# Patient Record
Sex: Male | Born: 1955
Health system: Southern US, Community
[De-identification: ages and names within clinical notes are randomized; demographics above are authoritative.]

## PROBLEM LIST (undated history)

## (undated) DIAGNOSIS — R911 Solitary pulmonary nodule: Secondary | ICD-10-CM

## (undated) DIAGNOSIS — E119 Type 2 diabetes mellitus without complications: Secondary | ICD-10-CM

## (undated) DIAGNOSIS — R7302 Impaired glucose tolerance (oral): Secondary | ICD-10-CM

## (undated) DIAGNOSIS — I214 Non-ST elevation (NSTEMI) myocardial infarction: Secondary | ICD-10-CM

## (undated) DIAGNOSIS — I2510 Atherosclerotic heart disease of native coronary artery without angina pectoris: Secondary | ICD-10-CM

## (undated) DIAGNOSIS — G43909 Migraine, unspecified, not intractable, without status migrainosus: Secondary | ICD-10-CM

## (undated) DIAGNOSIS — I10 Essential (primary) hypertension: Secondary | ICD-10-CM

---

## 2005-08-02 HISTORY — PX: CHOLECYSTECTOMY: SHX55

## 2007-08-04 ENCOUNTER — Observation Stay (HOSPITAL_COMMUNITY): Admission: EM | Admit: 2007-08-04 | Discharge: 2007-08-05 | Payer: Self-pay | Admitting: Emergency Medicine

## 2008-10-04 ENCOUNTER — Ambulatory Visit: Payer: Self-pay | Admitting: Cardiology

## 2008-10-15 ENCOUNTER — Ambulatory Visit: Payer: Self-pay | Admitting: Cardiology

## 2009-09-04 ENCOUNTER — Inpatient Hospital Stay (HOSPITAL_COMMUNITY): Admission: EM | Admit: 2009-09-04 | Discharge: 2009-09-09 | Payer: Self-pay | Admitting: Emergency Medicine

## 2009-09-08 ENCOUNTER — Encounter (INDEPENDENT_AMBULATORY_CARE_PROVIDER_SITE_OTHER): Payer: Self-pay

## 2010-08-02 DIAGNOSIS — I10 Essential (primary) hypertension: Secondary | ICD-10-CM

## 2010-08-02 HISTORY — DX: Essential (primary) hypertension: I10

## 2010-10-21 LAB — LIPID PANEL
Cholesterol: 155 mg/dL (ref 0–200)
Cholesterol: 156 mg/dL (ref 0–200)
HDL: 27 mg/dL — ABNORMAL LOW (ref >39)
LDL Cholesterol: 104 mg/dL — ABNORMAL HIGH (ref 0–99)
Total CHOL/HDL Ratio: 5 RATIO
Total CHOL/HDL Ratio: 5.7 RATIO
VLDL: 25 mg/dL (ref 0–40)

## 2010-10-21 LAB — RAPID URINE DRUG SCREEN, HOSP PERFORMED
Barbiturates: NOT DETECTED
Benzodiazepines: NOT DETECTED
Cocaine: NOT DETECTED
Tetrahydrocannabinol: NOT DETECTED

## 2010-10-21 LAB — CBC
Hemoglobin: 12.1 g/dL — ABNORMAL LOW (ref 13.0–17.0)
MCHC: 33.3 g/dL (ref 30.0–36.0)
MCHC: 33.7 g/dL (ref 30.0–36.0)
MCV: 85.7 fL (ref 78.0–100.0)
MCV: 86.2 fL (ref 78.0–100.0)
Platelets: 191 10*3/uL (ref 150–400)
RBC: 4.16 MIL/uL — ABNORMAL LOW (ref 4.22–5.81)
RBC: 4.6 MIL/uL (ref 4.22–5.81)
RBC: 5.36 MIL/uL (ref 4.22–5.81)
RDW: 14.8 % (ref 11.5–15.5)
WBC: 10.1 10*3/uL (ref 4.0–10.5)
WBC: 8.6 10*3/uL (ref 4.0–10.5)

## 2010-10-21 LAB — PROTIME-INR
INR: 1.05 (ref 0.00–1.49)
Prothrombin Time: 13.6 seconds (ref 11.6–15.2)

## 2010-10-21 LAB — URINALYSIS, ROUTINE W REFLEX MICROSCOPIC
Glucose, UA: NEGATIVE mg/dL
Hgb urine dipstick: NEGATIVE
Ketones, ur: NEGATIVE mg/dL
Protein, ur: NEGATIVE mg/dL
Specific Gravity, Urine: 1.02 (ref 1.005–1.030)
Urobilinogen, UA: 0.2 mg/dL (ref 0.0–1.0)
pH: 5.5 (ref 5.0–8.0)

## 2010-10-21 LAB — COMPREHENSIVE METABOLIC PANEL
AST: 14 U/L (ref 0–37)
Albumin: 3.1 g/dL — ABNORMAL LOW (ref 3.5–5.2)
Albumin: 3.1 g/dL — ABNORMAL LOW (ref 3.5–5.2)
Albumin: 3.6 g/dL (ref 3.5–5.2)
Alkaline Phosphatase: 51 U/L (ref 39–117)
BUN: 1 mg/dL — ABNORMAL LOW (ref 6–23)
BUN: 9 mg/dL (ref 6–23)
CO2: 24 mEq/L (ref 19–32)
CO2: 28 mEq/L (ref 19–32)
Calcium: 8.4 mg/dL (ref 8.4–10.5)
Calcium: 8.8 mg/dL (ref 8.4–10.5)
Chloride: 107 mEq/L (ref 96–112)
Creatinine, Ser: 0.87 mg/dL (ref 0.4–1.5)
Creatinine, Ser: 0.91 mg/dL (ref 0.4–1.5)
GFR calc Af Amer: >60 mL/min (ref >60)
GFR calc non Af Amer: >60 mL/min (ref >60)
GFR calc non Af Amer: >60 mL/min (ref >60)
Glucose, Bld: 104 mg/dL — ABNORMAL HIGH (ref 70–99)
Glucose, Bld: 119 mg/dL — ABNORMAL HIGH (ref 70–99)
Potassium: 4.2 mEq/L (ref 3.5–5.1)
Sodium: 137 mEq/L (ref 135–145)
Total Bilirubin: 0.5 mg/dL (ref 0.3–1.2)
Total Bilirubin: 0.6 mg/dL (ref 0.3–1.2)
Total Protein: 7.4 g/dL (ref 6.0–8.3)

## 2010-10-21 LAB — DIFFERENTIAL
Basophils Absolute: 0 10*3/uL (ref 0.0–0.1)
Basophils Relative: 1 % (ref 0–1)
Eosinophils Absolute: 0.1 10*3/uL (ref 0.0–0.7)
Eosinophils Absolute: 0.2 10*3/uL (ref 0.0–0.7)
Eosinophils Absolute: 0.2 10*3/uL (ref 0.0–0.7)
Eosinophils Relative: 2 % (ref 0–5)
Lymphocytes Relative: 30 % (ref 12–46)
Lymphs Abs: 1.8 10*3/uL (ref 0.7–4.0)
Lymphs Abs: 2 10*3/uL (ref 0.7–4.0)
Monocytes Absolute: 0.6 10*3/uL (ref 0.1–1.0)
Monocytes Absolute: 0.8 10*3/uL (ref 0.1–1.0)
Monocytes Relative: 10 % (ref 3–12)
Monocytes Relative: 8 % (ref 3–12)
Monocytes Relative: 9 % (ref 3–12)
Neutro Abs: 3.6 10*3/uL (ref 1.7–7.7)
Neutro Abs: 5.6 10*3/uL (ref 1.7–7.7)
Neutrophils Relative %: 65 % (ref 43–77)

## 2010-10-21 LAB — BASIC METABOLIC PANEL
CO2: 22 mEq/L (ref 19–32)
Calcium: 8.8 mg/dL (ref 8.4–10.5)
Chloride: 108 mEq/L (ref 96–112)
Chloride: 111 mEq/L (ref 96–112)
Creatinine, Ser: 0.92 mg/dL (ref 0.4–1.5)
GFR calc Af Amer: >60 mL/min (ref >60)
GFR calc Af Amer: >60 mL/min (ref >60)
Potassium: 4.2 mEq/L (ref 3.5–5.1)
Sodium: 135 mEq/L (ref 135–145)
Sodium: 138 mEq/L (ref 135–145)

## 2010-10-21 LAB — PHOSPHORUS: Phosphorus: 3 mg/dL (ref 2.3–4.6)

## 2010-10-21 LAB — HEPATIC FUNCTION PANEL
AST: 43 U/L — ABNORMAL HIGH (ref 0–37)
Albumin: 3 g/dL — ABNORMAL LOW (ref 3.5–5.2)
Alkaline Phosphatase: 51 U/L (ref 39–117)
Total Bilirubin: 0.5 mg/dL (ref 0.3–1.2)

## 2010-10-21 LAB — MAGNESIUM: Magnesium: 1.7 mg/dL (ref 1.5–2.5)

## 2010-10-21 LAB — LIPASE, BLOOD
Lipase: 19 U/L (ref 11–59)
Lipase: 49 U/L (ref 11–59)

## 2010-12-15 NOTE — Discharge Summary (Signed)
NAMEWHEELER, Michael Rivas               ACCOUNT NO.:  [id number]   MEDICAL RECORD NO.:  [id number]          PATIENT TYPE:  OBV   LOCATION:  A223                          FACILITY:  [facility]   PHYSICIAN:  Osvaldo Shipper, MD     DATE OF BIRTH:  [birth year]   DATE OF ADMISSION:  08/04/2007  DATE OF DISCHARGE:  01/03/2009LH                               DISCHARGE SUMMARY   Patient does not have a PMD.   DISCHARGE DIAGNOSIS:  1. Chest pain, ruled out for acute coronary syndrome.  2. Left arm numbness, likely related to cervical radiculopathy.  3. Stage 1 hypertension.   Please see H&P dictated yesterday for details regarding patient's  presenting illness.   BRIEF HOSPITAL COURSE:  This is a 55 year old African-American male who  does not have any medical problems, who does have a family history of  coronary artery disease, who presented with chest pain and left arm  numbness for 3-4 days duration.  The pain was located in the  retrosternal area, and was on and off for 3-4 days.  Patient was  evaluated in the ED, and he was not found to have any EKG abnormalities.  His chest x-ray was unremarkable.  His cardiac markers were negative.  Patient was given 1 dose of nitroglycerine sublingually, which he tells  me today has really helped his pain.  Some features of his pain are  concerning for coronary artery disease, however, patient has ruled out  for acute coronary syndrome.  Repeat EKGs do not show any dynamic  changes.  He does have evidence for Q-waves in V1, V2 and possibly even  in V3.  No acute ST segment changes are appreciated.  His pain this  morning has resolved.  He continues to have some numbness in the left  arm, and he is also having some neck pain.  There is no history of  trauma.  The examination of the neck revealed good range of motion,  though somewhat restricted.  At this time, patient can be discharged  home.  He has been made an appointment with Dr. Maurine Cane from [facility]  Cardiology on Tuesday, January 6th, 11 a.m.  He probably will need to  have a stress test at that time.  I am going to prescribe him  nitroglycerine pills that he can take for chest pain as needed.  He has  been told that if the pain does not resolve with 3 pills q.5 minutes,  then he needs to seek attention immediately. His blood pressure is  reasonably controlled with beta-blockers.  His urine drug screen was  negative.  His lipid profile shows LDL of 89 and HDL of 56. Patient may  need further work-up for his neck pain as an outpatient.   DISCHARGE MEDICATIONS:  1. Nitroglycerine 0.4 mg sublingually q.5 minutes x3 for chest pain.  2. Metoprolol 25 mg p.o. once daily.  3. Aspirin 81 mg p.o. daily.   Follow up with Dr. Maurine Cane on January 6th at 11 a.m.   Other instructions as above.  He needs to have a heart-healthy diet,  increase activity slowly, and he needs to stay off work till he is  evaluated by the cardiologist.   Total time of discharge 35 minutes.      Osvaldo Shipper, MD  Electronically Signed     GK/MEDQ  D:  08/05/2007  T:  08/05/2007  Job:  [id number]   cc:   Noralyn Pick. Eden Emms, MD, [facility]  [address]  [address]  [place]  [place] [address]

## 2010-12-15 NOTE — Letter (Signed)
October 04, 2008    [facility]   RE:  TIWAN, SCHNITKER  MRN:  [id number]  /  DOB:  1955-11-14   Mr. Prusinski returns to the office after a brief admission to [facility] in January of this year for chest discomfort.  Myocardial  infarction was ruled out.  It was felt that his stress test could be  done safely as an outpatient, but he failed to appear for his followup  appointment.  Since that admission, which was the first time he was ever  in the hospital, he has done fine.  He has played some basketball, but  that activity is limited by chronic knee problems.  He has had no  dyspnea.  No chest discomfort.   He does have mild hypertension that is currently treated with metoprolol  25 mg daily.  His only other medicine is aspirin 81 mg daily.  Recent  lipid profile was average.  He continues to smoke cigarettes at the rate  of one-pack per day and a total consumption of 40 pack-years.   PHYSICAL EXAMINATION:  GENERAL:  Youthful appearing tall and thin  gentleman in no acute distress.  VITAL SIGNS:  The weight is 176, blood pressure 130/85, heart rate 65  and regular, respirations 12 and unlabored.  NECK:  No jugular venous distention; no carotid bruits.  LUNGS:  Clear.  CARDIAC:  Normal first and second heart sounds; fourth heart sound  present.  ABDOMEN:  Soft and nontender; no bruits.  EXTREMITIES:  No edema; normal distal pulses.   EKG:  Normal sinus rhythm; delayed R-wave progression; prominent  voltage.  Compared with [facility], 2008, R-wave progression has improved.   IMPRESSION:  Mr. Rudin has had a single episode of chest discomfort,  and does not have impressive cardiovascular risk factors and has an  essentially normal EKG.  The reading of the prior tracing suggesting  anteroseptal infarction.  It is an over reading.  We will proceed with a  treadmill stress test to exclude significant  coronary artery disease.  I  advised Mr. Livers that it was time for him to stop smoking cigarettes.  I suggested that he monitor blood pressure control and seek additional  medication for elevated values.  Please let me know at any time that I  can offer further assistance in the care of this very nice gentleman.    Sincerely,      Gerrit Friends. Dietrich Pates, MD, [facility]  Electronically Signed    RMR/MedQ  DD: 10/04/2008  DT: 10/05/2008  Job #: [id number]

## 2010-12-15 NOTE — H&P (Signed)
NAME:  Michael Rivas, Michael Rivas               ACCOUNT NO.:  [id number]   MEDICAL RECORD NO.:  [id number]          PATIENT TYPE:  OBV   LOCATION:  A223                          FACILITY:  [facility]   PHYSICIAN:  Osvaldo Shipper, MD     DATE OF BIRTH:  [birth year]   DATE OF ADMISSION:  08/04/2007  DATE OF DISCHARGE:  LH                              HISTORY & PHYSICAL   PRIMARY CARE PHYSICIAN:  The patient does not have a PMD.   ADMITTING DIAGNOSIS:  Chest pain, rule out acute coronary syndrome.   CHIEF COMPLAINT:  Chest pain and left arm numbness for the past few  days.   HISTORY OF PRESENT ILLNESS:  This is a 55 year old African American male  who does not have any medical problems who has been having on and off  left arm numbness specifically in the left upper arm as well as in his  left index finger for the past few days.  He has also been noticing on  and off chest pain which he is not able to clearly describe located in  the retrosternal area.  The pain at times is about 4-5 out of 10 in  intensity.  Currently he does not have any chest pain.  This morning  when he woke up he had some chest pain and this was associated with some  dizziness and lightheadedness.  He was also diaphoretic and also had  some shortness of breath.  Denied any palpitations, nausea, vomiting.  No fever, no cough was present.   The pain onset was at rest mostly.  He has also had some neck pain since  this morning.  He denies similar complaints in the past.  Denies any leg  swelling.   HOME MEDICATIONS:  None.   ALLERGIES:  None.   PAST MEDICAL HISTORY:  Really unremarkable.  No surgeries in the past.   SOCIAL HISTORY:  Lives in [place].  He admits to smoking a pack of  cigarettes on a daily basis.  Has almost 30 pack year history of  smoking.  Denies any illicit drug use.  Consumes one or two beers every  other day.  Denies any liquor use.   FAMILY HISTORY:  Positive for father who died of a massive heart  attack  at the age of 12.  Mother had a history of stroke.  He has 4 brothers  and 3 sisters and they do not have any medical problems that he is aware  of.   PHYSICAL EXAMINATION:  VITAL SIGNS:  When he came into the ED his  temperature was 97.4, blood pressure 150/103, heart rate 84, respiratory  rate 18, saturation 98% on room air.  GENERAL:  This is a thin African American male in no distress.  HEENT:  There is no pallor, no icterus.  Oral mucosa membranes are  moist.  No oral lesions are noted.  NECK:  Supple.  Soft.  No thyromegaly is appreciated.  LUNGS:  Clear to auscultation bilaterally.  CARDIOVASCULAR:  S1, S2 is normal.  Regular.  No murmurs appreciated.  No S3, S4.  No  rubs, no bruits heard.  ABDOMEN:  Soft, nontender, nondistended.  Bowel sounds are present.  No  mass or organomegaly is appreciated.  EXTREMITIES:  Without edema.  Peripheral pulses palpable.  Left arm,  there was no loss of strength.  No focal neurological deficits.  Peripheral pulses are present.  Examination of the neck does not reveal  or reproduce any pain.  Palpation of the chest does not reproduce any  pain.   REVIEW OF SYSTEMS:  GENERAL:  This is positive for weakness.  HEENT:  Unremarkable.  CARDIOVASCULAR:  As in HPI.  RESPIRATORY:  As in HPI.  GI:  Positive for heartburn occasionally.  GU:  Unremarkable.  NEUROLOGICAL:  Positive for numbness of the left index finger.  Other  systems unremarkable.   LAB DATA:  His CBC is unremarkable.  D-dimer normal.  BMET unremarkable.  Cardiac markers so far unremarkable.  EKG shows a sinus rhythm with a  normal access.  Intervals appear to be in the normal range.  I do not  appreciate any definite, well there could be possibility of a Q wave in  V1, V2.  No ST changes concerning for ischemia are noticed.  No T wave  changes noted.   Chest x-ray was reported as being normal.  I cannot review the film at  this time.   ASSESSMENT:  This is a 55 year old  black male with no real medical  problems who presents with chest pain.  His blood pressure is a little  bit elevated although it did normalize later on.  He is a smoker.  He  has a family history of heart disease though it is not premature family  history of heart disease.  So the patient has some of these symptoms  which are somewhat concerning for coronary artery disease.  The  differential includes cervical spinal problems which could be causing  numbness in his left hand, acid reflux is the other etiology.   PLAN:  1. Chest pain.  We will observe him in the hospital and rule him out      for acute coronary syndrome.  Do serial EKGs.  Once he is ruled out      he will be discharged home to follow up with a cardiologist.  I      have already made him an appointment on January 6 at 11 a.m. with      Dr. Eden Emms at [facility] cardiology.  If on the other hand his EKG      comes abnormal or if he rules in for acute coronary syndrome a      cardiology consult will be obtained in hospital.  2. Lipid profile will be ordered.  The patient will be monitored on      telemetry.      Osvaldo Shipper, MD  Electronically Signed     GK/MEDQ  D:  08/04/2007  T:  08/04/2007  Job:  [id number]   cc:   Noralyn Pick. Eden Emms, MD, [facility]  [address]  [address]  [place]  [place] [address]

## 2011-04-22 LAB — POCT CARDIAC MARKERS
CKMB, poc: 2.2
Operator id: 213721
Operator id: 213931
Troponin i, poc: <0.05

## 2011-04-22 LAB — BASIC METABOLIC PANEL
BUN: 8
CO2: 25
Calcium: 9
Creatinine, Ser: 0.87
Glucose, Bld: 111 — ABNORMAL HIGH

## 2011-04-22 LAB — LIPID PANEL
Cholesterol: 181
HDL: 56
Total CHOL/HDL Ratio: 3.2

## 2011-04-22 LAB — RAPID URINE DRUG SCREEN, HOSP PERFORMED
Amphetamines: NOT DETECTED
Benzodiazepines: NOT DETECTED
Tetrahydrocannabinol: NOT DETECTED

## 2011-04-22 LAB — CARDIAC PANEL(CRET KIN+CKTOT+MB+TROPI)
Relative Index: 1.3
Total CK: 191
Troponin I: 0.01
Troponin I: 0.02
Troponin I: 0.02

## 2011-04-22 LAB — CBC
MCHC: 34.1
RDW: 15.8 — ABNORMAL HIGH

## 2011-04-22 LAB — DIFFERENTIAL
Basophils Absolute: 0
Eosinophils Absolute: 0.1
Lymphocytes Relative: 20
Neutro Abs: 7.8 — ABNORMAL HIGH
Neutrophils Relative %: 74

## 2011-04-22 LAB — LIPASE, BLOOD: Lipase: 19

## 2012-09-07 ENCOUNTER — Other Ambulatory Visit (HOSPITAL_COMMUNITY): Payer: Self-pay | Admitting: Internal Medicine

## 2012-09-07 ENCOUNTER — Ambulatory Visit (HOSPITAL_COMMUNITY)
Admission: RE | Admit: 2012-09-07 | Discharge: 2012-09-07 | Disposition: A | Discharge status: Home / Self Care | Payer: BC Managed Care – PPO | Source: Ambulatory Visit | Attending: Internal Medicine | Admitting: Internal Medicine

## 2012-09-07 DIAGNOSIS — Z0000 Encounter for general adult medical examination without abnormal findings: Secondary | ICD-10-CM

## 2012-09-07 DIAGNOSIS — R059 Cough, unspecified: Secondary | ICD-10-CM | POA: Insufficient documentation

## 2012-09-07 DIAGNOSIS — F17200 Nicotine dependence, unspecified, uncomplicated: Secondary | ICD-10-CM | POA: Insufficient documentation

## 2012-09-07 DIAGNOSIS — R05 Cough: Secondary | ICD-10-CM | POA: Insufficient documentation

## 2012-09-13 ENCOUNTER — Other Ambulatory Visit (HOSPITAL_COMMUNITY): Payer: Self-pay | Admitting: Internal Medicine

## 2012-09-13 DIAGNOSIS — R05 Cough: Secondary | ICD-10-CM

## 2012-09-19 ENCOUNTER — Ambulatory Visit (HOSPITAL_COMMUNITY)
Admission: RE | Admit: 2012-09-19 | Discharge: 2012-09-19 | Disposition: A | Discharge status: Home / Self Care | Payer: BC Managed Care – PPO | Source: Ambulatory Visit | Attending: Internal Medicine | Admitting: Internal Medicine

## 2012-09-19 DIAGNOSIS — J4489 Other specified chronic obstructive pulmonary disease: Secondary | ICD-10-CM | POA: Insufficient documentation

## 2012-09-19 DIAGNOSIS — J449 Chronic obstructive pulmonary disease, unspecified: Secondary | ICD-10-CM | POA: Insufficient documentation

## 2012-09-19 DIAGNOSIS — R059 Cough, unspecified: Secondary | ICD-10-CM | POA: Insufficient documentation

## 2012-09-19 DIAGNOSIS — R05 Cough: Secondary | ICD-10-CM | POA: Insufficient documentation

## 2012-09-21 ENCOUNTER — Telehealth: Payer: Self-pay

## 2012-09-21 NOTE — Telephone Encounter (Signed)
Pt referred by Dr. Regino Schultze for screening colonoscopy. Tried to call, many rings and no answer.

## 2012-09-26 ENCOUNTER — Telehealth: Payer: Self-pay

## 2012-09-26 NOTE — Telephone Encounter (Signed)
Pt will call when ready to schedule colonoscopy. No problems at this time and no family history of colon cancer. Letter to PCP.

## 2012-09-26 NOTE — Telephone Encounter (Signed)
Called pt to triage. He said he wants to wait awhile because he is so busy at work at this time. He will call when he is ready. He is not having any problems and no family history of colon cancer. Sending letter to PCP.

## 2012-10-10 ENCOUNTER — Emergency Department (HOSPITAL_COMMUNITY): Payer: BC Managed Care – PPO

## 2012-10-10 ENCOUNTER — Emergency Department (HOSPITAL_COMMUNITY)
Admission: EM | Admit: 2012-10-10 | Discharge: 2012-10-10 | Disposition: A | Discharge status: Home / Self Care | Payer: BC Managed Care – PPO | Attending: Emergency Medicine | Admitting: Emergency Medicine

## 2012-10-10 ENCOUNTER — Encounter (HOSPITAL_COMMUNITY): Payer: Self-pay | Admitting: Emergency Medicine

## 2012-10-10 DIAGNOSIS — K5289 Other specified noninfective gastroenteritis and colitis: Secondary | ICD-10-CM | POA: Insufficient documentation

## 2012-10-10 DIAGNOSIS — R112 Nausea with vomiting, unspecified: Secondary | ICD-10-CM | POA: Insufficient documentation

## 2012-10-10 DIAGNOSIS — R059 Cough, unspecified: Secondary | ICD-10-CM | POA: Insufficient documentation

## 2012-10-10 DIAGNOSIS — Z9089 Acquired absence of other organs: Secondary | ICD-10-CM | POA: Insufficient documentation

## 2012-10-10 DIAGNOSIS — R05 Cough: Secondary | ICD-10-CM | POA: Insufficient documentation

## 2012-10-10 DIAGNOSIS — F17200 Nicotine dependence, unspecified, uncomplicated: Secondary | ICD-10-CM | POA: Insufficient documentation

## 2012-10-10 DIAGNOSIS — I10 Essential (primary) hypertension: Secondary | ICD-10-CM | POA: Insufficient documentation

## 2012-10-10 DIAGNOSIS — K529 Noninfective gastroenteritis and colitis, unspecified: Secondary | ICD-10-CM

## 2012-10-10 DIAGNOSIS — R197 Diarrhea, unspecified: Secondary | ICD-10-CM | POA: Insufficient documentation

## 2012-10-10 DIAGNOSIS — R51 Headache: Secondary | ICD-10-CM | POA: Insufficient documentation

## 2012-10-10 DIAGNOSIS — R109 Unspecified abdominal pain: Secondary | ICD-10-CM | POA: Insufficient documentation

## 2012-10-10 DIAGNOSIS — E860 Dehydration: Secondary | ICD-10-CM | POA: Insufficient documentation

## 2012-10-10 HISTORY — DX: Essential (primary) hypertension: I10

## 2012-10-10 LAB — COMPREHENSIVE METABOLIC PANEL
ALT: 9 U/L (ref 0–53)
AST: 16 U/L (ref 0–37)
Albumin: 3.2 g/dL — ABNORMAL LOW (ref 3.5–5.2)
Calcium: 9.1 mg/dL (ref 8.4–10.5)
Chloride: 92 mEq/L — ABNORMAL LOW (ref 96–112)
Creatinine, Ser: 1.14 mg/dL (ref 0.50–1.35)
Sodium: 127 mEq/L — ABNORMAL LOW (ref 135–145)

## 2012-10-10 LAB — CBC WITH DIFFERENTIAL/PLATELET
Eosinophils Absolute: 0 10*3/uL (ref 0.0–0.7)
Eosinophils Relative: 0 % (ref 0–5)
HCT: 41.8 % (ref 39.0–52.0)
Hemoglobin: 14.8 g/dL (ref 13.0–17.0)
Lymphs Abs: 1.3 10*3/uL (ref 0.7–4.0)
MCH: 29.5 pg (ref 26.0–34.0)
MCV: 83.3 fL (ref 78.0–100.0)
Monocytes Absolute: 1 10*3/uL (ref 0.1–1.0)
Monocytes Relative: 9 % (ref 3–12)
RBC: 5.02 MIL/uL (ref 4.22–5.81)

## 2012-10-10 LAB — LACTIC ACID, PLASMA: Lactic Acid, Venous: 1 mmol/L (ref 0.5–2.2)

## 2012-10-10 MED ORDER — CIPROFLOXACIN IN D5W 400 MG/200ML IV SOLN
400.0000 mg | Freq: Once | INTRAVENOUS | Status: AC
  Administered 2012-10-10: 400 mg via INTRAVENOUS
  Filled 2012-10-10: qty 200

## 2012-10-10 MED ORDER — MORPHINE SULFATE 4 MG/ML IJ SOLN
4.0000 mg | Freq: Once | INTRAMUSCULAR | Status: AC
  Administered 2012-10-10: 4 mg via INTRAVENOUS
  Filled 2012-10-10: qty 1

## 2012-10-10 MED ORDER — METRONIDAZOLE IN NACL 5-0.79 MG/ML-% IV SOLN
500.0000 mg | Freq: Once | INTRAVENOUS | Status: AC
  Administered 2012-10-10: 500 mg via INTRAVENOUS
  Filled 2012-10-10: qty 100

## 2012-10-10 MED ORDER — OXYCODONE-ACETAMINOPHEN 5-325 MG PO TABS: 1.0000 | ORAL_TABLET | ORAL | Status: DC | PRN

## 2012-10-10 MED ORDER — METRONIDAZOLE 500 MG PO TABS: 500.0000 mg | ORAL_TABLET | Freq: Two times a day (BID) | ORAL | Status: DC

## 2012-10-10 MED ORDER — SODIUM CHLORIDE 0.9 % IV SOLN
Freq: Once | INTRAVENOUS | Status: AC
  Administered 2012-10-10: 11:00:00 via INTRAVENOUS

## 2012-10-10 MED ORDER — SODIUM CHLORIDE 0.9 % IV BOLUS (SEPSIS): 1000.0000 mL | Freq: Once | INTRAVENOUS | Status: DC

## 2012-10-10 MED ORDER — CIPROFLOXACIN HCL 500 MG PO TABS: 500.0000 mg | ORAL_TABLET | Freq: Two times a day (BID) | ORAL | Status: DC

## 2012-10-10 MED ORDER — ONDANSETRON HCL 4 MG/2ML IJ SOLN
4.0000 mg | Freq: Once | INTRAMUSCULAR | Status: AC
  Administered 2012-10-10: 4 mg via INTRAVENOUS
  Filled 2012-10-10: qty 2

## 2012-10-10 MED ORDER — OXYCODONE-ACETAMINOPHEN 5-325 MG PO TABS
2.0000 | ORAL_TABLET | Freq: Once | ORAL | Status: AC
  Administered 2012-10-10: 2 via ORAL
  Filled 2012-10-10: qty 2

## 2012-10-10 MED ORDER — IOHEXOL 300 MG/ML  SOLN
100.0000 mL | Freq: Once | INTRAMUSCULAR | Status: AC | PRN
  Administered 2012-10-10: 100 mL via INTRAVENOUS

## 2012-10-10 MED ORDER — IOHEXOL 300 MG/ML  SOLN
50.0000 mL | Freq: Once | INTRAMUSCULAR | Status: AC | PRN
  Administered 2012-10-10: 50 mL via ORAL

## 2012-10-10 NOTE — ED Notes (Signed)
Patient with no complaints at this time. Respirations even and unlabored. Skin warm/dry. Discharge instructions reviewed with patient at this time. Patient given opportunity to voice concerns/ask questions. IV removed per policy and band-aid applied to site. Patient discharged at this time and left Emergency Department with steady gait.  

## 2012-10-10 NOTE — ED Provider Notes (Signed)
History  This chart was scribed for Michael Gaskins, MD by Bennett Scrape, ED Scribe. This patient was seen in room APA04/APA04 and the patient's care was started at 10:34 AM.  CSN: [id number]  Arrival date & time 10/10/12  1012   First MD Initiated Contact with Patient 10/10/12 1034      Chief Complaint  Patient presents with  . Emesis  . Diarrhea     Patient is a 57 y.o. male presenting with abdominal pain. The history is provided by the patient. No language interpreter was used.  Abdominal Pain Pain location:  LLQ Pain quality: aching   Pain radiates to:  LUQ Onset quality:  Gradual Duration:  3 days Timing:  Constant Progression:  Worsening Chronicity:  New Context: not alcohol use and not previous surgeries   Associated symptoms: cough, diarrhea, nausea and vomiting   Associated symptoms: no chest pain, no chills, no dysuria, no fever and no shortness of breath     [name] is a 57 y.o. male who presents to the Emergency Department complaining of 3 days of gradual onset, gradually worsening, constant LLQ abdominal pain that radiates into the LUQ described as aching with associated HAs, cough, nausea, emesis and diarrhea. He reports recent antibiotic use for a sinus infection but denies fevers, CP, hematemesis and hematochezia as associated symptoms. He has a h/o HTN which he takes daily medications for. He is a current everyday smoker and occasional alcohol user but denies any recent alcohol use.  Past Medical History  Diagnosis Date  . Hypertension     Past Surgical History  Procedure Laterality Date  . Cholecystectomy      No family history on file.  History  Substance Use Topics  . Smoking status: Current Every Day Smoker -- 1.00 packs/day    Types: Cigarettes  . Smokeless tobacco: Not on file  . Alcohol Use: Yes     Comment: social      Review of Systems  Constitutional: Negative for fever and chills.  Respiratory: Positive for cough.  Negative for shortness of breath.   Cardiovascular: Negative for chest pain.  Gastrointestinal: Positive for nausea, vomiting, abdominal pain and diarrhea. Negative for blood in stool.  Genitourinary: Negative for dysuria, frequency and testicular pain.  Neurological: Positive for headaches. Negative for weakness and numbness.  Psychiatric/Behavioral: Negative for agitation.  All other systems reviewed and are negative.    Allergies  Review of patient's allergies indicates no known allergies.  Home Medications  No current outpatient prescriptions on file.  Triage Vitals: BP 132/84  Pulse 106  Temp(Src) 98.6 F (37 C)  Resp 18  Ht 6\' 3"  (1.905 m)  Wt 170 lb (77.111 kg)  BMI 21.25 kg/m2  SpO2 96% BP 129/73  Pulse 83  Temp(Src) 98.6 F (37 C)  Resp 16  Ht 6\' 3"  (1.905 m)  Wt 170 lb (77.111 kg)  BMI 21.25 kg/m2  SpO2 94%   Physical Exam  Nursing note and vitals reviewed.  CONSTITUTIONAL: Well developed/well nourished HEAD: Normocephalic/atraumatic EYES: EOMI/PERRL ENMT: Mucous membranes moist NECK: supple no meningeal signs SPINE:entire spine nontender CV: S1/S2 noted, no murmurs/rubs/gallops noted LUNGS: Lungs are clear to auscultation bilaterally, no apparent distress ABDOMEN: soft, tenderness to the LLQ and LUQ, no rebound or guarding GU:no cva tenderness NEURO: Pt is awake/alert, moves all extremitiesx4 EXTREMITIES: pulses normal, full ROM SKIN: warm, color normal PSYCH: no abnormalities of mood noted  ED Course  Procedures   DIAGNOSTIC STUDIES: Oxygen Saturation is 96%  on room air, adequate by my interpretation.    COORDINATION OF CARE: 10:56 AM-Discussed treatment plan which includes IV fluids, pain medications, and CBC panel with pt at bedside and pt agreed to plan.   11:00 AM- Ordered 4 mg morphine and 4 mg Zofran   12:36 PM Pt still with RLQ abdominal tenderness - will get ct imaging Pt currently otherwise stable  2:39 PM Colitis noted No  signs of appendicitis Pt is clinically well appearing.  He is dehydrated though suspect this is related to his recent vomiting/diarrhea He is able to take PO Will need GI followup   MDM  Nursing notes including past medical history and social history reviewed and considered in documentation Labs/vital reviewed and considered   I personally performed the services described in this documentation, which was scribed in my presence. The recorded information has been reviewed and is accurate.         Michael Gaskins, MD 10/10/12 1440

## 2012-10-10 NOTE — ED Notes (Addendum)
Bilateral lower quadrant cramping abdominal pain. Diarrhea 6+ x daily for 3 days. No dysuria.

## 2012-10-10 NOTE — ED Notes (Signed)
Pt c/o abd pain with n/v/d x 3 days. Also c/o ha today.

## 2013-08-13 ENCOUNTER — Emergency Department (HOSPITAL_COMMUNITY): Payer: 59

## 2013-08-13 ENCOUNTER — Encounter (HOSPITAL_COMMUNITY): Payer: Self-pay | Admitting: Emergency Medicine

## 2013-08-13 ENCOUNTER — Emergency Department (HOSPITAL_COMMUNITY)
Admission: EM | Admit: 2013-08-13 | Discharge: 2013-08-13 | Disposition: A | Discharge status: Home / Self Care | Payer: 59 | Attending: Emergency Medicine | Admitting: Emergency Medicine

## 2013-08-13 DIAGNOSIS — M25519 Pain in unspecified shoulder: Secondary | ICD-10-CM | POA: Insufficient documentation

## 2013-08-13 DIAGNOSIS — F17200 Nicotine dependence, unspecified, uncomplicated: Secondary | ICD-10-CM | POA: Insufficient documentation

## 2013-08-13 DIAGNOSIS — R634 Abnormal weight loss: Secondary | ICD-10-CM | POA: Insufficient documentation

## 2013-08-13 DIAGNOSIS — R079 Chest pain, unspecified: Secondary | ICD-10-CM | POA: Insufficient documentation

## 2013-08-13 DIAGNOSIS — R197 Diarrhea, unspecified: Secondary | ICD-10-CM | POA: Insufficient documentation

## 2013-08-13 DIAGNOSIS — Z79899 Other long term (current) drug therapy: Secondary | ICD-10-CM | POA: Insufficient documentation

## 2013-08-13 DIAGNOSIS — I10 Essential (primary) hypertension: Secondary | ICD-10-CM | POA: Insufficient documentation

## 2013-08-13 DIAGNOSIS — R05 Cough: Secondary | ICD-10-CM | POA: Insufficient documentation

## 2013-08-13 DIAGNOSIS — Z792 Long term (current) use of antibiotics: Secondary | ICD-10-CM | POA: Insufficient documentation

## 2013-08-13 DIAGNOSIS — Z7982 Long term (current) use of aspirin: Secondary | ICD-10-CM | POA: Insufficient documentation

## 2013-08-13 DIAGNOSIS — R059 Cough, unspecified: Secondary | ICD-10-CM | POA: Insufficient documentation

## 2013-08-13 DIAGNOSIS — R0602 Shortness of breath: Secondary | ICD-10-CM | POA: Insufficient documentation

## 2013-08-13 DIAGNOSIS — R209 Unspecified disturbances of skin sensation: Secondary | ICD-10-CM | POA: Insufficient documentation

## 2013-08-13 DIAGNOSIS — E119 Type 2 diabetes mellitus without complications: Secondary | ICD-10-CM | POA: Insufficient documentation

## 2013-08-13 HISTORY — DX: Type 2 diabetes mellitus without complications: E11.9

## 2013-08-13 LAB — BASIC METABOLIC PANEL
BUN: 9 mg/dL (ref 6–23)
CALCIUM: 9.5 mg/dL (ref 8.4–10.5)
CHLORIDE: 98 meq/L (ref 96–112)
CO2: 20 meq/L (ref 19–32)
Creatinine, Ser: 0.91 mg/dL (ref 0.50–1.35)
GFR calc non Af Amer: >90 mL/min (ref >90)
Glucose, Bld: 99 mg/dL (ref 70–99)
Potassium: 4.5 mEq/L (ref 3.7–5.3)
SODIUM: 135 meq/L — AB (ref 137–147)

## 2013-08-13 LAB — TROPONIN I

## 2013-08-13 LAB — CBC
HCT: 41.9 % (ref 39.0–52.0)
Hemoglobin: 15.1 g/dL (ref 13.0–17.0)
MCH: 30.9 pg (ref 26.0–34.0)
MCHC: 36 g/dL (ref 30.0–36.0)
MCV: 85.9 fL (ref 78.0–100.0)
PLATELETS: 270 10*3/uL (ref 150–400)
RBC: 4.88 MIL/uL (ref 4.22–5.81)
RDW: 14.5 % (ref 11.5–15.5)
WBC: 8.9 10*3/uL (ref 4.0–10.5)

## 2013-08-13 MED ORDER — GI COCKTAIL ~~LOC~~
30.0000 mL | Freq: Once | ORAL | Status: AC
  Administered 2013-08-13: 30 mL via ORAL
  Filled 2013-08-13: qty 30

## 2013-08-13 MED ORDER — AZITHROMYCIN 250 MG PO TABS: 250.0000 mg | ORAL_TABLET | Freq: Every day | ORAL | Status: DC

## 2013-08-13 MED ORDER — HYDROCODONE-ACETAMINOPHEN 5-325 MG PO TABS
2.0000 | ORAL_TABLET | ORAL | Status: DC | PRN
Start: 2013-08-13 — End: 2013-10-14

## 2013-08-13 NOTE — ED Provider Notes (Signed)
CSN: [id number]     Arrival date & time 08/13/13  0957 History   First MD Initiated Contact with Patient 08/13/13 1036 This chart was scribed for Michael PorterMark Ocia Simek, MD by Valera CastleSteven Perry, ED Scribe. This patient was seen in room APA18/APA18 and the patient's care was started at 3:46 PM.      Chief Complaint  Patient presents with  . Numbness  . Chest Pain    The history is provided by the patient. No language interpreter was used.   HPI Comments: Michael Rivas is a 58 y.o. male who presents to the Emergency Department complaining of constant, heavy, breath taking, substernal chest pain, with associated left arm numbness that radiates down to his fingers, onset 7:00 AM this morning while heading to work. He reports all day yesterday he experienced waxing and waning chest pain. He reports he has had gradually worsening, intermittent, chest pain for 5-6 months. He denies any heavy lifting at work. He reports associated SOB with his chest pain. He states he has chronic cough, due to h/o smoking. He reports being able to move his left arm and fingers normally. He reports associated intermittent left shoulder pain. He denies pain in his neck on a regular basis. He reports an episode of diarrhea today. He reports he has been eating and drinking normally.   He reports h/o DM and HTN. He reports he has been losing weight, 5-6 lbs. He denies being put on medication for his BP. He denies h/o MI, heart disease. He reports his father died from MI at age 56. He denies his brothers and sisters having heart disease. He states his mother has h/o DM. He states a while ago he was put on Aspirin, after coming to ED for chest pain. He denies nausea, vomiting, any other extremity pain and swelling, and any other associated symptoms.   PCP - Gracelyn NurseSUAREZ,J JONATHAN, PA-C  Past Medical History  Diagnosis Date  . Hypertension   . Diabetes mellitus without complication    Past Surgical History  Procedure Laterality Date  .  Cholecystectomy     No family history on file. History  Substance Use Topics  . Smoking status: Current Every Day Smoker -- 1.00 packs/day    Types: Cigarettes  . Smokeless tobacco: Not on file  . Alcohol Use: Yes     Comment: social    Review of Systems  Constitutional: Negative for fever, chills, diaphoresis, appetite change and fatigue.  HENT: Negative for mouth sores, sore throat and trouble swallowing.   Eyes: Negative for visual disturbance.  Respiratory: Positive for cough and shortness of breath. Negative for wheezing.   Cardiovascular: Positive for chest pain. Negative for leg swelling.  Gastrointestinal: Positive for diarrhea. Negative for nausea, vomiting, abdominal pain and abdominal distention.  Endocrine: Negative for polydipsia, polyphagia and polyuria.  Genitourinary: Negative for dysuria, frequency and hematuria.  Musculoskeletal: Positive for arthralgias (left shoulder pain). Negative for gait problem, joint swelling, myalgias and neck pain.  Skin: Negative for color change, pallor and rash.  Neurological: Positive for numbness (left arm, hand). Negative for dizziness, syncope, light-headedness and headaches.  Hematological: Does not bruise/bleed easily.  Psychiatric/Behavioral: Negative for behavioral problems and confusion.    Allergies  Review of patient's allergies indicates no known allergies.  Home Medications   Current Outpatient Rx  Name  Route  Sig  Dispense  Refill  . aspirin EC 81 MG tablet   Oral   Take 81 mg by mouth daily.         .Marland Kitchen  carbamide peroxide (DEBROX) 6.5 % otic solution   Left Ear   Place 5 drops into the left ear 2 (two) times daily as needed (cleaning ear out).         . metoprolol tartrate (LOPRESSOR) 25 MG tablet   Oral   Take 25 mg by mouth daily.          Marland Kitchen azithromycin (ZITHROMAX Z-PAK) 250 MG tablet   Oral   Take 1 tablet (250 mg total) by mouth daily.   6 tablet   0   . HYDROcodone-acetaminophen  (NORCO/VICODIN) 5-325 MG per tablet   Oral   Take 2 tablets by mouth every 4 (four) hours as needed.   10 tablet   0    BP 146/82  Pulse 88  Temp(Src) 97.8 F (36.6 C) (Oral)  Resp 17  Ht 6\' 3"  (1.905 m)  Wt 165 lb (74.844 kg)  BMI 20.62 kg/m2  SpO2 100%  Physical Exam  Constitutional: He is oriented to person, place, and time. He appears well-developed and well-nourished. No distress.  HENT:  Head: Normocephalic.  Mouth/Throat: Oropharynx is clear and moist.  Eyes: Conjunctivae and EOM are normal. Pupils are equal, round, and reactive to light. No scleral icterus.  Neck: Normal range of motion. Neck supple. No thyromegaly present.  Cardiovascular: Normal rate, regular rhythm, normal heart sounds and intact distal pulses.  Exam reveals no gallop and no friction rub.   No murmur heard. Pulmonary/Chest: Effort normal and breath sounds normal. No respiratory distress. He has no wheezes. He has no rales.  Abdominal: Soft. Bowel sounds are normal. He exhibits no distension. There is no tenderness. There is no rebound.  Musculoskeletal: Normal range of motion.  Lymphadenopathy:    He has no cervical adenopathy.  Neurological: He is alert and oriented to person, place, and time. No cranial nerve deficit. He exhibits normal muscle tone. Coordination normal.  Reproducible numbness over left index finger. Nl strength, color, cap refill. Normal wave form with pulse oximetry.   Skin: Skin is warm and dry. No rash noted.  Psychiatric: He has a normal mood and affect. His behavior is normal.    ED Course  Procedures (including critical care time)  DIAGNOSTIC STUDIES: Oxygen Saturation is 100% on room air, normal by my interpretation.    COORDINATION OF CARE: 11:13 AM-Discussed treatment plan with pt at bedside and pt agreed to plan.    Results for orders placed during the hospital encounter of 08/13/13  CBC      Result Value Range   WBC 8.9  4.0 - 10.5 K/uL   RBC 4.88  4.22 -  5.81 MIL/uL   Hemoglobin 15.1  13.0 - 17.0 g/dL   HCT 56.2  13.0 - 86.5 %   MCV 85.9  78.0 - 100.0 fL   MCH 30.9  26.0 - 34.0 pg   MCHC 36.0  30.0 - 36.0 g/dL   RDW 78.4  69.6 - 29.5 %   Platelets 270  150 - 400 K/uL  BASIC METABOLIC PANEL      Result Value Range   Sodium 135 (*) 137 - 147 mEq/L   Potassium 4.5  3.7 - 5.3 mEq/L   Chloride 98  96 - 112 mEq/L   CO2 20  19 - 32 mEq/L   Glucose, Bld 99  70 - 99 mg/dL   BUN 9  6 - 23 mg/dL   Creatinine, Ser 2.84  0.50 - 1.35 mg/dL   Calcium 9.5  8.4 -  10.5 mg/dL   GFR calc non Af Amer >90  >90 mL/min   GFR calc Af Amer >90  >90 mL/min  TROPONIN I      Result Value Range   Troponin I <0.30  <0.30 ng/mL  TROPONIN I      Result Value Range   Troponin I <0.30  <0.30 ng/mL   Dg Chest 2 View  08/13/2013   CLINICAL DATA:  Chest pain and chronic cough  EXAM: CHEST  2 VIEW  COMPARISON:  September 19, 2012  FINDINGS: There is underlying emphysematous change. There is no edema or consolidation. Heart size is normal. Pulmonary vascularity reflects underlying emphysema. No adenopathy. No bone lesions.  IMPRESSION: Underlying emphysematous change.  No edema or consolidation.   Electronically Signed   By: Bretta Bang M.D.   On: 08/13/2013 10:31     EKG Interpretation    Date/Time:  Monday August 13 2013 10:05:08 EST Ventricular Rate:  77 PR Interval:  128 QRS Duration: 86 QT Interval:  366 QTC Calculation: 414 R Axis:   90 Text Interpretation:  Normal sinus rhythm Rightward axis Anteroseptal infarct (cited on or before 04-Aug-2007) Abnormal ECG Confirmed by Fayrene Fearing  MD, Marykathryn Carboni ([id number]) on 08/13/2013 11:21:06 AM           Medications  gi cocktail (Maalox,Lidocaine,Donnatal) (30 mLs Oral Given 08/13/13 1357)    MDM   1. Cough   2. Chest pain    Serial markers are negative EKG shows no acute abnormalities. His symptoms are not typically anginal. It is sharp. Normal studies after 36 hours of symptoms. I don't think this represents  angina or acute myocardial infarction. With this cough and other symptoms and asked him to stop smoking. Plan will be symptomatic treatment Vicodin for pain. Zithromax. [facility] for  followup. Recheck here if any worsening symptoms.   I personally performed the services described in this documentation, which was scribed in my presence. The recorded information has been reviewed and is accurate.    Michael Porter, MD 08/13/13 [phone]

## 2013-08-13 NOTE — ED Notes (Signed)
Left arm and hand numbness x 1 mo. Intermittent substernal pain, non radiating. Chronic cough. NAD.

## 2013-08-13 NOTE — Discharge Instructions (Signed)
Return here with any worsening symptoms. Call [facility] for out patient appointment.  Chest Pain (Nonspecific) It is often hard to give a specific diagnosis for the cause of chest pain. There is always a chance that your pain could be related to something serious, such as a heart attack or a blood clot in the lungs. You need to follow up with your caregiver for further evaluation. CAUSES   Heartburn.  Pneumonia or bronchitis.  Anxiety or stress.  Inflammation around your heart (pericarditis) or lung (pleuritis or pleurisy).  A blood clot in the lung.  A collapsed lung (pneumothorax). It can develop suddenly on its own (spontaneous pneumothorax) or from injury (trauma) to the chest.  Shingles infection (herpes zoster virus). The chest wall is composed of bones, muscles, and cartilage. Any of these can be the source of the pain.  The bones can be bruised by injury.  The muscles or cartilage can be strained by coughing or overwork.  The cartilage can be affected by inflammation and become sore (costochondritis). DIAGNOSIS  Lab tests or other studies, such as X-rays, electrocardiography, stress testing, or cardiac imaging, may be needed to find the cause of your pain.  TREATMENT   Treatment depends on what may be causing your chest pain. Treatment may include:  Acid blockers for heartburn.  Anti-inflammatory medicine.  Pain medicine for inflammatory conditions.  Antibiotics if an infection is present.  You may be advised to change lifestyle habits. This includes stopping smoking and avoiding alcohol, caffeine, and chocolate.  You may be advised to keep your head raised (elevated) when sleeping. This reduces the chance of acid going backward from your stomach into your esophagus.  Most of the time, nonspecific chest pain will improve within 2 to 3 days with rest and mild pain medicine. HOME CARE INSTRUCTIONS   If antibiotics were prescribed, take your antibiotics  as directed. Finish them even if you start to feel better.  For the next few days, avoid physical activities that bring on chest pain. Continue physical activities as directed.  Do not smoke.  Avoid drinking alcohol.  Only take over-the-counter or prescription medicine for pain, discomfort, or fever as directed by your caregiver.  Follow your caregiver's suggestions for further testing if your chest pain does not go away.  Keep any follow-up appointments you made. If you do not go to an appointment, you could develop lasting (chronic) problems with pain. If there is any problem keeping an appointment, you must call to reschedule. SEEK MEDICAL CARE IF:   You think you are having problems from the medicine you are taking. Read your medicine instructions carefully.  Your chest pain does not go away, even after treatment.  You develop a rash with blisters on your chest. SEEK IMMEDIATE MEDICAL CARE IF:   You have increased chest pain or pain that spreads to your arm, neck, jaw, back, or abdomen.  You develop shortness of breath, an increasing cough, or you are coughing up blood.  You have severe back or abdominal pain, feel nauseous, or vomit.  You develop severe weakness, fainting, or chills.  You have a fever. THIS IS AN EMERGENCY. Do not wait to see if the pain will go away. Get medical help at once. Call your local emergency services (911 in [place] Do not drive yourself to the hospital. MAKE SURE YOU:   Understand these instructions.  Will watch your condition.  Will get help right away if you are not doing well or get worse.  Document Released: 04/28/2005 Document Revised: 10/11/2011 Document Reviewed: 02/22/2008 [facility] Patient Information 2014 [place].

## 2013-08-27 ENCOUNTER — Encounter: Payer: BC Managed Care – PPO | Admitting: Cardiovascular Disease

## 2013-08-27 ENCOUNTER — Encounter: Payer: Self-pay | Admitting: Cardiovascular Disease

## 2013-10-14 ENCOUNTER — Emergency Department (HOSPITAL_COMMUNITY)
Admission: EM | Admit: 2013-10-14 | Discharge: 2013-10-14 | Disposition: A | Discharge status: Home / Self Care | Payer: Worker's Compensation | Attending: Emergency Medicine | Admitting: Emergency Medicine

## 2013-10-14 ENCOUNTER — Emergency Department (HOSPITAL_COMMUNITY): Payer: Worker's Compensation

## 2013-10-14 ENCOUNTER — Encounter (HOSPITAL_COMMUNITY): Payer: Self-pay | Admitting: Emergency Medicine

## 2013-10-14 DIAGNOSIS — F17200 Nicotine dependence, unspecified, uncomplicated: Secondary | ICD-10-CM | POA: Insufficient documentation

## 2013-10-14 DIAGNOSIS — Z7982 Long term (current) use of aspirin: Secondary | ICD-10-CM | POA: Insufficient documentation

## 2013-10-14 DIAGNOSIS — I10 Essential (primary) hypertension: Secondary | ICD-10-CM | POA: Insufficient documentation

## 2013-10-14 DIAGNOSIS — X500XXA Overexertion from strenuous movement or load, initial encounter: Secondary | ICD-10-CM | POA: Insufficient documentation

## 2013-10-14 DIAGNOSIS — Y9389 Activity, other specified: Secondary | ICD-10-CM | POA: Insufficient documentation

## 2013-10-14 DIAGNOSIS — Y929 Unspecified place or not applicable: Secondary | ICD-10-CM | POA: Insufficient documentation

## 2013-10-14 DIAGNOSIS — Y990 Civilian activity done for income or pay: Secondary | ICD-10-CM | POA: Insufficient documentation

## 2013-10-14 DIAGNOSIS — E119 Type 2 diabetes mellitus without complications: Secondary | ICD-10-CM | POA: Insufficient documentation

## 2013-10-14 DIAGNOSIS — IMO0002 Reserved for concepts with insufficient information to code with codable children: Secondary | ICD-10-CM | POA: Insufficient documentation

## 2013-10-14 DIAGNOSIS — Z79899 Other long term (current) drug therapy: Secondary | ICD-10-CM | POA: Insufficient documentation

## 2013-10-14 DIAGNOSIS — S39011A Strain of muscle, fascia and tendon of abdomen, initial encounter: Secondary | ICD-10-CM

## 2013-10-14 DIAGNOSIS — R109 Unspecified abdominal pain: Secondary | ICD-10-CM | POA: Insufficient documentation

## 2013-10-14 LAB — COMPREHENSIVE METABOLIC PANEL
ALK PHOS: 54 U/L (ref 39–117)
ALT: 30 U/L (ref 0–53)
AST: 62 U/L — ABNORMAL HIGH (ref 0–37)
Albumin: 4.1 g/dL (ref 3.5–5.2)
BUN: 10 mg/dL (ref 6–23)
CHLORIDE: 96 meq/L (ref 96–112)
CO2: 23 mEq/L (ref 19–32)
Calcium: 9.7 mg/dL (ref 8.4–10.5)
Creatinine, Ser: 0.91 mg/dL (ref 0.50–1.35)
GFR calc non Af Amer: >90 mL/min (ref >90)
GLUCOSE: 97 mg/dL (ref 70–99)
POTASSIUM: 4.7 meq/L (ref 3.7–5.3)
SODIUM: 135 meq/L — AB (ref 137–147)
Total Bilirubin: 0.4 mg/dL (ref 0.3–1.2)
Total Protein: 8.6 g/dL — ABNORMAL HIGH (ref 6.0–8.3)

## 2013-10-14 LAB — CBC WITH DIFFERENTIAL/PLATELET
Basophils Absolute: 0 10*3/uL (ref 0.0–0.1)
Basophils Relative: 0 % (ref 0–1)
Eosinophils Absolute: 0.1 10*3/uL (ref 0.0–0.7)
Eosinophils Relative: 1 % (ref 0–5)
HCT: 43.4 % (ref 39.0–52.0)
HEMOGLOBIN: 15.3 g/dL (ref 13.0–17.0)
LYMPHS ABS: 2.1 10*3/uL (ref 0.7–4.0)
Lymphocytes Relative: 25 % (ref 12–46)
MCH: 30 pg (ref 26.0–34.0)
MCHC: 35.3 g/dL (ref 30.0–36.0)
MCV: 85.1 fL (ref 78.0–100.0)
Monocytes Absolute: 0.5 10*3/uL (ref 0.1–1.0)
Monocytes Relative: 6 % (ref 3–12)
NEUTROS ABS: 5.5 10*3/uL (ref 1.7–7.7)
NEUTROS PCT: 68 % (ref 43–77)
Platelets: 216 10*3/uL (ref 150–400)
RBC: 5.1 MIL/uL (ref 4.22–5.81)
RDW: 15.2 % (ref 11.5–15.5)
WBC: 8.1 10*3/uL (ref 4.0–10.5)

## 2013-10-14 LAB — URINALYSIS, ROUTINE W REFLEX MICROSCOPIC
BILIRUBIN URINE: NEGATIVE
Glucose, UA: NEGATIVE mg/dL
Hgb urine dipstick: NEGATIVE
Ketones, ur: NEGATIVE mg/dL
LEUKOCYTES UA: NEGATIVE
NITRITE: NEGATIVE
Protein, ur: NEGATIVE mg/dL
Specific Gravity, Urine: >1.03 — ABNORMAL HIGH (ref 1.005–1.030)
Urobilinogen, UA: 0.2 mg/dL (ref 0.0–1.0)
pH: 5.5 (ref 5.0–8.0)

## 2013-10-14 LAB — TROPONIN I

## 2013-10-14 MED ORDER — OXYCODONE-ACETAMINOPHEN 5-325 MG PO TABS
2.0000 | ORAL_TABLET | Freq: Once | ORAL | Status: AC
  Administered 2013-10-14: 2 via ORAL
  Filled 2013-10-14: qty 2

## 2013-10-14 MED ORDER — HYDROCODONE-ACETAMINOPHEN 5-325 MG PO TABS: 2.0000 | ORAL_TABLET | ORAL | Status: DC | PRN

## 2013-10-14 MED ORDER — IBUPROFEN 800 MG PO TABS
800.0000 mg | ORAL_TABLET | Freq: Once | ORAL | Status: AC
  Administered 2013-10-14: 800 mg via ORAL
  Filled 2013-10-14: qty 1

## 2013-10-14 MED ORDER — IBUPROFEN 800 MG PO TABS: 800.0000 mg | ORAL_TABLET | Freq: Three times a day (TID) | ORAL | Status: DC

## 2013-10-14 NOTE — Discharge Instructions (Signed)

## 2013-10-14 NOTE — ED Notes (Signed)
Pt escorted to lab for drug screen

## 2013-10-14 NOTE — ED Provider Notes (Signed)
CSN: [id number]     Arrival date & time 10/14/13  1207 History   This chart was scribed for Glynn Octave, MD by Ladona Ridgel Day, ED scribe. This patient was seen in room APA01/APA01 and the patient's care was started at 1207.  Chief Complaint  Patient presents with  . Abdominal Pain   The history is provided by the patient. No language interpreter was used.   HPI Comments: Michael Rivas is a 58 y.o. male who presents to the Emergency Department complaining of right flank pain which began while pushing things with a broom at work this AM, felt a pull in his muscle and having constant pain since this time, pain worse with movement and improves w/laying down on his left side. He denies any previous similar episodes. He denies emesis, SOB, CP. Has been eating/drinking normally. Denies any events leading up to this problem. He denies any dysuria or scrotal swelling. He denies emesis, SOB or CP. He has been eating/drinking normally. No hx of heart or lung problems.  Hx of choleycystectomy   Past Medical History  Diagnosis Date  . Hypertension   . Diabetes mellitus without complication    Past Surgical History  Procedure Laterality Date  . Cholecystectomy     No family history on file. History  Substance Use Topics  . Smoking status: Current Every Day Smoker -- 1.00 packs/day    Types: Cigarettes  . Smokeless tobacco: Not on file  . Alcohol Use: Yes     Comment: social    Review of Systems  Constitutional: Negative for fever and chills.  Respiratory: Negative for cough and shortness of breath.   Cardiovascular: Negative for chest pain.  Gastrointestinal: Negative for abdominal pain.  Genitourinary: Positive for flank pain.  Musculoskeletal: Negative for back pain.  All other systems reviewed and are negative.    Allergies  Review of patient's allergies indicates no known allergies.  Home Medications   Current Outpatient Rx  Name  Route  Sig  Dispense  Refill  . aspirin EC 81  MG tablet   Oral   Take 81 mg by mouth daily.         . metoprolol tartrate (LOPRESSOR) 25 MG tablet   Oral   Take 25 mg by mouth daily.          Marland Kitchen HYDROcodone-acetaminophen (NORCO/VICODIN) 5-325 MG per tablet   Oral   Take 2 tablets by mouth every 4 (four) hours as needed.   10 tablet   0   . ibuprofen (ADVIL,MOTRIN) 800 MG tablet   Oral   Take 1 tablet (800 mg total) by mouth 3 (three) times daily.   21 tablet   0    Triage Vitals: BP 148/97  Pulse 97  Temp(Src) 98.2 F (36.8 C) (Oral)  Resp 16  Ht 6\' 3"  (1.905 m)  Wt 165 lb (74.844 kg)  BMI 20.62 kg/m2  SpO2 96%  Physical Exam  Nursing note and vitals reviewed. Constitutional: He is oriented to person, place, and time. He appears well-developed and well-nourished. No distress.  HENT:  Head: Normocephalic and atraumatic.  Eyes: Conjunctivae are normal. Right eye exhibits no discharge. Left eye exhibits no discharge.  Neck: Normal range of motion.  Cardiovascular: Normal rate, regular rhythm and normal heart sounds.   Pulmonary/Chest: Effort normal and breath sounds normal. No respiratory distress. He has no wheezes. He has no rales. He exhibits no tenderness.  No rib tenderness  Abdominal: Soft. Bowel sounds are normal. He exhibits  no distension. There is tenderness.  Tender over right flank and upper abdomen   Musculoskeletal: Normal range of motion. He exhibits no edema.  Neurological: He is alert and oriented to person, place, and time.  Skin: Skin is warm and dry.  Psychiatric: He has a normal mood and affect. Thought content normal.    ED Course  Procedures (including critical care time) DIAGNOSTIC STUDIES: Oxygen Saturation is 96% on room air, normal by my interpretation.    COORDINATION OF CARE: At 1245 PM Discussed treatment plan with patient which includes pain medicine, advil, abdominal CT, blood work, cardiac enzymes and UA. Patient agrees.   Labs Review Labs Reviewed  URINALYSIS, ROUTINE W  REFLEX MICROSCOPIC - Abnormal; Notable for the following:    Color, Urine AMBER (*)    Specific Gravity, Urine >1.030 (*)    All other components within normal limits  COMPREHENSIVE METABOLIC PANEL - Abnormal; Notable for the following:    Sodium 135 (*)    Total Protein 8.6 (*)    AST 62 (*)    All other components within normal limits  CBC WITH DIFFERENTIAL  TROPONIN I   Imaging Review Ct Abdomen Pelvis Wo Contrast  10/14/2013   CLINICAL DATA:  Right-sided flank pain.  EXAM: CT ABDOMEN AND PELVIS WITHOUT CONTRAST  TECHNIQUE: Multidetector CT imaging of the abdomen and pelvis was performed following the standard protocol without intravenous contrast.  COMPARISON:  10/10/2012  FINDINGS: Mild scarring in the inferior aspect of the right middle lobe is stable. Surgical clips from prior cholecystectomy noted. No evidence of biliary ductal dilatation. Unenhanced images of the liver, pancreas, spleen, adrenal glands, and kidneys are normal in appearance. No evidence of renal calculi or hydronephrosis. No evidence of ureteral calculi or dilatation. No bladder calculi identified.  No soft tissue masses or lymphadenopathy identified. No evidence of inflammatory process or abnormal fluid collections. No evidence of bowel wall thickening or dilatation. Normal appendix is visualized. No suspicious bone lesions identified.  IMPRESSION: No evidence of urolithiasis, hydronephrosis, or other acute findings.   Electronically Signed   By: Myles RosenthalJohn  Stahl M.D.   On: 10/14/2013 14:00   Dg Chest 2 View  10/14/2013   CLINICAL DATA:  Chest pain and cough  EXAM: CHEST  2 VIEW  COMPARISON:  August 13, 2013  FINDINGS: There is underlying emphysematous change. There is no appreciable edema or consolidation. The heart size is normal. The pulmonary vascularity reflects the underlying emphysematous change. No pneumothorax. No adenopathy. No bone lesions.  IMPRESSION: Underlying emphysematous change.  No edema or consolidation.    Electronically Signed   By: Bretta BangWilliam  Woodruff M.D.   On: 10/14/2013 14:07     EKG Interpretation None      MDM   Final diagnoses:  Abdominal wall strain   Right flank and upper abdominal pain that onset while pushing some jugs with a broom. No pain before this. No difficulty breathing, chest pain, vomiting.  No rib tenderness on exam. Breath sounds equal. Soreness to palpation of upper abdomen. Previous cholecystectomy. UA negative. Chest x-ray negative, no rib fracture pneumothorax.  We'll treat for muscle strain. Anti-inflammatories and pain medications given. Follow a PCP. Return precautions discussed.  BP 148/97  Pulse 97  Temp(Src) 98.2 F (36.8 C) (Oral)  Resp 16  Ht 6\' 3"  (1.905 m)  Wt 165 lb (74.844 kg)  BMI 20.62 kg/m2  SpO2 96%  I personally performed the services described in this documentation, which was scribed in my presence. The  recorded information has been reviewed and is accurate.    Glynn Octave, MD 10/14/13 [phone]

## 2013-10-14 NOTE — ED Notes (Signed)
Pt states he was sweeping with a push broom while at work this morning and believes he may have "pulled something" pain to RUQ, just below ribs. NAD

## 2014-08-02 HISTORY — PX: BACK SURGERY: SHX140

## 2014-10-08 ENCOUNTER — Emergency Department (HOSPITAL_COMMUNITY)
Admission: EM | Admit: 2014-10-08 | Discharge: 2014-10-08 | Disposition: A | Discharge status: Home / Self Care | Payer: 59 | Attending: Emergency Medicine | Admitting: Emergency Medicine

## 2014-10-08 ENCOUNTER — Encounter (HOSPITAL_COMMUNITY): Payer: Self-pay | Admitting: Emergency Medicine

## 2014-10-08 DIAGNOSIS — X58XXXA Exposure to other specified factors, initial encounter: Secondary | ICD-10-CM | POA: Diagnosis not present

## 2014-10-08 DIAGNOSIS — S39012A Strain of muscle, fascia and tendon of lower back, initial encounter: Secondary | ICD-10-CM

## 2014-10-08 DIAGNOSIS — Z9049 Acquired absence of other specified parts of digestive tract: Secondary | ICD-10-CM | POA: Insufficient documentation

## 2014-10-08 DIAGNOSIS — Y998 Other external cause status: Secondary | ICD-10-CM | POA: Diagnosis not present

## 2014-10-08 DIAGNOSIS — I10 Essential (primary) hypertension: Secondary | ICD-10-CM | POA: Diagnosis not present

## 2014-10-08 DIAGNOSIS — Z7952 Long term (current) use of systemic steroids: Secondary | ICD-10-CM | POA: Diagnosis not present

## 2014-10-08 DIAGNOSIS — Y9289 Other specified places as the place of occurrence of the external cause: Secondary | ICD-10-CM | POA: Diagnosis not present

## 2014-10-08 DIAGNOSIS — Y9389 Activity, other specified: Secondary | ICD-10-CM | POA: Insufficient documentation

## 2014-10-08 DIAGNOSIS — Z720 Tobacco use: Secondary | ICD-10-CM | POA: Insufficient documentation

## 2014-10-08 DIAGNOSIS — Z791 Long term (current) use of non-steroidal anti-inflammatories (NSAID): Secondary | ICD-10-CM | POA: Diagnosis not present

## 2014-10-08 DIAGNOSIS — E119 Type 2 diabetes mellitus without complications: Secondary | ICD-10-CM | POA: Insufficient documentation

## 2014-10-08 DIAGNOSIS — S3992XA Unspecified injury of lower back, initial encounter: Secondary | ICD-10-CM | POA: Diagnosis present

## 2014-10-08 MED ORDER — PREDNISONE 20 MG PO TABS: 60.0000 mg | ORAL_TABLET | Freq: Every day | ORAL | Status: DC

## 2014-10-08 MED ORDER — OXYCODONE-ACETAMINOPHEN 5-325 MG PO TABS: 1.0000 | ORAL_TABLET | Freq: Four times a day (QID) | ORAL | Status: DC | PRN

## 2014-10-08 MED ORDER — CYCLOBENZAPRINE HCL 10 MG PO TABS: 10.0000 mg | ORAL_TABLET | Freq: Two times a day (BID) | ORAL | Status: DC | PRN

## 2014-10-08 MED ORDER — OXYCODONE-ACETAMINOPHEN 5-325 MG PO TABS
2.0000 | ORAL_TABLET | Freq: Once | ORAL | Status: AC
  Administered 2014-10-08: 2 via ORAL
  Filled 2014-10-08: qty 2

## 2014-10-08 NOTE — Discharge Instructions (Signed)

## 2014-10-08 NOTE — ED Provider Notes (Signed)
CSN: [id number]     Arrival date & time 10/08/14  0730 History  This chart was scribed for Benjiman Core, MD by Modena Jansky, ED Scribe. This patient was seen in room APA11/APA11 and the patient's care was started at 7:45 AM.   Chief Complaint  Patient presents with  . Back Pain   The history is provided by the patient. No language interpreter was used.    HPI Comments: Michael Rivas is a 59 y.o. male with a hx of chronic back pain who presents to the Emergency Department complaining of constant moderate back pain that started 4 days ago. He reports that he has a hx of frequent back pain flare ups, but he had an onset of pain after he was injured lifting boxes 4 days ago. He states that the pain radiates to his LE. He reports that heavy lifting exacerbates the pain. He denies any any weakness, bladder or bowel incontinence, or fever.   Past Medical History  Diagnosis Date  . Hypertension   . Diabetes mellitus without complication    Past Surgical History  Procedure Laterality Date  . Cholecystectomy     Family History  Problem Relation Age of Onset  . Stroke Mother   . Heart failure Father    History  Substance Use Topics  . Smoking status: Current Every Day Smoker -- 1.00 packs/day    Types: Cigarettes  . Smokeless tobacco: Never Used  . Alcohol Use: Yes     Comment: social    Review of Systems  Constitutional: Negative for fever.  Musculoskeletal: Positive for back pain.  Neurological: Negative for weakness.    Allergies  Review of patient's allergies indicates no known allergies.  Home Medications   Prior to Admission medications   Medication Sig Start Date End Date Taking? Authorizing Provider  aspirin EC 81 MG tablet Take 81 mg by mouth daily.    Historical Provider, MD  cyclobenzaprine (FLEXERIL) 10 MG tablet Take 1 tablet (10 mg total) by mouth 2 (two) times daily as needed for muscle spasms. 10/08/14   Benjiman Core, MD  HYDROcodone-acetaminophen  (NORCO/VICODIN) 5-325 MG per tablet Take 2 tablets by mouth every 4 (four) hours as needed. 10/14/13   Glynn Octave, MD  ibuprofen (ADVIL,MOTRIN) 800 MG tablet Take 1 tablet (800 mg total) by mouth 3 (three) times daily. 10/14/13   Glynn Octave, MD  metoprolol tartrate (LOPRESSOR) 25 MG tablet Take 25 mg by mouth daily.     Historical Provider, MD  oxyCODONE-acetaminophen (PERCOCET/ROXICET) 5-325 MG per tablet Take 1-2 tablets by mouth every 6 (six) hours as needed for severe pain. 10/08/14   Benjiman Core, MD  predniSONE (DELTASONE) 20 MG tablet Take 3 tablets (60 mg total) by mouth daily. 10/08/14   Benjiman Core, MD   BP 162/94 mmHg  Pulse 81  Temp(Src) 98.9 F (37.2 C) (Oral)  Resp 18  Ht  (1.88 m)  Wt 155 lb (70.308 kg)  BMI 19.89 kg/m2  SpO2 100% Physical Exam  Constitutional: He appears well-developed and well-nourished. No distress.  Cardiovascular: Normal rate and regular rhythm.   Pulmonary/Chest: Effort normal. No respiratory distress.  Abdominal: Soft. There is no tenderness.  Musculoskeletal:  Pain with straight leg raise, left worse than right.  Pain noted in feet.  DP pulse present.   Neurological: He is alert.  Good strength noted in BLE.   Skin: Skin is warm and dry.  Psychiatric: He has a normal mood and affect. His behavior is normal.  Nursing note and vitals reviewed.   ED Course  Procedures (including critical care time) COORDINATION OF CARE: 7:49 AM- Pt advised of plan for treatment which includes medication and pt agrees.  Results for orders placed or performed during the hospital encounter of 10/14/13  Urinalysis, Routine w reflex microscopic  Result Value Ref Range   Color, Urine AMBER (A) YELLOW   APPearance CLEAR CLEAR   Specific Gravity, Urine >1.030 (H) 1.005 - 1.030   pH 5.5 5.0 - 8.0   Glucose, UA NEGATIVE NEGATIVE mg/dL   Hgb urine dipstick NEGATIVE NEGATIVE   Bilirubin Urine NEGATIVE NEGATIVE   Ketones, ur NEGATIVE NEGATIVE  mg/dL   Protein, ur NEGATIVE NEGATIVE mg/dL   Urobilinogen, UA 0.2 0.0 - 1.0 mg/dL   Nitrite NEGATIVE NEGATIVE   Leukocytes, UA NEGATIVE NEGATIVE  CBC with Differential  Result Value Ref Range   WBC 8.1 4.0 - 10.5 K/uL   RBC 5.10 4.22 - 5.81 MIL/uL   Hemoglobin 15.3 13.0 - 17.0 g/dL   HCT 16.3 84.5 - 36.4 %   MCV 85.1 78.0 - 100.0 fL   MCH 30.0 26.0 - 34.0 pg   MCHC 35.3 30.0 - 36.0 g/dL   RDW 68.0 32.1 - 22.4 %   Platelets 216 150 - 400 K/uL   Neutrophils Relative % 68 43 - 77 %   Neutro Abs 5.5 1.7 - 7.7 K/uL   Lymphocytes Relative 25 12 - 46 %   Lymphs Abs 2.1 0.7 - 4.0 K/uL   Monocytes Relative 6 3 - 12 %   Monocytes Absolute 0.5 0.1 - 1.0 K/uL   Eosinophils Relative 1 0 - 5 %   Eosinophils Absolute 0.1 0.0 - 0.7 K/uL   Basophils Relative 0 0 - 1 %   Basophils Absolute 0.0 0.0 - 0.1 K/uL  Comprehensive metabolic panel  Result Value Ref Range   Sodium 135 (L) 137 - 147 mEq/L   Potassium 4.7 3.7 - 5.3 mEq/L   Chloride 96 96 - 112 mEq/L   CO2 23 19 - 32 mEq/L   Glucose, Bld 97 70 - 99 mg/dL   BUN 10 6 - 23 mg/dL   Creatinine, Ser 8.25 0.50 - 1.35 mg/dL   Calcium 9.7 8.4 - 00.3 mg/dL   Total Protein 8.6 (H) 6.0 - 8.3 g/dL   Albumin 4.1 3.5 - 5.2 g/dL   AST 62 (H) 0 - 37 U/L   ALT 30 0 - 53 U/L   Alkaline Phosphatase 54 39 - 117 U/L   Total Bilirubin 0.4 0.3 - 1.2 mg/dL   GFR calc non Af Amer >90 >90 mL/min   GFR calc Af Amer >90 >90 mL/min  Troponin I  Result Value Ref Range   Troponin I <0.30 <0.30 ng/mL   No results found.   Labs Review Labs Reviewed - No data to display  Imaging Review No results found.   EKG Interpretation None      MDM   Final diagnoses:  Lumbar strain, initial encounter    Patient with back pain. History of same. Will d/c no red flags.  I personally performed the services described in this documentation, which was scribed in my presence. The recorded information has been reviewed and is accurate.      Benjiman Core,  MD 10/09/14 0700

## 2014-10-08 NOTE — ED Notes (Signed)
Pt reports onset of back pain after lifting boxes on Fri. Pt c/o pain with radiation down hid L leg. Pt has hx of back pain.

## 2014-11-05 ENCOUNTER — Emergency Department (HOSPITAL_COMMUNITY)
Admission: EM | Admit: 2014-11-05 | Discharge: 2014-11-05 | Disposition: A | Discharge status: Home / Self Care | Payer: 59 | Attending: Emergency Medicine | Admitting: Emergency Medicine

## 2014-11-05 ENCOUNTER — Emergency Department (HOSPITAL_COMMUNITY): Payer: 59

## 2014-11-05 ENCOUNTER — Encounter (HOSPITAL_COMMUNITY): Payer: Self-pay | Admitting: Emergency Medicine

## 2014-11-05 DIAGNOSIS — M5136 Other intervertebral disc degeneration, lumbar region: Secondary | ICD-10-CM | POA: Diagnosis not present

## 2014-11-05 DIAGNOSIS — M5416 Radiculopathy, lumbar region: Secondary | ICD-10-CM | POA: Insufficient documentation

## 2014-11-05 DIAGNOSIS — I10 Essential (primary) hypertension: Secondary | ICD-10-CM | POA: Insufficient documentation

## 2014-11-05 DIAGNOSIS — R531 Weakness: Secondary | ICD-10-CM | POA: Diagnosis not present

## 2014-11-05 DIAGNOSIS — Z7982 Long term (current) use of aspirin: Secondary | ICD-10-CM | POA: Insufficient documentation

## 2014-11-05 DIAGNOSIS — Z79899 Other long term (current) drug therapy: Secondary | ICD-10-CM | POA: Insufficient documentation

## 2014-11-05 DIAGNOSIS — M549 Dorsalgia, unspecified: Secondary | ICD-10-CM | POA: Diagnosis present

## 2014-11-05 DIAGNOSIS — E119 Type 2 diabetes mellitus without complications: Secondary | ICD-10-CM | POA: Diagnosis not present

## 2014-11-05 DIAGNOSIS — G8929 Other chronic pain: Secondary | ICD-10-CM | POA: Insufficient documentation

## 2014-11-05 DIAGNOSIS — Z720 Tobacco use: Secondary | ICD-10-CM | POA: Diagnosis not present

## 2014-11-05 LAB — URINE MICROSCOPIC-ADD ON

## 2014-11-05 LAB — URINALYSIS, ROUTINE W REFLEX MICROSCOPIC
Bilirubin Urine: NEGATIVE
GLUCOSE, UA: NEGATIVE mg/dL
Ketones, ur: NEGATIVE mg/dL
Nitrite: NEGATIVE
PH: 5.5 (ref 5.0–8.0)
SPECIFIC GRAVITY, URINE: 1.01 (ref 1.005–1.030)
Urobilinogen, UA: 0.2 mg/dL (ref 0.0–1.0)

## 2014-11-05 MED ORDER — HYDROCODONE-ACETAMINOPHEN 5-325 MG PO TABS: 1.0000 | ORAL_TABLET | ORAL | Status: DC | PRN

## 2014-11-05 MED ORDER — PREDNISONE 10 MG PO TABS: ORAL_TABLET | ORAL | Status: DC

## 2014-11-05 MED ORDER — HYDROMORPHONE HCL 1 MG/ML IJ SOLN
1.0000 mg | Freq: Once | INTRAMUSCULAR | Status: AC
  Administered 2014-11-05: 1 mg via INTRAMUSCULAR
  Filled 2014-11-05: qty 1

## 2014-11-05 MED ORDER — ONDANSETRON 8 MG PO TBDP
8.0000 mg | ORAL_TABLET | Freq: Once | ORAL | Status: AC
  Administered 2014-11-05: 8 mg via ORAL
  Filled 2014-11-05: qty 1

## 2014-11-05 MED ORDER — DEXAMETHASONE SODIUM PHOSPHATE 4 MG/ML IJ SOLN
8.0000 mg | Freq: Once | INTRAMUSCULAR | Status: AC
  Administered 2014-11-05: 8 mg via INTRAVENOUS
  Filled 2014-11-05: qty 2

## 2014-11-05 NOTE — ED Provider Notes (Signed)
The patient is a 59 year old male, he has had pain in his lumbar back especially his left lumbar back radiating into his left leg which is been going on for several months since he stated that he lifted up a heavy item at work. Since that time he has had chronic pain, he states that it waxes and wanes in intensity but is always there, he reports some difficulty walking because his leg is weak and it hurts. On exam the patient has reproducible tenderness in the left paraspinal and left buttock area and no spinal tenderness, normal strength at the hip flexors, knee extensors, knee flexors, ankle extensors and flexors bilaterally. He has normal sensation to light touch and pinprick diffusely, normal reflexes at the patellar tendons. MRI reviewed with the patient, no findings to suggest an acute surgical cause of the patient's pain, he will need referral to a spinal specialist, steroid injection and pain control, stable for discharge.  Medical screening examination/treatment/procedure(s) were conducted as a shared visit with non-physician practitioner(s) and myself.  I personally evaluated the patient during the encounter.  Clinical Impression:   Final diagnoses:  Lumbar radiculopathy, acute  DDD (degenerative disc disease), lumbar         Eber Hong, MD 11/06/14 [phone]

## 2014-11-05 NOTE — ED Notes (Signed)
Patient complaining of back pain radiating into left hip and left leg x 1 week. States he has history of same.

## 2014-11-05 NOTE — Discharge Instructions (Signed)
Lumbosacral Radiculopathy Lumbosacral radiculopathy is a pinched nerve or nerves in the low back (lumbosacral area). When this happens you may have weakness in your legs and may not be able to stand on your toes. You may have pain going down into your legs. There may be difficulties with walking normally. There are many causes of this problem. Sometimes this may happen from an injury, or simply from arthritis or boney problems. It may also be caused by other illnesses such as diabetes. If there is no improvement after treatment, further studies may be done to find the exact cause. DIAGNOSIS  X-rays may be needed if the problems become long standing. Electromyograms may be done. This study is one in which the working of nerves and muscles is studied. HOME CARE INSTRUCTIONS   Applications of ice packs may be helpful. Ice can be used in a plastic bag with a towel around it to prevent frostbite to skin. This may be used every 2 hours for 20 to 30 minutes, or as needed, while awake, or as directed by your caregiver.  Only take over-the-counter or prescription medicines for pain, discomfort, or fever as directed by your caregiver.  If physical therapy was prescribed, follow your caregiver's directions. SEEK IMMEDIATE MEDICAL CARE IF:   You have pain not controlled with medications.  You seem to be getting worse rather than better.  You develop increasing weakness in your legs.  You develop loss of bowel or bladder control.  You have difficulty with walking or balance, or develop clumsiness in the use of your legs.  You have a fever. MAKE SURE YOU:   Understand these instructions.  Will watch your condition.  Will get help right away if you are not doing well or get worse. Document Released: 07/19/2005 Document Revised: 10/11/2011 Document Reviewed: 03/08/2008 [facility] Patient Information 2015 [place], [place]. This information is not intended to replace advice given to you by your health  care provider. Make sure you discuss any questions you have with your health care provider.  Degenerative Disk Disease Degenerative disk disease is a condition caused by the changes that occur in the cushions of the backbone (spinal disks) as you grow older. Spinal disks are soft and compressible disks located between the bones of the spine (vertebrae). They act like shock absorbers. Degenerative disk disease can affect the whole spine. However, the neck and lower back are most commonly affected. Many changes can occur in the spinal disks with aging, such as:  The spinal disks may dry and shrink.  Small tears may occur in the tough, outer covering of the disk (annulus).  The disk space may become smaller due to loss of water.  Abnormal growths in the bone (spurs) may occur. This can put pressure on the nerve roots exiting the spinal canal, causing pain.  The spinal canal may become narrowed. CAUSES  Degenerative disk disease is a condition caused by the changes that occur in the spinal disks with aging. The exact cause is not known, but there is a genetic basis for many patients. Degenerative changes can occur due to loss of fluid in the disk. This makes the disk thinner and reduces the space between the backbones. Small cracks can develop in the outer layer of the disk. This can lead to the breakdown of the disk. You are more likely to get degenerative disk disease if you are overweight. Smoking cigarettes and doing heavy work such as weightlifting can also increase your risk of this condition. Degenerative changes  can start after a sudden injury. Growth of bone spurs can compress the nerve roots and cause pain.  SYMPTOMS  The symptoms vary from person to person. Some people may have no pain, while others have severe pain. The pain may be so severe that it can limit your activities. The location of the pain depends on the part of your backbone that is affected. You will have neck or arm pain if a  disk in the neck area is affected. You will have pain in your back, buttocks, or legs if a disk in the lower back is affected. The pain becomes worse while bending, reaching up, or with twisting movements. The pain may start gradually and then get worse as time passes. It may also start after a major or minor injury. You may feel numbness or tingling in the arms or legs.  DIAGNOSIS  Your caregiver will ask you about your symptoms and about activities or habits that may cause the pain. He or she may also ask about any injuries, diseases, or treatments you have had earlier. Your caregiver will examine you to check for the range of movement that is possible in the affected area, to check for strength in your extremities, and to check for sensation in the areas of the arms and legs supplied by different nerve roots. An X-ray of the spine may be taken. Your caregiver may suggest other imaging tests, such as magnetic resonance imaging (MRI), if needed.  TREATMENT  Treatment includes rest, modifying your activities, and applying ice and heat. Your caregiver may prescribe medicines to reduce your pain and may ask you to do some exercises to strengthen your back. In some cases, you may need surgery. You and your caregiver will decide on the treatment that is best for you. HOME CARE INSTRUCTIONS   Follow proper lifting and walking techniques as advised by your caregiver.  Maintain good posture.  Exercise regularly as advised.  Perform relaxation exercises.  Change your sitting, standing, and sleeping habits as advised. Change positions frequently.  Lose weight as advised.  Stop smoking if you smoke.  Wear supportive footwear. SEEK MEDICAL CARE IF:  Your pain does not go away within 1 to 4 weeks. SEEK IMMEDIATE MEDICAL CARE IF:   Your pain is severe.  You notice weakness in your arms, hands, or legs.  You begin to lose control of your bladder or bowel movements. MAKE SURE YOU:   Understand  these instructions.  Will watch your condition.  Will get help right away if you are not doing well or get worse. Document Released: 05/16/2007 Document Revised: 10/11/2011 Document Reviewed: 11/20/2013 [facility] Patient Information 2015 [place], [place]. This information is not intended to replace advice given to you by your health care provider. Make sure you discuss any questions you have with your health care provider.   Take your next dose of prednisone tomorrow morning.  Use the the other medicine as directed.  Do not drive within 4 hours of taking hydrocodone as this will make you drowsy.  Avoid lifting,  Bending,  Twisting or any other activity that worsens your pain over the next week.  Apply an  icepack  to your lower back for 10-15 minutes every 2 hours for the next 2 days.  You should get rechecked if your symptoms are not better over the next 5 days,  Or you develop increased pain,  Weakness in your leg(s) or loss of bladder or bowel function - these are symptoms of a  worse injury.

## 2014-11-05 NOTE — ED Provider Notes (Signed)
CSN: [id number]     Arrival date & time 11/05/14  0900 History   First MD Initiated Contact with Patient 11/05/14 [phone]     Chief Complaint  Patient presents with  . Back Pain     (Consider location/radiation/quality/duration/timing/severity/associated sxs/prior Treatment) The history is provided by the patient.   Michael Rivas is a 59 y.o. male with a history of acute on chronic low back pain which he states never improved since his last visit here for this on 10/08/14, a flare believes due to lifting heavy boxes prior to that visit.  He endorses increased pain and numbness radiating into his left left to his foot along with weakness but denies fall or buckling of this leg.  He has noted episodes of urinary incontinence since his last visit here and has had loose, hard to control stools over the past 2 days.  He denies fevers, chills, dysuria, hematuria and denies flank pain.  His back pain is left lumbar and he describes "it feels like my hip is out of socket" pointing to his left lateral posterior pelvis.  He was seen by his pcp 2 weeks ago who suggested referral to a back specialist.  The patient has not yet followed this up. He has taken hydrocodone, Percocet, Flexeril and prednisone in the past month without significant improvement.  He does not have a history of cancer.  No IVDU.     Past Medical History  Diagnosis Date  . Hypertension   . Diabetes mellitus without complication    Past Surgical History  Procedure Laterality Date  . Cholecystectomy     Family History  Problem Relation Age of Onset  . Stroke Mother   . Heart failure Father    History  Substance Use Topics  . Smoking status: Current Every Day Smoker -- 1.00 packs/day    Types: Cigarettes  . Smokeless tobacco: Never Used  . Alcohol Use: Yes     Comment: social    Review of Systems  Constitutional: Negative for fever.  Respiratory: Negative for shortness of breath.   Cardiovascular: Negative for chest pain  and leg swelling.  Gastrointestinal: Negative for abdominal pain, constipation and abdominal distention.  Genitourinary: Negative for dysuria, urgency, frequency, flank pain and difficulty urinating.  Musculoskeletal: Positive for back pain. Negative for joint swelling and gait problem.  Skin: Negative for rash.  Neurological: Positive for weakness. Negative for numbness.      Allergies  Review of patient's allergies indicates no known allergies.  Home Medications   Prior to Admission medications   Medication Sig Start Date End Date Taking? Authorizing Provider  aspirin EC 81 MG tablet Take 81 mg by mouth daily.   Yes Historical Provider, MD  metoprolol tartrate (LOPRESSOR) 25 MG tablet Take 25 mg by mouth daily.    Yes Historical Provider, MD  cyclobenzaprine (FLEXERIL) 10 MG tablet Take 1 tablet (10 mg total) by mouth 2 (two) times daily as needed for muscle spasms. Patient not taking: Reported on 11/05/2014 10/08/14   Benjiman Core, MD  HYDROcodone-acetaminophen (NORCO/VICODIN) 5-325 MG per tablet Take 1 tablet by mouth every 4 (four) hours as needed. 11/05/14   Burgess Amor, PA-C  ibuprofen (ADVIL,MOTRIN) 800 MG tablet Take 1 tablet (800 mg total) by mouth 3 (three) times daily. Patient not taking: Reported on 11/05/2014 10/14/13   Glynn Octave, MD  oxyCODONE-acetaminophen (PERCOCET/ROXICET) 5-325 MG per tablet Take 1-2 tablets by mouth every 6 (six) hours as needed for severe pain. Patient not taking: Reported  on 11/05/2014 10/08/14   Benjiman Core, MD  predniSONE (DELTASONE) 10 MG tablet 6, 5, 4, 3, 2 then 1 tablet by mouth daily for 6 days total. 11/05/14   Burgess Amor, PA-C   BP 150/91 mmHg  Pulse 74  Temp(Src) 98 F (36.7 C) (Oral)  Resp 18  Ht  (1.905 m)  Wt 165 lb (74.844 kg)  BMI 20.62 kg/m2  SpO2 100% Physical Exam  Constitutional: He appears well-developed and well-nourished.  HENT:  Head: Normocephalic.  Eyes: Conjunctivae are normal.  Neck: Normal range of  motion. Neck supple.  Cardiovascular: Normal rate and intact distal pulses.   Pedal pulses normal.  Pulmonary/Chest: Effort normal.  Abdominal: Soft. Bowel sounds are normal. He exhibits no distension and no mass.  Musculoskeletal: Normal range of motion. He exhibits no edema.       Lumbar back: He exhibits tenderness. He exhibits no swelling, no edema and no spasm.  TTP left paralumbar spine into left buttock.  No midline pain or deformity.  Neurological: He is alert. He displays no atrophy and no tremor. No sensory deficit. He exhibits normal muscle tone. Gait normal.  Reflex Scores:      Patellar reflexes are 2+ on the right side and 2+ on the left side.      Achilles reflexes are 2+ on the right side and 2+ on the left side. 4/5 strength in left hip and knee flexor and extensor muscle groups vs 5/5 on right.  Ankle flexion and extension intact. No clonus.  Skin: Skin is warm and dry.  Psychiatric: He has a normal mood and affect.  Nursing note and vitals reviewed.   ED Course  Procedures (including critical care time) Labs Review Labs Reviewed  URINALYSIS, ROUTINE W REFLEX MICROSCOPIC - Abnormal; Notable for the following:    Hgb urine dipstick TRACE (*)    Protein, ur TRACE (*)    Leukocytes, UA TRACE (*)    All other components within normal limits  URINE MICROSCOPIC-ADD ON    Imaging Review Mr Lumbar Spine Wo Contrast  11/05/2014   CLINICAL DATA:  Chronic increasing low back pain, left lower extremity weakness and numbness  EXAM: MRI LUMBAR SPINE WITHOUT CONTRAST  TECHNIQUE: Multiplanar, multisequence MR imaging of the lumbar spine was performed. No intravenous contrast was administered.  COMPARISON:  None.  FINDINGS: The vertebral bodies of the lumbar spine are normal in size. The vertebral bodies of the lumbar spine are normal in alignment. There is normal bone marrow signal demonstrated throughout the vertebra. There is degenerative disc disease with disc height loss at L2-3,  L3-4 and L4-5 most severe at L3-4. There are Modic endplate changes at L3-4.  The spinal cord is normal in signal and contour. The cord terminates normally at L1 . The nerve roots of the cauda equina and the filum terminale are normal.  The visualized portions of the SI joints are unremarkable.  The imaged intra-abdominal contents are unremarkable.  T12-L1: No significant disc bulge. No evidence of neural foraminal stenosis. No central canal stenosis.  L1-L2: No significant disc bulge. No evidence of neural foraminal stenosis. No central canal stenosis. Mild bilateral facet arthropathy.  L2-L3: Mild broad-based disc bulge with a large right paracentral disc protrusion with mass effect on the thecal sac which is displaced towards the left. There is severe narrowing of the right lateral recess with impingement of the intraspinal right L3 nerve root. There is left lateral recess stenosis. There is mild bilateral facet arthropathy.  There is overall severe spinal stenosis resulting from the disc protrusion. No evidence of neural foraminal stenosis.  L3-L4: Mild broad-based disc bulge eccentric towards the left with a broad left paracentral/ foraminal disc protrusion narrowing the left lateral recess and left neural foramen. No right foraminal stenosis. Mild bilateral facet arthropathy. No central canal stenosis.  L4-L5: Mild broad-based disc bulge. Mild bilateral facet arthropathy. Mild bilateral foraminal narrowing. No central canal stenosis.  L5-S1: No significant disc bulge. No evidence of neural foraminal stenosis. No central canal stenosis. Moderate right facet arthropathy.  IMPRESSION: 1. At L2-3 there is a mild broad-based disc bulge with a large right paracentral disc protrusion with mass effect on the thecal sac which is displaced towards the left. There is severe narrowing of the right lateral recess with impingement of the intraspinal right L3 nerve root. There is left lateral recess stenosis. There is mild  bilateral facet arthropathy. There is overall severe spinal stenosis resulting from the disc protrusion. 2. At L3-4 there is a mild broad-based disc bulge eccentric towards the left with a broad left paracentral/ foraminal disc protrusion narrowing the left lateral recess and left neural foramen. No right foraminal stenosis. Mild bilateral facet arthropathy.   Electronically Signed   By: Elige Ko   On: 11/05/2014 11:33     EKG Interpretation None      MDM   Final diagnoses:  Lumbar radiculopathy, acute  DDD (degenerative disc disease), lumbar    Patients labs and/or radiological studies were reviewed and considered during the medical decision making and disposition process.  Results were also discussed with patient.  Pt was given dilaudid 1 mg IM with improvement in pain.  MRI reviewed - pt has definite lumbar disease and will require specialty f/u.  Sx do not correlate with MRI findings.  He was given a decadron injection here, prescribed prednisone taper and hydrocodone. Advised heat tx, referral given to [facility] for definitive tx.  No neuro deficit on exam or by history to suggest emergent or surgical presentation.  Also discussed worsened sx that should prompt immediate re-evaluation including distal weakness, bowel/bladder retention/incontinence.  Pt was seen by Dr. Hyacinth Meeker during this ed visit.        Burgess Amor, PA-C 11/05/14 1748  Eber Hong, MD 11/06/14 [phone]

## 2014-11-05 NOTE — ED Notes (Signed)
Pt not available for pain reassessment at this time.

## 2014-11-22 ENCOUNTER — Other Ambulatory Visit: Payer: Self-pay | Admitting: Neurosurgery

## 2014-11-22 DIAGNOSIS — M5126 Other intervertebral disc displacement, lumbar region: Secondary | ICD-10-CM

## 2014-11-27 ENCOUNTER — Ambulatory Visit
Admission: RE | Admit: 2014-11-27 | Discharge: 2014-11-27 | Disposition: A | Discharge status: Home / Self Care | Payer: 59 | Source: Ambulatory Visit | Attending: Neurosurgery | Admitting: Neurosurgery

## 2014-11-27 DIAGNOSIS — M5126 Other intervertebral disc displacement, lumbar region: Secondary | ICD-10-CM

## 2014-11-27 MED ORDER — IOHEXOL 180 MG/ML  SOLN
1.0000 mL | Freq: Once | INTRAMUSCULAR | Status: AC | PRN
  Administered 2014-11-27: 1 mL via EPIDURAL

## 2014-11-27 MED ORDER — METHYLPREDNISOLONE ACETATE 40 MG/ML INJ SUSP (RADIOLOG
120.0000 mg | Freq: Once | INTRAMUSCULAR | Status: AC
  Administered 2014-11-27: 120 mg via EPIDURAL

## 2014-11-27 NOTE — Discharge Instructions (Signed)

## 2015-01-08 ENCOUNTER — Emergency Department (HOSPITAL_COMMUNITY)
Admission: EM | Admit: 2015-01-08 | Discharge: 2015-01-09 | Disposition: A | Discharge status: Home / Self Care | Payer: 59 | Attending: Emergency Medicine | Admitting: Emergency Medicine

## 2015-01-08 ENCOUNTER — Encounter (HOSPITAL_COMMUNITY): Payer: Self-pay | Admitting: *Deleted

## 2015-01-08 DIAGNOSIS — Z79899 Other long term (current) drug therapy: Secondary | ICD-10-CM | POA: Diagnosis not present

## 2015-01-08 DIAGNOSIS — T8189XA Other complications of procedures, not elsewhere classified, initial encounter: Secondary | ICD-10-CM | POA: Diagnosis not present

## 2015-01-08 DIAGNOSIS — Z720 Tobacco use: Secondary | ICD-10-CM | POA: Insufficient documentation

## 2015-01-08 DIAGNOSIS — I10 Essential (primary) hypertension: Secondary | ICD-10-CM | POA: Diagnosis not present

## 2015-01-08 DIAGNOSIS — Z7982 Long term (current) use of aspirin: Secondary | ICD-10-CM | POA: Insufficient documentation

## 2015-01-08 DIAGNOSIS — Y838 Other surgical procedures as the cause of abnormal reaction of the patient, or of later complication, without mention of misadventure at the time of the procedure: Secondary | ICD-10-CM | POA: Insufficient documentation

## 2015-01-08 DIAGNOSIS — T819XXA Unspecified complication of procedure, initial encounter: Secondary | ICD-10-CM

## 2015-01-08 DIAGNOSIS — Z4801 Encounter for change or removal of surgical wound dressing: Secondary | ICD-10-CM | POA: Diagnosis present

## 2015-01-08 DIAGNOSIS — E119 Type 2 diabetes mellitus without complications: Secondary | ICD-10-CM | POA: Insufficient documentation

## 2015-01-08 LAB — CBC WITH DIFFERENTIAL/PLATELET
Basophils Absolute: 0 10*3/uL (ref 0.0–0.1)
Basophils Relative: 0 % (ref 0–1)
EOS ABS: 0.1 10*3/uL (ref 0.0–0.7)
EOS PCT: 1 % (ref 0–5)
HCT: 40.6 % (ref 39.0–52.0)
Hemoglobin: 14.1 g/dL (ref 13.0–17.0)
LYMPHS ABS: 2.5 10*3/uL (ref 0.7–4.0)
Lymphocytes Relative: 33 % (ref 12–46)
MCH: 29.1 pg (ref 26.0–34.0)
MCHC: 34.7 g/dL (ref 30.0–36.0)
MCV: 83.9 fL (ref 78.0–100.0)
MONOS PCT: 6 % (ref 3–12)
Monocytes Absolute: 0.5 10*3/uL (ref 0.1–1.0)
Neutro Abs: 4.5 10*3/uL (ref 1.7–7.7)
Neutrophils Relative %: 60 % (ref 43–77)
Platelets: 309 10*3/uL (ref 150–400)
RBC: 4.84 MIL/uL (ref 4.22–5.81)
RDW: 14.9 % (ref 11.5–15.5)
WBC: 7.5 10*3/uL (ref 4.0–10.5)

## 2015-01-08 LAB — COMPREHENSIVE METABOLIC PANEL
ALBUMIN: 2.9 g/dL — AB (ref 3.5–5.0)
ALT: 10 U/L — AB (ref 17–63)
ANION GAP: 9 (ref 5–15)
AST: 17 U/L (ref 15–41)
Alkaline Phosphatase: 58 U/L (ref 38–126)
BUN: <5 mg/dL — ABNORMAL LOW (ref 6–20)
CO2: 22 mmol/L (ref 22–32)
Calcium: 8.9 mg/dL (ref 8.9–10.3)
Chloride: 103 mmol/L (ref 101–111)
Creatinine, Ser: 0.98 mg/dL (ref 0.61–1.24)
GFR calc non Af Amer: >60 mL/min (ref >60)
Glucose, Bld: 116 mg/dL — ABNORMAL HIGH (ref 65–99)
Potassium: 4.3 mmol/L (ref 3.5–5.1)
Sodium: 134 mmol/L — ABNORMAL LOW (ref 135–145)
Total Bilirubin: 0.1 mg/dL — ABNORMAL LOW (ref 0.3–1.2)
Total Protein: 7.3 g/dL (ref 6.5–8.1)

## 2015-01-08 NOTE — ED Notes (Signed)
The pt has herniated lumbar disc surgery may 10th.  For one week he has had more drainage from his incision.  He saw his doctor Monday who told him he needed to see a wound specialist.  No temp  No pain in that area

## 2015-01-08 NOTE — ED Notes (Signed)
Yellow fluid noted through gauze on patient back. Replaced gauze PA made aware.

## 2015-01-08 NOTE — ED Notes (Signed)
No bleeding noted at this time at the site. Patient does not complain of pain.

## 2015-01-08 NOTE — ED Notes (Signed)
Patient placed on 2L o2 sats 88% patient now 97% on 2L

## 2015-01-09 NOTE — ED Provider Notes (Signed)
CSN: [id number]     Arrival date & time 01/08/15  1740 History   First MD Initiated Contact with Patient 01/08/15 2125     Chief Complaint  Patient presents with  . Wound Check     (Consider location/radiation/quality/duration/timing/severity/associated sxs/prior Treatment) HPI Patient presents to the emergency department with an open area to the surgical incision site that has been draining over the last week.  The patient states that he saw Dr. Gerlene Fee on Monday and is unsure why the wound is open and draining at this point.  Patient comes in tonight due to some mild increase in pain and felt like the wound was draining more.  Patient states he developed nausea, vomiting, fever, chest pain, shortness of breath, weakness, numbness, dizziness, syncope, states nothing seems make his condition, better or worse Past Medical History  Diagnosis Date  . Hypertension   . Diabetes mellitus without complication    Past Surgical History  Procedure Laterality Date  . Cholecystectomy     Family History  Problem Relation Age of Onset  . Stroke Mother   . Heart failure Father    History  Substance Use Topics  . Smoking status: Current Every Day Smoker -- 1.00 packs/day    Types: Cigarettes  . Smokeless tobacco: Never Used  . Alcohol Use: Yes     Comment: social    Review of Systems  All other systems negative except as documented in the HPI. All pertinent positives and negatives as reviewed in the HPI.  Allergies  Review of patient's allergies indicates no known allergies.  Home Medications   Prior to Admission medications   Medication Sig Start Date End Date Taking? Authorizing Provider  aspirin EC 81 MG tablet Take 81 mg by mouth daily.   Yes Historical Provider, MD  metoprolol tartrate (LOPRESSOR) 25 MG tablet Take 25 mg by mouth daily.    Yes Historical Provider, MD  Oxycodone HCl 10 MG TABS Take 10 mg by mouth every 6 (six) hours. 01/02/15  Yes Historical Provider, MD   BP  129/82 mmHg  Pulse 62  Temp(Src) 98.2 F (36.8 C) (Oral)  Resp 20  SpO2 99% Physical Exam  Constitutional: He is oriented to person, place, and time. He appears well-developed and well-nourished. No distress.  HENT:  Head: Normocephalic and atraumatic.  Eyes: Pupils are equal, round, and reactive to light.  Cardiovascular: Normal rate, regular rhythm and normal heart sounds.  Exam reveals no gallop and no friction rub.   No murmur heard. Pulmonary/Chest: Effort normal and breath sounds normal. No respiratory distress.  Musculoskeletal:       Back:  Neurological: He is alert and oriented to person, place, and time. He exhibits normal muscle tone. Coordination normal.  Skin: Skin is warm and dry. No rash noted. No erythema.  Nursing note and vitals reviewed.   ED Course  Procedures (including critical care time) Labs Review Labs Reviewed  COMPREHENSIVE METABOLIC PANEL - Abnormal; Notable for the following:    Sodium 134 (*)    Glucose, Bld 116 (*)    BUN <5 (*)    Albumin 2.9 (*)    ALT 10 (*)    Total Bilirubin 0.1 (*)    All other components within normal limits  CBC WITH DIFFERENTIAL/PLATELET      I spoke with Dr. Mikal Plane neurosurgery, who advised to have the patient call first thing in the morning and be seen in the office tomorrow.  The patient agrees the plan and all  questions were answered   Charlestine Night, PA-C 01/09/15 0012  Linwood Dibbles, MD 01/09/15 [phone]

## 2015-01-09 NOTE — Discharge Instructions (Signed)
Call Dr. Rosanne Sack office first thing in the morning at 8 AM and tell them that we spoke with the on-call doctor, Dr. Mikal Plane.  He stated that she needed to be seen in the office tomorrow

## 2016-03-02 ENCOUNTER — Other Ambulatory Visit: Payer: Self-pay | Admitting: Physician Assistant

## 2016-03-02 ENCOUNTER — Encounter: Payer: Self-pay | Admitting: Physician Assistant

## 2016-03-02 ENCOUNTER — Ambulatory Visit: Payer: Self-pay | Admitting: Physician Assistant

## 2016-03-02 VITALS — BP 126/80 | HR 68 | Temp 97.5°F | Ht 72.0 in | Wt 155.4 lb

## 2016-03-02 DIAGNOSIS — F32A Depression, unspecified: Secondary | ICD-10-CM | POA: Insufficient documentation

## 2016-03-02 DIAGNOSIS — Z1211 Encounter for screening for malignant neoplasm of colon: Secondary | ICD-10-CM

## 2016-03-02 DIAGNOSIS — F17299 Nicotine dependence, other tobacco product, with unspecified nicotine-induced disorders: Secondary | ICD-10-CM

## 2016-03-02 DIAGNOSIS — F329 Major depressive disorder, single episode, unspecified: Secondary | ICD-10-CM

## 2016-03-02 DIAGNOSIS — G8929 Other chronic pain: Secondary | ICD-10-CM | POA: Insufficient documentation

## 2016-03-02 DIAGNOSIS — R634 Abnormal weight loss: Secondary | ICD-10-CM

## 2016-03-02 DIAGNOSIS — Z131 Encounter for screening for diabetes mellitus: Secondary | ICD-10-CM

## 2016-03-02 DIAGNOSIS — F17200 Nicotine dependence, unspecified, uncomplicated: Secondary | ICD-10-CM | POA: Insufficient documentation

## 2016-03-02 DIAGNOSIS — Z13220 Encounter for screening for lipoid disorders: Secondary | ICD-10-CM

## 2016-03-02 DIAGNOSIS — Z125 Encounter for screening for malignant neoplasm of prostate: Secondary | ICD-10-CM

## 2016-03-02 DIAGNOSIS — I10 Essential (primary) hypertension: Secondary | ICD-10-CM

## 2016-03-02 LAB — GLUCOSE, POCT (MANUAL RESULT ENTRY): POC Glucose: 138 mg/dl — AB (ref 70–99)

## 2016-03-02 MED ORDER — DULOXETINE HCL 30 MG PO CPEP: ORAL_CAPSULE | ORAL | 0 refills | Status: DC

## 2016-03-02 MED ORDER — METOPROLOL TARTRATE 25 MG PO TABS: 25.0000 mg | ORAL_TABLET | Freq: Two times a day (BID) | ORAL | 1 refills | Status: DC

## 2016-03-02 NOTE — Progress Notes (Signed)
BP 126/80 (BP Location: Left Arm, Patient Position: Sitting, Cuff Size: Normal)   Pulse 68   Temp 97.5 F (36.4 C) (Other (Comment))   Ht 6' (1.829 m)   Wt 155 lb 6.4 oz (70.5 kg)   SpO2 99%   BMI 21.08 kg/m    Subjective:    Patient ID: Michael Rivas, male    DOB: 10-05-1955, 60 y.o.   MRN: [id number]  HPI: Michael Rivas is a 60 y.o. male presenting on 03/02/2016 for New Patient (Initial Visit)   HPI  Pt previously went to [facility] for PCP.  Last time he was seen was about 4-5 months ago.  Pt worked at [employer] until he had back surgery last year. He says the reason for the surgery was because it was hurting so bad.    Pt says he lost 10 pounds since his back surgery but says he isn't eating that much and is somewhat depressed.  Denies SI, HI.  Pt says he is out of his oxycodones.  He says he is out of his aspirin also.  Pt says he was started on aspirin about 10 year ago when he was having CP but he says nothing was wrong with his heart.  He says they didn't do any tests.  Pt never got colonoscopy.    Relevant past medical, surgical, family and social history reviewed and updated as indicated. Interim medical history since our last visit reviewed. Allergies and medications reviewed and updated.   Current Outpatient Prescriptions:  .  aspirin EC 81 MG tablet, Take 81 mg by mouth daily., Disp: , Rfl:  .  metoprolol tartrate (LOPRESSOR) 25 MG tablet, Take 25 mg by mouth daily. , Disp: , Rfl:  .  Oxycodone HCl 10 MG TABS, Take 10 mg by mouth every 6 (six) hours., Disp: , Rfl: 0  Review of Systems  Constitutional: Positive for unexpected weight change. Negative for appetite change, chills, diaphoresis, fatigue and fever.  HENT: Negative for congestion, drooling, ear pain, facial swelling, hearing loss, mouth sores, sneezing, sore throat, trouble swallowing and voice change.   Eyes: Negative for pain, discharge, redness, itching and visual disturbance.  Respiratory:  Negative for cough, choking, shortness of breath and wheezing.   Cardiovascular: Negative for chest pain, palpitations and leg swelling.  Gastrointestinal: Negative for abdominal pain, blood in stool, constipation, diarrhea and vomiting.  Endocrine: Negative for cold intolerance, heat intolerance and polydipsia.  Genitourinary: Negative for decreased urine volume, dysuria and hematuria.  Musculoskeletal: Positive for arthralgias, back pain and gait problem.  Skin: Negative for rash.  Allergic/Immunologic: Negative for environmental allergies.  Neurological: Negative for seizures, syncope, light-headedness and headaches.  Hematological: Negative for adenopathy.  Psychiatric/Behavioral: Positive for dysphoric mood. Negative for agitation and suicidal ideas. The patient is not nervous/anxious.     Per HPI unless specifically indicated above     Objective:    BP 126/80 (BP Location: Left Arm, Patient Position: Sitting, Cuff Size: Normal)   Pulse 68   Temp 97.5 F (36.4 C) (Other (Comment))   Ht 6' (1.829 m)   Wt 155 lb 6.4 oz (70.5 kg)   SpO2 99%   BMI 21.08 kg/m   Wt Readings from Last 3 Encounters:  03/02/16 155 lb 6.4 oz (70.5 kg)  11/05/14 165 lb (74.8 kg)  11/05/14 165 lb (74.8 kg)    GAD 7 score 18 PHQ 9 score 23  Physical Exam  Constitutional: He is oriented to person, place, and time. He appears  well-developed and well-nourished.  HENT:  Head: Normocephalic and atraumatic.  Mouth/Throat: Oropharynx is clear and moist. No oropharyngeal exudate.  Eyes: Conjunctivae and EOM are normal. Pupils are equal, round, and reactive to light.  Neck: Neck supple. No thyromegaly present.  Cardiovascular: Normal rate and regular rhythm.   Pulmonary/Chest: Effort normal and breath sounds normal. He has no wheezes. He has no rales.  Abdominal: Soft. Bowel sounds are normal. He exhibits no mass. There is no hepatosplenomegaly. There is no tenderness.  Musculoskeletal: He exhibits no  edema.  Lymphadenopathy:    He has no cervical adenopathy.  Neurological: He is alert and oriented to person, place, and time.  Skin: Skin is warm and dry. No rash noted.  Psychiatric: He has a normal mood and affect. His behavior is normal. Thought content normal.  Vitals reviewed.   Results for orders placed or performed in visit on 03/02/16  POCT Glucose (CBG)  Result Value Ref Range   POC Glucose 138 (A) 70 - 99 mg/dl      Assessment & Plan:    Encounter Diagnoses  Name Primary?  . Essential hypertension, benign Yes  . Other tobacco product nicotine dependence with nicotine-induced disorder   . Depression   . Chronic pain   . Screening for diabetes mellitus (DM)   . Screening cholesterol level   . Screening for prostate cancer   . Special screening for malignant neoplasms, colon   . Loss of weight     -get Baseline labs drawn tomorrow morning -gave iFOBT for pt to return to office for colon cancer screening -will rx cymbalta due to pt with chronic pain and depression.  He will get signed up with [facility] for this medication   -discussed with pt that [facility] is not a pain clinic and we do not rx narcotics. Discussed that the cymbalta should help. If needed can add gabapenin  -Take metoprolol bid-  -pt counseled on moderation with etoh.  Discussed that 6 beers is too much for any one day- recommended no more than 2/d. Also that he should not drink alcohol at all when he starts the cymbalta. -counseled on smoking cessation -f/u 1 month.  RTO sooner prn

## 2016-03-02 NOTE — Patient Instructions (Addendum)
-  Get labs/blood drawn tomorrow morning before you eat -Return stool/BM test to office -Metoprolol has been sent to [facility].  Take it twice daily. -cymbalta will come in the mail.  Take it once daily for a week.  If doing okay with it, you can increase to twice daily if you need more pain control.

## 2016-03-03 LAB — LIPID PANEL
CHOL/HDL RATIO: 3.8 ratio (ref <=5.0)
Cholesterol: 165 mg/dL (ref 125–200)
HDL: 44 mg/dL (ref >=40)
LDL Cholesterol: 108 mg/dL (ref <130)
Triglycerides: 65 mg/dL (ref <150)
VLDL: 13 mg/dL (ref <30)

## 2016-03-03 LAB — COMPLETE METABOLIC PANEL WITH GFR
ALT: 7 U/L — ABNORMAL LOW (ref 9–46)
AST: 13 U/L (ref 10–35)
Albumin: 4 g/dL (ref 3.6–5.1)
Alkaline Phosphatase: 61 U/L (ref 40–115)
BUN: 12 mg/dL (ref 7–25)
CO2: 23 mmol/L (ref 20–31)
Calcium: 9.5 mg/dL (ref 8.6–10.3)
Chloride: 106 mmol/L (ref 98–110)
Creat: 1.12 mg/dL (ref 0.70–1.33)
GFR, Est African American: 84 mL/min (ref >=60)
GFR, Est Non African American: 73 mL/min (ref >=60)
Glucose, Bld: 118 mg/dL — ABNORMAL HIGH (ref 65–99)
POTASSIUM: 4.6 mmol/L (ref 3.5–5.3)
Sodium: 139 mmol/L (ref 135–146)
Total Bilirubin: 0.4 mg/dL (ref 0.2–1.2)
Total Protein: 7.5 g/dL (ref 6.1–8.1)

## 2016-03-03 LAB — CBC WITH DIFFERENTIAL/PLATELET
Basophils Absolute: 0 cells/uL (ref 0–200)
Basophils Relative: 0 %
Eosinophils Absolute: 178 cells/uL (ref 15–500)
Eosinophils Relative: 2 %
HCT: 46.7 % (ref 38.5–50.0)
HEMOGLOBIN: 15.5 g/dL (ref 13.2–17.1)
LYMPHS ABS: 3204 {cells}/uL (ref 850–3900)
LYMPHS PCT: 36 %
MCH: 29 pg (ref 27.0–33.0)
MCHC: 33.2 g/dL (ref 32.0–36.0)
MCV: 87.3 fL (ref 80.0–100.0)
MPV: 11.2 fL (ref 7.5–12.5)
Monocytes Absolute: 623 cells/uL (ref 200–950)
Monocytes Relative: 7 %
Neutro Abs: 4895 cells/uL (ref 1500–7800)
Neutrophils Relative %: 55 %
PLATELETS: 225 10*3/uL (ref 140–400)
RBC: 5.35 MIL/uL (ref 4.20–5.80)
RDW: 15.5 % — AB (ref 11.0–15.0)
WBC: 8.9 10*3/uL (ref 3.8–10.8)

## 2016-03-03 LAB — TSH: TSH: 1.2 m[IU]/L (ref 0.40–4.50)

## 2016-03-04 LAB — PSA: PSA: 1.86 ng/mL (ref <=4.00)

## 2016-04-06 ENCOUNTER — Encounter: Payer: Self-pay | Admitting: Physician Assistant

## 2016-04-06 ENCOUNTER — Ambulatory Visit: Payer: Self-pay | Admitting: Physician Assistant

## 2016-04-06 VITALS — BP 126/70 | HR 65 | Temp 97.5°F | Ht 72.0 in | Wt 155.5 lb

## 2016-04-06 DIAGNOSIS — F17299 Nicotine dependence, other tobacco product, with unspecified nicotine-induced disorders: Secondary | ICD-10-CM

## 2016-04-06 DIAGNOSIS — F32A Depression, unspecified: Secondary | ICD-10-CM

## 2016-04-06 DIAGNOSIS — G8929 Other chronic pain: Secondary | ICD-10-CM

## 2016-04-06 DIAGNOSIS — F329 Major depressive disorder, single episode, unspecified: Secondary | ICD-10-CM

## 2016-04-06 DIAGNOSIS — I10 Essential (primary) hypertension: Secondary | ICD-10-CM

## 2016-04-06 MED ORDER — GABAPENTIN 300 MG PO CAPS: 300.0000 mg | ORAL_CAPSULE | Freq: Two times a day (BID) | ORAL | 1 refills | Status: DC

## 2016-04-06 NOTE — Progress Notes (Signed)
BP 126/70 (BP Location: Left Arm, Patient Position: Sitting, Cuff Size: Normal)   Pulse 65   Temp 97.5 F (36.4 C)   Ht 6' (1.829 m)   Wt 155 lb 8 oz (70.5 kg)   SpO2 97%   BMI 21.09 kg/m    Subjective:    Patient ID: Michael Rivas, male    DOB: [birth year], 60 y.o.   MRN: [id number]  HPI: Michael Rivas is a 60 y.o. male presenting on 04/06/2016 for Mental Health Problem and Pain   HPI   Pt states he cut back beer to 1 or 2 on the weekend.  He says the cymbalta isn't helping his back pain.  Pt states he doesn't think it has helped his mood either.   Relevant past medical, surgical, family and social history reviewed and updated as indicated. Interim medical history since our last visit reviewed. Allergies and medications reviewed and updated.  Current Outpatient Prescriptions:  .  aspirin EC 81 MG tablet, Take 81 mg by mouth daily., Disp: , Rfl:  .  DULoxetine (CYMBALTA) 30 MG capsule, 1 po qd x one week.  If tolerating medication after one week, can increase to 1 po bid, Disp: 180 capsule, Rfl: 0 .  metoprolol tartrate (LOPRESSOR) 25 MG tablet, Take 1 tablet (25 mg total) by mouth 2 (two) times daily., Disp: 60 tablet, Rfl: 1  Review of Systems  Constitutional: Negative for appetite change, chills, diaphoresis, fatigue, fever and unexpected weight change.  HENT: Negative for congestion, drooling, ear pain, facial swelling, hearing loss, mouth sores, sneezing, sore throat, trouble swallowing and voice change.   Eyes: Negative for pain, discharge, redness, itching and visual disturbance.  Respiratory: Negative for cough, choking, shortness of breath and wheezing.   Cardiovascular: Negative for chest pain, palpitations and leg swelling.  Gastrointestinal: Negative for abdominal pain, blood in stool, constipation, diarrhea and vomiting.  Endocrine: Negative for cold intolerance, heat intolerance and polydipsia.  Genitourinary: Negative for decreased urine volume, dysuria and  hematuria.  Musculoskeletal: Positive for back pain and gait problem. Negative for arthralgias.  Skin: Negative for rash.  Allergic/Immunologic: Negative for environmental allergies.  Neurological: Negative for seizures, syncope, light-headedness and headaches.  Hematological: Negative for adenopathy.  Psychiatric/Behavioral: Negative for agitation, dysphoric mood and suicidal ideas. The patient is not nervous/anxious.     Per HPI unless specifically indicated above     Objective:    BP 126/70 (BP Location: Left Arm, Patient Position: Sitting, Cuff Size: Normal)   Pulse 65   Temp 97.5 F (36.4 C)   Ht 6' (1.829 m)   Wt 155 lb 8 oz (70.5 kg)   SpO2 97%   BMI 21.09 kg/m   Wt Readings from Last 3 Encounters:  04/06/16 155 lb 8 oz (70.5 kg)  03/02/16 155 lb 6.4 oz (70.5 kg)  11/05/14 165 lb (74.8 kg)    Physical Exam  Constitutional: He is oriented to person, place, and time. He appears well-developed and well-nourished.  HENT:  Head: Normocephalic and atraumatic.  Neck: Neck supple.  Cardiovascular: Normal rate and regular rhythm.   Pulmonary/Chest: Effort normal and breath sounds normal. He has no wheezes.  Abdominal: Soft. Bowel sounds are normal. There is no hepatosplenomegaly. There is no tenderness.  Musculoskeletal: He exhibits no edema.  Lymphadenopathy:    He has no cervical adenopathy.  Neurological: He is alert and oriented to person, place, and time.  Skin: Skin is warm and dry.  Psychiatric: He has a normal mood and  affect. His behavior is normal.  Vitals reviewed.   Results for orders placed or performed in visit on 03/02/16  Lipid Profile  Result Value Ref Range   Cholesterol 165 125 - 200 mg/dL   Triglycerides 65 <150 mg/dL   HDL 44 >=40 mg/dL   Total CHOL/HDL Ratio 3.8 <=5.0 Ratio   VLDL 13 <30 mg/dL   LDL Cholesterol 108 <130 mg/dL  COMPLETE METABOLIC PANEL WITH GFR  Result Value Ref Range   Sodium 139 135 - 146 mmol/L   Potassium 4.6 3.5 - 5.3  mmol/L   Chloride 106 98 - 110 mmol/L   CO2 23 20 - 31 mmol/L   Glucose, Bld 118 (H) 65 - 99 mg/dL   BUN 12 7 - 25 mg/dL   Creat 1.12 0.70 - 1.33 mg/dL   Total Bilirubin 0.4 0.2 - 1.2 mg/dL   Alkaline Phosphatase 61 40 - 115 U/L   AST 13 10 - 35 U/L   ALT 7 (L) 9 - 46 U/L   Total Protein 7.5 6.1 - 8.1 g/dL   Albumin 4.0 3.6 - 5.1 g/dL   Calcium 9.5 8.6 - 10.3 mg/dL   GFR, Est African American 84 >=60 mL/min   GFR, Est Non African American 73 >=60 mL/min  CBC w/Diff/Platelet  Result Value Ref Range   WBC 8.9 3.8 - 10.8 K/uL   RBC 5.35 4.20 - 5.80 MIL/uL   Hemoglobin 15.5 13.2 - 17.1 g/dL   HCT 46.7 38.5 - 50.0 %   MCV 87.3 80.0 - 100.0 fL   MCH 29.0 27.0 - 33.0 pg   MCHC 33.2 32.0 - 36.0 g/dL   RDW 15.5 (H) 11.0 - 15.0 %   Platelets 225 140 - 400 K/uL   MPV 11.2 7.5 - 12.5 fL   Neutro Abs 4,895 1,500 - 7,800 cells/uL   Lymphs Abs 3,204 850 - 3,900 cells/uL   Monocytes Absolute 623 200 - 950 cells/uL   Eosinophils Absolute 178 15 - 500 cells/uL   Basophils Absolute 0 0 - 200 cells/uL   Neutrophils Relative % 55 %   Lymphocytes Relative 36 %   Monocytes Relative 7 %   Eosinophils Relative 2 %   Basophils Relative 0 %   Smear Review Criteria for review not met   PSA  Result Value Ref Range   PSA 1.86 <=4.00 ng/mL  TSH  Result Value Ref Range   TSH 1.20 0.40 - 4.50 mIU/L  POCT Glucose (CBG)  Result Value Ref Range   POC Glucose 138 (A) 70 - 99 mg/dl      Assessment & Plan:   Encounter Diagnoses  Name Primary?  . Chronic pain Yes  . Essential hypertension, benign   . Depression   . Other tobacco product nicotine dependence with nicotine-induced disorder     -Reviewed labs with pt -Pt to return iFOBT -Obtain most recent office note from Dr Hal Neer [facility] Neurosurgery- record request sent -Add gabapentin -f/u 1 month

## 2016-04-28 LAB — IFOBT (OCCULT BLOOD): IFOBT: NEGATIVE

## 2016-05-05 ENCOUNTER — Ambulatory Visit: Payer: Self-pay | Admitting: Physician Assistant

## 2016-05-06 ENCOUNTER — Encounter: Payer: Self-pay | Admitting: Physician Assistant

## 2016-05-13 ENCOUNTER — Encounter: Payer: Self-pay | Admitting: Physician Assistant

## 2016-05-13 ENCOUNTER — Ambulatory Visit: Payer: Self-pay | Admitting: Physician Assistant

## 2016-05-13 VITALS — BP 116/70 | HR 76 | Temp 97.5°F | Ht 72.0 in | Wt 158.0 lb

## 2016-05-13 DIAGNOSIS — F32A Depression, unspecified: Secondary | ICD-10-CM

## 2016-05-13 DIAGNOSIS — F329 Major depressive disorder, single episode, unspecified: Secondary | ICD-10-CM

## 2016-05-13 DIAGNOSIS — I10 Essential (primary) hypertension: Secondary | ICD-10-CM

## 2016-05-13 DIAGNOSIS — F17210 Nicotine dependence, cigarettes, uncomplicated: Secondary | ICD-10-CM

## 2016-05-13 DIAGNOSIS — G8929 Other chronic pain: Secondary | ICD-10-CM

## 2016-05-13 MED ORDER — GABAPENTIN 300 MG PO CAPS: 300.0000 mg | ORAL_CAPSULE | Freq: Two times a day (BID) | ORAL | 1 refills | Status: DC

## 2016-05-13 MED ORDER — LISINOPRIL 10 MG PO TABS: 10.0000 mg | ORAL_TABLET | Freq: Every day | ORAL | 3 refills | Status: DC

## 2016-05-13 NOTE — Patient Instructions (Signed)
Stop metoprolol and start lisinopril for your blood pressure  Wean gradually off the cymbalta  Start gabapentin when it comes in the mail

## 2016-05-13 NOTE — Progress Notes (Signed)
BP 116/70 (BP Location: Left Arm, Patient Position: Sitting, Cuff Size: Normal)   Pulse 76   Temp 97.5 F (36.4 C)   Ht 6' (1.829 m)   Wt 158 lb (71.7 kg)   SpO2 99%   BMI 21.43 kg/m    Subjective:    Patient ID: Michael Rivas, male    DOB: 05/24/1956, 60 y.o.   MRN: [id number]  HPI: Michael Rivas is a 60 y.o. male presenting on 05/13/2016 for Follow-up   HPI   Pt says cymbalta isn't helping at all He didn't get the gabapentin filled yet due to no $$  He says he drinks "maybe a beer or 2 a week"  Pt is still having depression.  He doesn't want to go anywhere for therapy.    Pt c/o erection problems on the metoprolol  Relevant past medical, surgical, family and social history reviewed and updated as indicated. Interim medical history since our last visit reviewed. Allergies and medications reviewed and updated.   Current Outpatient Prescriptions:  .  aspirin EC 81 MG tablet, Take 81 mg by mouth daily., Disp: , Rfl:  .  DULoxetine (CYMBALTA) 30 MG capsule, 1 po qd x one week.  If tolerating medication after one week, can increase to 1 po bid, Disp: 180 capsule, Rfl: 0 .  metoprolol tartrate (LOPRESSOR) 25 MG tablet, Take 1 tablet (25 mg total) by mouth 2 (two) times daily., Disp: 60 tablet, Rfl: 1 .  gabapentin (NEURONTIN) 300 MG capsule, Take 1 capsule (300 mg total) by mouth 2 (two) times daily. (Patient not taking: Reported on 05/13/2016), Disp: 60 capsule, Rfl: 1   Review of Systems  Constitutional: Negative for appetite change, chills, diaphoresis, fatigue, fever and unexpected weight change.  HENT: Positive for ear pain. Negative for congestion, dental problem, drooling, facial swelling, hearing loss, mouth sores, sneezing, sore throat, trouble swallowing and voice change.   Eyes: Negative for pain, discharge, redness, itching and visual disturbance.  Respiratory: Negative for cough, choking, shortness of breath and wheezing.   Cardiovascular: Negative for chest  pain, palpitations and leg swelling.  Gastrointestinal: Negative for abdominal pain, blood in stool, constipation, diarrhea and vomiting.  Endocrine: Negative for cold intolerance, heat intolerance and polydipsia.  Genitourinary: Negative for decreased urine volume, dysuria and hematuria.  Musculoskeletal: Positive for back pain and gait problem. Negative for arthralgias.  Skin: Negative for rash.  Allergic/Immunologic: Negative for environmental allergies.  Neurological: Negative for seizures, syncope, light-headedness and headaches.  Hematological: Negative for adenopathy.  Psychiatric/Behavioral: Negative for agitation, dysphoric mood and suicidal ideas. The patient is not nervous/anxious.     Per HPI unless specifically indicated above     Objective:    BP 116/70 (BP Location: Left Arm, Patient Position: Sitting, Cuff Size: Normal)   Pulse 76   Temp 97.5 F (36.4 C)   Ht 6' (1.829 m)   Wt 158 lb (71.7 kg)   SpO2 99%   BMI 21.43 kg/m   Wt Readings from Last 3 Encounters:  05/13/16 158 lb (71.7 kg)  04/06/16 155 lb 8 oz (70.5 kg)  03/02/16 155 lb 6.4 oz (70.5 kg)    Physical Exam  Constitutional: He is oriented to person, place, and time. He appears well-developed and well-nourished.  HENT:  Head: Normocephalic and atraumatic.  Neck: Neck supple.  Cardiovascular: Normal rate and regular rhythm.   Pulmonary/Chest: Effort normal and breath sounds normal. He has no wheezes.  Abdominal: Soft. Bowel sounds are normal. There is no hepatosplenomegaly.  There is no tenderness.  Musculoskeletal: He exhibits no edema.  Lymphadenopathy:    He has no cervical adenopathy.  Neurological: He is alert and oriented to person, place, and time.  Skin: Skin is warm and dry.  Psychiatric: He has a normal mood and affect. His behavior is normal.  Vitals reviewed.   Results for orders placed or performed in visit on 03/02/16  IFOBT POC (occult bld, rslt in office)  Result Value Ref Range    IFOBT Negative       Assessment & Plan:    Encounter Diagnoses  Name Primary?  . Essential hypertension, benign Yes  . Other chronic pain   . Depression, unspecified depression type   . Cigarette nicotine dependence without complication     -Stop cymbalta due to is isn't helping mood or pain.  Counseled on weaning off it. -rx for gabapentin sent to [facility] -Counseled smoking cessation -discontinue the metoprolol in light of erectile difficulties.  Start lisinopril for the blood pressure -F/u 1 month recheck bp. RTO sooner prn

## 2016-05-27 ENCOUNTER — Telehealth: Payer: Self-pay | Admitting: Physician Assistant

## 2016-05-27 NOTE — Telephone Encounter (Signed)
Pt called office c/o abdominal pain due to taking lisinopril. Pt denied diarrhea and emesis. PA advised pt go back to taking metoprolol and will discuss further at next OV. New prescription for metoprolol to be sent to [facility]

## 2016-06-14 ENCOUNTER — Encounter: Payer: Self-pay | Admitting: Physician Assistant

## 2016-06-14 ENCOUNTER — Ambulatory Visit: Payer: Self-pay | Admitting: Physician Assistant

## 2016-06-14 VITALS — BP 122/84 | HR 68 | Temp 97.3°F | Ht 72.0 in | Wt 157.0 lb

## 2016-06-14 DIAGNOSIS — F17210 Nicotine dependence, cigarettes, uncomplicated: Secondary | ICD-10-CM

## 2016-06-14 DIAGNOSIS — I10 Essential (primary) hypertension: Secondary | ICD-10-CM

## 2016-06-14 DIAGNOSIS — G8929 Other chronic pain: Secondary | ICD-10-CM

## 2016-06-14 MED ORDER — AMLODIPINE BESYLATE 5 MG PO TABS: 5.0000 mg | ORAL_TABLET | Freq: Every day | ORAL | 1 refills | Status: DC

## 2016-06-14 NOTE — Progress Notes (Signed)
BP 122/84 (BP Location: Left Arm, Patient Position: Sitting, Cuff Size: Normal)   Pulse 68   Temp 97.3 F (36.3 C) (Other (Comment))   Ht 6' (1.829 m)   Wt 157 lb (71.2 kg)   SpO2 99%   BMI 21.29 kg/m    Subjective:    Patient ID: Michael Rivas, male    DOB: August 15, 1955, 60 y.o.   MRN: [id number]  HPI: Michael Rivas is a 60 y.o. male presenting on 06/14/2016 for Follow-up   HPI   Pt got lisinopril and gabapentin.  He stopped taking both of these because he said they made his stomach hurt.  He went back to taking his metoprolol.  Notes from neurosurgeon state MRI stable and recommended injections/chronic pain management   Relevant past medical, surgical, family and social history reviewed and updated as indicated. Interim medical history since our last visit reviewed. Allergies and medications reviewed and updated.   Current Outpatient Prescriptions:  .  aspirin EC 81 MG tablet, Take 81 mg by mouth daily., Disp: , Rfl:  .  metoprolol tartrate (LOPRESSOR) 25 MG tablet, Take 1 tablet (25 mg total) by mouth 2 (two) times daily., Disp: 60 tablet, Rfl: 1   Review of Systems  Constitutional: Negative for appetite change, chills, diaphoresis, fatigue, fever and unexpected weight change.  HENT: Negative for congestion, dental problem, drooling, ear pain, facial swelling, hearing loss, mouth sores, sneezing, sore throat, trouble swallowing and voice change.   Eyes: Negative for pain, discharge, redness, itching and visual disturbance.  Respiratory: Negative for cough, choking, shortness of breath and wheezing.   Cardiovascular: Negative for chest pain, palpitations and leg swelling.  Gastrointestinal: Positive for constipation. Negative for abdominal pain, blood in stool, diarrhea and vomiting.  Endocrine: Negative for cold intolerance, heat intolerance and polydipsia.  Genitourinary: Negative for decreased urine volume, dysuria and hematuria.  Musculoskeletal: Positive for back  pain and gait problem. Negative for arthralgias.  Skin: Negative for rash.  Allergic/Immunologic: Negative for environmental allergies.  Neurological: Negative for seizures, syncope, light-headedness and headaches.  Hematological: Negative for adenopathy.  Psychiatric/Behavioral: Positive for dysphoric mood. Negative for agitation and suicidal ideas. The patient is not nervous/anxious.     Per HPI unless specifically indicated above     Objective:    BP 122/84 (BP Location: Left Arm, Patient Position: Sitting, Cuff Size: Normal)   Pulse 68   Temp 97.3 F (36.3 C) (Other (Comment))   Ht 6' (1.829 m)   Wt 157 lb (71.2 kg)   SpO2 99%   BMI 21.29 kg/m   Wt Readings from Last 3 Encounters:  06/14/16 157 lb (71.2 kg)  05/13/16 158 lb (71.7 kg)  04/06/16 155 lb 8 oz (70.5 kg)    Physical Exam  Constitutional: He is oriented to person, place, and time. He appears well-developed and well-nourished.  HENT:  Head: Normocephalic and atraumatic.  Neck: Neck supple.  Cardiovascular: Normal rate and regular rhythm.   Pulmonary/Chest: Effort normal and breath sounds normal. He has no wheezes.  Abdominal: Soft. Bowel sounds are normal. There is no hepatosplenomegaly. There is no tenderness.  Musculoskeletal: He exhibits no edema.  Lymphadenopathy:    He has no cervical adenopathy.  Neurological: He is alert and oriented to person, place, and time.  Skin: Skin is warm and dry.  Psychiatric: He has a normal mood and affect. His behavior is normal.  Vitals reviewed.       Assessment & Plan:   Encounter Diagnoses  Name Primary?  .Marland Kitchen  Essential hypertension, benign Yes  . Other chronic pain   . Cigarette nicotine dependence without complication     -discussed with pt that gabapentin and lisinopril were likely not the cause of his abdominal problems.   -rx amlodipine 5mg  daily to replace the metoprolol which he wants to stop because it is interfering with erections -counseled smoking  cessation. Also discussed negative health impacts from smoking including that it can worsen ED -follow up 6 weeks to recheck BP.  RTO sooner prn

## 2016-06-14 NOTE — Patient Instructions (Signed)

## 2016-08-04 ENCOUNTER — Ambulatory Visit: Payer: Self-pay | Admitting: Physician Assistant

## 2016-08-11 ENCOUNTER — Ambulatory Visit: Payer: Self-pay | Admitting: Physician Assistant

## 2016-08-11 ENCOUNTER — Encounter: Payer: Self-pay | Admitting: Physician Assistant

## 2016-08-11 VITALS — BP 110/82 | HR 66 | Temp 97.0°F | Ht 72.0 in | Wt 162.0 lb

## 2016-08-11 DIAGNOSIS — I10 Essential (primary) hypertension: Secondary | ICD-10-CM

## 2016-08-11 DIAGNOSIS — F17210 Nicotine dependence, cigarettes, uncomplicated: Secondary | ICD-10-CM

## 2016-08-11 DIAGNOSIS — G8929 Other chronic pain: Secondary | ICD-10-CM

## 2016-08-11 LAB — BASIC METABOLIC PANEL
BUN: 10 mg/dL (ref 7–25)
CALCIUM: 9.2 mg/dL (ref 8.6–10.3)
CHLORIDE: 107 mmol/L (ref 98–110)
CO2: 21 mmol/L (ref 20–31)
Creat: 0.96 mg/dL (ref 0.70–1.33)
GLUCOSE: 102 mg/dL — AB (ref 65–99)
Potassium: 4.2 mmol/L (ref 3.5–5.3)
SODIUM: 138 mmol/L (ref 135–146)

## 2016-08-11 NOTE — Progress Notes (Signed)
BP 110/82 (BP Location: Left Arm, Patient Position: Sitting, Cuff Size: Normal)   Pulse 66   Temp 97 F (36.1 C) (Other (Comment))   Ht 6' (1.829 m)   Wt 162 lb (73.5 kg)   SpO2 95%   BMI 21.97 kg/m    Subjective:    Patient ID: Michael Rivas, male    DOB: Mar 30, 1956, 61 y.o.   MRN: [id number]  HPI: Michael Rivas is a 61 y.o. male presenting on 08/11/2016 for Hypertension   HPI   Pt states no more abdominal pain.  He is still smoking  Relevant past medical, surgical, family and social history reviewed and updated as indicated. Interim medical history since our last visit reviewed. Allergies and medications reviewed and updated.   Current Outpatient Prescriptions:  .  amLODipine (NORVASC) 5 MG tablet, Take 1 tablet (5 mg total) by mouth daily., Disp: 90 tablet, Rfl: 1 .  aspirin EC 81 MG tablet, Take 81 mg by mouth daily., Disp: , Rfl:    Review of Systems  Constitutional: Negative for appetite change, chills, diaphoresis, fatigue, fever and unexpected weight change.  HENT: Negative for congestion, dental problem, drooling, ear pain, facial swelling, hearing loss, mouth sores, sneezing, sore throat, trouble swallowing and voice change.   Eyes: Negative for pain, discharge, redness, itching and visual disturbance.  Respiratory: Negative for cough, choking, shortness of breath and wheezing.   Cardiovascular: Negative for chest pain, palpitations and leg swelling.  Gastrointestinal: Negative for abdominal pain, blood in stool, constipation, diarrhea and vomiting.  Endocrine: Negative for cold intolerance, heat intolerance and polydipsia.  Genitourinary: Negative for decreased urine volume, dysuria and hematuria.  Musculoskeletal: Positive for back pain and gait problem. Negative for arthralgias.  Skin: Negative for rash.  Allergic/Immunologic: Negative for environmental allergies.  Neurological: Negative for seizures, syncope, light-headedness and headaches.  Hematological:  Negative for adenopathy.  Psychiatric/Behavioral: Positive for dysphoric mood. Negative for agitation and suicidal ideas. The patient is not nervous/anxious.     Per HPI unless specifically indicated above     Objective:    BP 110/82 (BP Location: Left Arm, Patient Position: Sitting, Cuff Size: Normal)   Pulse 66   Temp 97 F (36.1 C) (Other (Comment))   Ht 6' (1.829 m)   Wt 162 lb (73.5 kg)   SpO2 95%   BMI 21.97 kg/m   Wt Readings from Last 3 Encounters:  08/11/16 162 lb (73.5 kg)  06/14/16 157 lb (71.2 kg)  05/13/16 158 lb (71.7 kg)    Physical Exam  Constitutional: He is oriented to person, place, and time. He appears well-developed and well-nourished.  HENT:  Head: Normocephalic and atraumatic.  Neck: Neck supple.  Cardiovascular: Normal rate and regular rhythm.   Pulmonary/Chest: Effort normal and breath sounds normal. He has no wheezes.  Abdominal: Soft. Bowel sounds are normal. There is no hepatosplenomegaly. There is no tenderness.  Musculoskeletal: He exhibits no edema.  Lymphadenopathy:    He has no cervical adenopathy.  Neurological: He is alert and oriented to person, place, and time.  Skin: Skin is warm and dry.  Psychiatric: He has a normal mood and affect. His behavior is normal.  Vitals reviewed.       Assessment & Plan:    Encounter Diagnoses  Name Primary?  . Essential hypertension, benign Yes  . Cigarette nicotine dependence without complication   . Other chronic pain     -Check bmp today -continue current medicaitons -counseled smoking cessation -follow up 3 months.  RTO sooner prn

## 2016-11-10 ENCOUNTER — Encounter: Payer: Self-pay | Admitting: Physician Assistant

## 2016-11-10 ENCOUNTER — Ambulatory Visit: Payer: Self-pay | Admitting: Physician Assistant

## 2016-11-10 VITALS — BP 134/76 | HR 70 | Temp 97.9°F | Ht 72.0 in | Wt 163.0 lb

## 2016-11-10 DIAGNOSIS — Z9889 Other specified postprocedural states: Secondary | ICD-10-CM

## 2016-11-10 DIAGNOSIS — M545 Low back pain: Secondary | ICD-10-CM

## 2016-11-10 NOTE — Progress Notes (Signed)
BP 134/76 (BP Location: Left Arm, Patient Position: Sitting, Cuff Size: Normal)   Pulse 70   Temp 97.9 F (36.6 C) (Other (Comment))   Ht 6' (1.829 m)   Wt 163 lb (73.9 kg)   SpO2 98%   BMI 22.11 kg/m    Subjective:    Patient ID: Michael Rivas, male    DOB: [birth year], 61 y.o.   MRN: [id number]  HPI: Michael Rivas is a 61 y.o. male presenting on 11/10/2016 for Hypertension   HPI  Pt c/o back pain for 2 weeks.  No self treatment  He believes this to be different than his usual pain because it radiates down into his testicles.  No problems urinating.   He says he doesn't remember picking up anything heavy.   Pt had back surgery by Dr Aliene Beams in [place] several years ago.  Those records are not available. MRI 2016 abnormal.    Relevant past medical, surgical, family and social history reviewed and updated as indicated. Interim medical history since our last visit reviewed. Allergies and medications reviewed and updated.   Current Outpatient Prescriptions:  .  amLODipine (NORVASC) 5 MG tablet, Take 1 tablet (5 mg total) by mouth daily., Disp: 90 tablet, Rfl: 1 .  aspirin EC 81 MG tablet, Take 81 mg by mouth daily., Disp: , Rfl:    Review of Systems  Constitutional: Negative for appetite change, chills, diaphoresis, fatigue, fever and unexpected weight change.  HENT: Negative for congestion, dental problem, drooling, ear pain, facial swelling, hearing loss, mouth sores, sneezing, sore throat, trouble swallowing and voice change.   Eyes: Negative for pain, discharge, redness, itching and visual disturbance.  Respiratory: Negative for cough, choking, shortness of breath and wheezing.   Cardiovascular: Negative for chest pain, palpitations and leg swelling.  Gastrointestinal: Negative for abdominal pain, blood in stool, constipation, diarrhea and vomiting.  Endocrine: Negative for cold intolerance, heat intolerance and polydipsia.  Genitourinary: Negative for decreased  urine volume, dysuria and hematuria.  Musculoskeletal: Positive for back pain and gait problem. Negative for arthralgias.  Skin: Negative for rash.  Allergic/Immunologic: Negative for environmental allergies.  Neurological: Negative for seizures, syncope, light-headedness and headaches.  Hematological: Negative for adenopathy.  Psychiatric/Behavioral: Positive for dysphoric mood. Negative for agitation and suicidal ideas. The patient is not nervous/anxious.     Per HPI unless specifically indicated above     Objective:    BP 134/76 (BP Location: Left Arm, Patient Position: Sitting, Cuff Size: Normal)   Pulse 70   Temp 97.9 F (36.6 C) (Other (Comment))   Ht 6' (1.829 m)   Wt 163 lb (73.9 kg)   SpO2 98%   BMI 22.11 kg/m   Wt Readings from Last 3 Encounters:  11/10/16 163 lb (73.9 kg)  08/11/16 162 lb (73.5 kg)  06/14/16 157 lb (71.2 kg)    Physical Exam  Constitutional: He is oriented to person, place, and time. He appears well-developed and well-nourished.  HENT:  Head: Normocephalic and atraumatic.  Neck: Neck supple.  Cardiovascular: Normal rate and regular rhythm.   Pulmonary/Chest: Effort normal and breath sounds normal. He has no wheezes.  Abdominal: Soft. Bowel sounds are normal. There is no hepatosplenomegaly. There is no tenderness. Hernia confirmed negative in the right inguinal area and confirmed negative in the left inguinal area.  Musculoskeletal: He exhibits no edema.       Lumbar back: He exhibits decreased range of motion, tenderness and pain. He exhibits no swelling and no edema.  Well-healed surgical scar lumbar back  Lymphadenopathy:    He has no cervical adenopathy.  Neurological: He is alert and oriented to person, place, and time.  Reflex Scores:      Patellar reflexes are 3+ on the right side and 3+ on the left side. Skin: Skin is warm and dry.  Psychiatric: He has a normal mood and affect. His behavior is normal.  Vitals reviewed.        Assessment & Plan:   Encounter Diagnoses  Name Primary?  . Low back pain, unspecified back pain laterality, unspecified chronicity, with sciatica presence unspecified Yes  . History of lumbar surgery      -Order xray back and refer to ortho -ibu or naprosyn for pain.  Heat (15-20 minutes at a time) to back -pt was given [facility] discount application -f/u 1 month.  RTO sooner prn worsening or new symptoms

## 2016-11-10 NOTE — Patient Instructions (Signed)
Get xray done Turn in [facility] discount application

## 2016-11-11 ENCOUNTER — Ambulatory Visit (HOSPITAL_COMMUNITY)
Admission: RE | Admit: 2016-11-11 | Discharge: 2016-11-11 | Disposition: A | Discharge status: Home / Self Care | Payer: Self-pay | Source: Ambulatory Visit | Attending: Physician Assistant | Admitting: Physician Assistant

## 2016-11-11 DIAGNOSIS — M545 Low back pain: Secondary | ICD-10-CM | POA: Insufficient documentation

## 2016-11-11 DIAGNOSIS — M5136 Other intervertebral disc degeneration, lumbar region: Secondary | ICD-10-CM | POA: Insufficient documentation

## 2016-12-08 ENCOUNTER — Ambulatory Visit: Payer: Self-pay | Admitting: Physician Assistant

## 2016-12-08 ENCOUNTER — Encounter: Payer: Self-pay | Admitting: Physician Assistant

## 2016-12-08 VITALS — BP 116/76 | HR 83 | Temp 97.7°F | Ht 72.0 in | Wt 158.5 lb

## 2016-12-08 DIAGNOSIS — Z9889 Other specified postprocedural states: Secondary | ICD-10-CM

## 2016-12-08 DIAGNOSIS — I10 Essential (primary) hypertension: Secondary | ICD-10-CM

## 2016-12-08 DIAGNOSIS — M545 Low back pain: Secondary | ICD-10-CM

## 2016-12-08 NOTE — Patient Instructions (Signed)
[facility] customer service- [phone]

## 2016-12-08 NOTE — Progress Notes (Signed)
BP 116/76 (BP Location: Left Arm, Patient Position: Sitting, Cuff Size: Normal)   Pulse 83   Temp 97.7 F (36.5 C)   Ht 6' (1.829 m)   Wt 158 lb 8 oz (71.9 kg)   SpO2 99%   BMI 21.50 kg/m    Subjective:    Patient ID: Michael Rivas, male    DOB: 1956-01-16, 61 y.o.   MRN: [id number]  HPI: Michael Rivas is a 61 y.o. male presenting on 12/08/2016 for Back Pain (pt states he is taking cymbalta for pain. pt states not helpful)   HPI   Pt says he turned in his [facility] discount application.  Pt thinks his pain is about the same as when he was here a month ago.   Relevant past medical, surgical, family and social history reviewed and updated as indicated. Interim medical history since our last visit reviewed. Allergies and medications reviewed and updated.   Current Outpatient Prescriptions:  .  amLODipine (NORVASC) 5 MG tablet, Take 1 tablet (5 mg total) by mouth daily., Disp: 90 tablet, Rfl: 1 .  aspirin EC 81 MG tablet, Take 81 mg by mouth daily., Disp: , Rfl:  .  DULoxetine (CYMBALTA) 30 MG capsule, Take 30 mg by mouth daily., Disp: , Rfl:    Review of Systems  Constitutional: Negative for appetite change, chills, diaphoresis, fatigue, fever and unexpected weight change.  HENT: Negative for congestion, dental problem, drooling, ear pain, facial swelling, hearing loss, mouth sores, sneezing, sore throat, trouble swallowing and voice change.   Eyes: Negative for pain, discharge, redness, itching and visual disturbance.  Respiratory: Negative for cough, choking, shortness of breath and wheezing.   Cardiovascular: Negative for chest pain, palpitations and leg swelling.  Gastrointestinal: Negative for abdominal pain, blood in stool, constipation, diarrhea and vomiting.  Endocrine: Negative for cold intolerance, heat intolerance and polydipsia.  Genitourinary: Negative for decreased urine volume, dysuria and hematuria.  Musculoskeletal: Positive for back pain. Negative for arthralgias  and gait problem.  Skin: Negative for rash.  Allergic/Immunologic: Negative for environmental allergies.  Neurological: Negative for seizures, syncope, light-headedness and headaches.  Hematological: Negative for adenopathy.  Psychiatric/Behavioral: Negative for agitation, dysphoric mood and suicidal ideas. The patient is not nervous/anxious.     Per HPI unless specifically indicated above     Objective:    BP 116/76 (BP Location: Left Arm, Patient Position: Sitting, Cuff Size: Normal)   Pulse 83   Temp 97.7 F (36.5 C)   Ht 6' (1.829 m)   Wt 158 lb 8 oz (71.9 kg)   SpO2 99%   BMI 21.50 kg/m   Wt Readings from Last 3 Encounters:  12/08/16 158 lb 8 oz (71.9 kg)  11/10/16 163 lb (73.9 kg)  08/11/16 162 lb (73.5 kg)    Physical Exam  Constitutional: He is oriented to person, place, and time. He appears well-developed and well-nourished.  HENT:  Head: Normocephalic and atraumatic.  Neck: Neck supple.  Cardiovascular: Normal rate and regular rhythm.   Pulmonary/Chest: Effort normal and breath sounds normal. He has no wheezes.  Abdominal: Soft. Bowel sounds are normal. There is no hepatosplenomegaly. There is no tenderness.  Musculoskeletal: He exhibits no edema.       Lumbar back: He exhibits tenderness. He exhibits no bony tenderness, no swelling and no deformity.  Lymphadenopathy:    He has no cervical adenopathy.  Neurological: He is alert and oriented to person, place, and time.  Skin: Skin is warm and dry.  Psychiatric: He  has a normal mood and affect. His behavior is normal.  Vitals reviewed.       Assessment & Plan:   Encounter Diagnoses  Name Primary?  . Low back pain, unspecified back pain laterality, unspecified chronicity, with sciatica presence unspecified Yes  . History of lumbar surgery   . Essential hypertension, benign     -reviewed back xray with pt -nurse checked on referral.  Pt told to let us know if he hasn't gotten a call from orthopedist in a  week -pt is given phone number for [facility] customer service if he needs to check on his discount application -pt to follow up for htn in 4 months. RTO sooner prn

## 2017-01-17 ENCOUNTER — Other Ambulatory Visit: Payer: Self-pay | Admitting: Physician Assistant

## 2017-04-06 ENCOUNTER — Encounter (HOSPITAL_COMMUNITY): Payer: Self-pay | Admitting: Emergency Medicine

## 2017-04-06 ENCOUNTER — Emergency Department (HOSPITAL_COMMUNITY)
Admission: EM | Admit: 2017-04-06 | Discharge: 2017-04-06 | Disposition: A | Discharge status: Home / Self Care | Payer: Medicaid Other | Attending: Emergency Medicine | Admitting: Emergency Medicine

## 2017-04-06 DIAGNOSIS — F17210 Nicotine dependence, cigarettes, uncomplicated: Secondary | ICD-10-CM | POA: Insufficient documentation

## 2017-04-06 DIAGNOSIS — Z7982 Long term (current) use of aspirin: Secondary | ICD-10-CM | POA: Insufficient documentation

## 2017-04-06 DIAGNOSIS — Z79899 Other long term (current) drug therapy: Secondary | ICD-10-CM | POA: Insufficient documentation

## 2017-04-06 DIAGNOSIS — F17290 Nicotine dependence, other tobacco product, uncomplicated: Secondary | ICD-10-CM | POA: Diagnosis not present

## 2017-04-06 DIAGNOSIS — X500XXA Overexertion from strenuous movement or load, initial encounter: Secondary | ICD-10-CM | POA: Diagnosis not present

## 2017-04-06 DIAGNOSIS — Y999 Unspecified external cause status: Secondary | ICD-10-CM | POA: Insufficient documentation

## 2017-04-06 DIAGNOSIS — S39012A Strain of muscle, fascia and tendon of lower back, initial encounter: Secondary | ICD-10-CM | POA: Diagnosis not present

## 2017-04-06 DIAGNOSIS — I10 Essential (primary) hypertension: Secondary | ICD-10-CM | POA: Diagnosis not present

## 2017-04-06 DIAGNOSIS — Y929 Unspecified place or not applicable: Secondary | ICD-10-CM | POA: Insufficient documentation

## 2017-04-06 DIAGNOSIS — S3992XA Unspecified injury of lower back, initial encounter: Secondary | ICD-10-CM | POA: Diagnosis present

## 2017-04-06 DIAGNOSIS — Y9389 Activity, other specified: Secondary | ICD-10-CM | POA: Diagnosis not present

## 2017-04-06 MED ORDER — PREDNISONE 10 MG PO TABS
60.0000 mg | ORAL_TABLET | Freq: Once | ORAL | Status: AC
  Administered 2017-04-06: 60 mg via ORAL
  Filled 2017-04-06: qty 1

## 2017-04-06 MED ORDER — OXYCODONE-ACETAMINOPHEN 5-325 MG PO TABS: 1.0000 | ORAL_TABLET | ORAL | 0 refills | Status: DC | PRN

## 2017-04-06 MED ORDER — PREDNISONE 10 MG PO TABS: ORAL_TABLET | ORAL | 0 refills | Status: DC

## 2017-04-06 MED ORDER — HYDROMORPHONE HCL 1 MG/ML IJ SOLN
1.0000 mg | Freq: Once | INTRAMUSCULAR | Status: AC
  Administered 2017-04-06: 1 mg via INTRAMUSCULAR
  Filled 2017-04-06: qty 1

## 2017-04-06 NOTE — ED Triage Notes (Signed)
Pt reports was changing a tire yesterday and reports lower back pain ever since.pt reports pain radiating to left groin. Pt reports took ibuprofen at home with no relief. nad noted.

## 2017-04-06 NOTE — ED Provider Notes (Signed)
[facility]-EM[facility]ENCY DEPT Provider Note   CSN: [id number] Arrival date & time: 04/06/17  0720     History   Chief Complaint Chief Complaint  Patient presents with  . Back Pain    HPI Michael Rivas is a 61 y.o. male presenting with acute on chronic low back pain which has which has been present since changing a tire yesterday.  He describes feeling a popping sensation and had pain with radiation to his left mid thigh since the event.  There has been weakness with movement and trying to walk but denies numbness in the lower extremities and no urinary or bowel retention or incontinence.  Patient does not have a history of cancer or IVDU.  The patient has tried rest without significant relief of symptoms. .  The history is provided by the patient.    Past Medical History:  Diagnosis Date  . Hypertension 2012    Patient Active Problem List   Diagnosis Date Noted  . Essential hypertension, benign 03/02/2016  . Other tobacco product nicotine dependence with nicotine-induced disorder 03/02/2016  . Depression 03/02/2016  . Chronic pain 03/02/2016    Past Surgical History:  Procedure Laterality Date  . BACK SURGERY  2016  . CHOLECYSTECTOMY  2007       Home Medications    Prior to Admission medications   Medication Sig Start Date End Date Taking? Authorizing Provider  amLODipine (NORVASC) 5 MG tablet TAKE 1 Tablet BY MOUTH ONCE DAILY 01/17/17   Jacquelin Hawking, PA-C  aspirin EC 81 MG tablet Take 81 mg by mouth daily.    [provider]  DULoxetine (CYMBALTA) 30 MG capsule Take 30 mg by mouth daily.    [provider]  oxyCODONE-acetaminophen (PERCOCET/ROXICET) 5-325 MG tablet Take 1 tablet by mouth every 4 (four) hours as needed. 04/06/17   Burgess Amor, PA-C  predniSONE (DELTASONE) 10 MG tablet Take 6 tablets day one, 5 tablets day two, 4 tablets day three, 3 tablets day four, 2 tablets day five, then 1 tablet day six 04/07/17   Burgess Amor, PA-C    Family  History Family History  Problem Relation Age of Onset  . Stroke Mother   . Diabetes Mother   . Hypertension Mother   . Heart failure Father   . Heart attack Father   . Diabetes Maternal Aunt   . Heart attack Maternal Aunt   . Stroke Maternal Uncle   . Stroke Paternal Uncle   . Stroke Maternal Uncle   . Heart attack Paternal Uncle     Social History Social History  Substance Use Topics  . Smoking status: Current Every Day Smoker    Packs/day: 1.00    Years: 45.00    Types: Cigars, Cigarettes  . Smokeless tobacco: Never Used     Comment: smokes 2-3 cigars/daily.  former cigarette smoker , quit 2016, 0.5 ppd  . Alcohol use 3.6 oz/week    6 Cans of beer per week     Comment: weekend drinker- some days drinks 6/day     Allergies   Patient has no known allergies.   Review of Systems Review of Systems  Constitutional: Negative for fever.  Respiratory: Negative for shortness of breath.   Cardiovascular: Negative for chest pain and leg swelling.  Gastrointestinal: Negative for abdominal distention, abdominal pain and constipation.  Genitourinary: Negative for difficulty urinating, dysuria, flank pain, frequency and urgency.  Musculoskeletal: Positive for back pain. Negative for gait problem and joint swelling.  Skin: Negative for rash.  Neurological: Negative for weakness and numbness.     Physical Exam Updated Vital Signs BP 136/78 (BP Location: Left Arm)   Pulse 67   Temp 97.8 F (36.6 C) (Oral)   Resp 18   Ht 6' (1.829 m)   Wt 72.6 kg (160 lb)   SpO2 96%   BMI 21.70 kg/m   Physical Exam  Constitutional: He appears well-developed and well-nourished.  HENT:  Head: Normocephalic.  Eyes: Conjunctivae are normal.  Neck: Normal range of motion. Neck supple.  Cardiovascular: Normal rate and intact distal pulses.   Pedal pulses normal.  Pulmonary/Chest: Effort normal.  Abdominal: Soft. Bowel sounds are normal. He exhibits no distension and no mass.   Musculoskeletal: Normal range of motion. He exhibits no edema.       Lumbar back: He exhibits tenderness. He exhibits no swelling, no edema and no spasm.  Neurological: He is alert. He has normal strength. He displays no atrophy and no tremor. No sensory deficit.  Reflex Scores:      Patellar reflexes are 2+ on the right side and 2+ on the left side. No strength deficit noted in hip and knee flexor and extensor muscle groups.  Ankle flexion and extension intact. Ambulatory, walks with trunk in forward flexion.  No foot drop.  Skin: Skin is warm and dry.  Psychiatric: He has a normal mood and affect.  Nursing note and vitals reviewed.    ED Treatments / Results  Labs (all labs ordered are listed, but only abnormal results are displayed) Labs Reviewed - No data to display  EKG  EKG Interpretation None       Radiology No results found.  Procedures Procedures (including critical care time)  Medications Ordered in ED Medications  HYDROmorphone (DILAUDID) injection 1 mg (1 mg Intramuscular Given 04/06/17 0832)  predniSONE (DELTASONE) tablet 60 mg (60 mg Oral Given 04/06/17 1610)     Initial Impression / Assessment and Plan / ED Course  I have reviewed the triage vital signs and the nursing notes.  Pertinent labs & imaging results that were available during my care of the patient were reviewed by me and considered in my medical decision making (see chart for details).     No neuro deficit on exam or by history to suggest emergent or surgical presentation.  Also discussed worsened sx that should prompt immediate re-evaluation including distal weakness, bowel/bladder retention/incontinence.        Final Clinical Impressions(s) / ED Diagnoses   Final diagnoses:  Strain of lumbar region, initial encounter    New Prescriptions New Prescriptions   OXYCODONE-ACETAMINOPHEN (PERCOCET/ROXICET) 5-325 MG TABLET    Take 1 tablet by mouth every 4 (four) hours as needed.    PREDNISONE (DELTASONE) 10 MG TABLET    Take 6 tablets day one, 5 tablets day two, 4 tablets day three, 3 tablets day four, 2 tablets day five, then 1 tablet day six     Burgess Amor, PA-C 04/06/17 9604    Bethann Berkshire, MD 04/07/17 [phone]

## 2017-04-06 NOTE — Discharge Instructions (Signed)
Take your next dose of prednisone tomorrow morning.  Use the the other medicines as directed.  Do not drive within 4 hours of taking oxycodone as this will make you drowsy.  Avoid lifting,  Bending,  Twisting or any other activity that worsens your pain over the next week.  Apply a heating pad  to your lower back for 20 minutes 3 times daily for the next 2 days.  You should get rechecked if your symptoms are not better over the next 5 days,  Or you develop increased pain,  Weakness in your leg(s) or loss of bladder or bowel function - these can be symptoms of a worsening condition.

## 2017-04-06 NOTE — ED Notes (Signed)
Patient given discharge instruction, verbalized understand. Patient ambulatory out of the department.  

## 2017-04-11 ENCOUNTER — Ambulatory Visit: Payer: Self-pay | Admitting: Physician Assistant

## 2017-04-18 ENCOUNTER — Encounter: Payer: Self-pay | Admitting: Physician Assistant

## 2017-06-15 ENCOUNTER — Other Ambulatory Visit: Payer: Self-pay | Admitting: Physician Assistant

## 2017-06-15 MED ORDER — AMLODIPINE BESYLATE 5 MG PO TABS: 5.0000 mg | ORAL_TABLET | Freq: Every day | ORAL | 0 refills | Status: DC

## 2017-07-04 ENCOUNTER — Ambulatory Visit: Payer: Self-pay | Admitting: Physician Assistant

## 2017-07-04 ENCOUNTER — Encounter: Payer: Self-pay | Admitting: Physician Assistant

## 2017-07-04 VITALS — BP 138/86 | HR 83 | Temp 97.9°F | Ht 72.0 in | Wt 152.0 lb

## 2017-07-04 DIAGNOSIS — M545 Low back pain: Secondary | ICD-10-CM

## 2017-07-04 DIAGNOSIS — I10 Essential (primary) hypertension: Secondary | ICD-10-CM

## 2017-07-04 DIAGNOSIS — Z9889 Other specified postprocedural states: Secondary | ICD-10-CM

## 2017-07-04 DIAGNOSIS — Z125 Encounter for screening for malignant neoplasm of prostate: Secondary | ICD-10-CM

## 2017-07-04 DIAGNOSIS — M5136 Other intervertebral disc degeneration, lumbar region: Secondary | ICD-10-CM

## 2017-07-04 DIAGNOSIS — M48061 Spinal stenosis, lumbar region without neurogenic claudication: Secondary | ICD-10-CM

## 2017-07-04 DIAGNOSIS — G8929 Other chronic pain: Secondary | ICD-10-CM

## 2017-07-04 DIAGNOSIS — Z1211 Encounter for screening for malignant neoplasm of colon: Secondary | ICD-10-CM

## 2017-07-04 DIAGNOSIS — H6122 Impacted cerumen, left ear: Secondary | ICD-10-CM

## 2017-07-04 MED ORDER — AMLODIPINE BESYLATE 5 MG PO TABS: 5.0000 mg | ORAL_TABLET | Freq: Every day | ORAL | 3 refills | Status: DC

## 2017-07-04 NOTE — Progress Notes (Signed)
BP 138/86 (BP Location: Left Arm, Patient Position: Sitting, Cuff Size: Normal)   Pulse 83   Temp 97.9 F (36.6 C)   Ht 6' (1.829 m)   Wt 152 lb (68.9 kg)   SpO2 99%   BMI 20.61 kg/m    Subjective:    Patient ID: Michael Rivas, male    DOB: 10-28-1955, 61 y.o.   MRN: [id number]  HPI: Michael Rivas is a 61 y.o. male presenting on 07/04/2017 for Hypertension   HPI   Pt has not been to specialist as referred.  He never contacted orthopedist back after they called him, left voicemail and sent him a letter. Information from referral shows that orthopedics thinks pt should be referred to neurosurgeon due to histroy of back surgery.   He says his back is doing a little better, as long as he doesn't lift anything.  He does say however that he hurts constantly.    Relevant past medical, surgical, family and social history reviewed and updated as indicated. Interim medical history since our last visit reviewed. Allergies and medications reviewed and updated.   Current Outpatient Medications:  .  amLODipine (NORVASC) 5 MG tablet, Take 1 tablet (5 mg total) daily by mouth., Disp: 30 tablet, Rfl: 0 .  aspirin EC 81 MG tablet, Take 81 mg by mouth daily., Disp: , Rfl:    Review of Systems  Constitutional: Negative for appetite change, chills, diaphoresis, fatigue, fever and unexpected weight change.  HENT: Positive for ear pain. Negative for congestion, dental problem, drooling, facial swelling, hearing loss, mouth sores, sneezing, sore throat, trouble swallowing and voice change.   Eyes: Negative for pain, discharge, redness, itching and visual disturbance.  Respiratory: Negative for cough, choking, shortness of breath and wheezing.   Cardiovascular: Negative for chest pain, palpitations and leg swelling.  Gastrointestinal: Negative for abdominal pain, blood in stool, constipation, diarrhea and vomiting.  Endocrine: Negative for cold intolerance, heat intolerance and polydipsia.   Genitourinary: Negative for decreased urine volume, dysuria and hematuria.  Musculoskeletal: Positive for arthralgias, back pain and gait problem.  Skin: Negative for rash.  Allergic/Immunologic: Negative for environmental allergies.  Neurological: Negative for seizures, syncope, light-headedness and headaches.  Hematological: Negative for adenopathy.  Psychiatric/Behavioral: Negative for agitation, dysphoric mood and suicidal ideas. The patient is not nervous/anxious.     Per HPI unless specifically indicated above     Objective:    BP 138/86 (BP Location: Left Arm, Patient Position: Sitting, Cuff Size: Normal)   Pulse 83   Temp 97.9 F (36.6 C)   Ht 6' (1.829 m)   Wt 152 lb (68.9 kg)   SpO2 99%   BMI 20.61 kg/m   Wt Readings from Last 3 Encounters:  07/04/17 152 lb (68.9 kg)  04/06/17 160 lb (72.6 kg)  12/08/16 158 lb 8 oz (71.9 kg)    Physical Exam  Constitutional: He is oriented to person, place, and time. He appears well-developed and well-nourished.  HENT:  Head: Normocephalic and atraumatic.  Right Ear: No swelling or tenderness. A foreign body is present.  Left Ear: No swelling or tenderness. No foreign bodies.  Cerumen R ear.  After lavage, TM normal and pt reports improvement  Neck: Neck supple.  Cardiovascular: Normal rate and regular rhythm.  Pulmonary/Chest: Effort normal and breath sounds normal. He has no wheezes.  Abdominal: Soft. Bowel sounds are normal. There is no hepatosplenomegaly. There is no tenderness.  Musculoskeletal: He exhibits no edema.  Lymphadenopathy:    He has  no cervical adenopathy.  Neurological: He is alert and oriented to person, place, and time.  Skin: Skin is warm and dry.  Psychiatric: He has a normal mood and affect. His behavior is normal.  Vitals reviewed.       Assessment & Plan:   Encounter Diagnoses  Name Primary?  . Essential hypertension, benign Yes  . Screening for prostate cancer   . Low back pain, unspecified  back pain laterality, unspecified chronicity, with sciatica presence unspecified   . History of lumbar surgery   . Impacted cerumen, left ear   . Chronic low back pain, unspecified back pain laterality, with sciatica presence unspecified   . Degenerative disc disease, lumbar   . Spinal stenosis of lumbar region, unspecified whether neurogenic claudication present   . Special screening for malignant neoplasms, colon       -refer neurosurgery.  Discussed with pt that wait can be long for an appointment -pt was given iFOBT for colon cancer screening -Check labs- psa, bmp -pt to continue current medication -pt to follow up 3 months.  RTO sooner prn

## 2017-07-05 ENCOUNTER — Other Ambulatory Visit (HOSPITAL_COMMUNITY)
Admission: RE | Admit: 2017-07-05 | Discharge: 2017-07-05 | Disposition: A | Discharge status: Home / Self Care | Payer: Self-pay | Source: Ambulatory Visit | Attending: Orthopedic Surgery | Admitting: Orthopedic Surgery

## 2017-07-05 ENCOUNTER — Other Ambulatory Visit: Payer: Self-pay | Admitting: Physician Assistant

## 2017-07-05 DIAGNOSIS — Z1211 Encounter for screening for malignant neoplasm of colon: Secondary | ICD-10-CM

## 2017-07-05 DIAGNOSIS — Z125 Encounter for screening for malignant neoplasm of prostate: Secondary | ICD-10-CM | POA: Insufficient documentation

## 2017-07-05 DIAGNOSIS — I10 Essential (primary) hypertension: Secondary | ICD-10-CM | POA: Insufficient documentation

## 2017-07-05 LAB — BASIC METABOLIC PANEL
ANION GAP: 7 (ref 5–15)
BUN: 9 mg/dL (ref 6–20)
CHLORIDE: 102 mmol/L (ref 101–111)
CO2: 24 mmol/L (ref 22–32)
Calcium: 9.6 mg/dL (ref 8.9–10.3)
Creatinine, Ser: 0.95 mg/dL (ref 0.61–1.24)
GFR calc Af Amer: >60 mL/min (ref >60)
GFR calc non Af Amer: >60 mL/min (ref >60)
GLUCOSE: 137 mg/dL — AB (ref 65–99)
Potassium: 4.2 mmol/L (ref 3.5–5.1)
Sodium: 133 mmol/L — ABNORMAL LOW (ref 135–145)

## 2017-07-05 LAB — PSA: Prostatic Specific Antigen: 2.74 ng/mL (ref 0.00–4.00)

## 2017-07-05 LAB — IFOBT (OCCULT BLOOD): IFOBT: NEGATIVE

## 2017-08-16 ENCOUNTER — Emergency Department (HOSPITAL_COMMUNITY): Payer: Medicare Other

## 2017-08-16 ENCOUNTER — Other Ambulatory Visit: Payer: Self-pay

## 2017-08-16 ENCOUNTER — Encounter (HOSPITAL_COMMUNITY): Payer: Self-pay

## 2017-08-16 ENCOUNTER — Emergency Department (HOSPITAL_COMMUNITY)
Admission: EM | Admit: 2017-08-16 | Discharge: 2017-08-16 | Disposition: A | Discharge status: Home / Self Care | Payer: Medicare Other | Attending: Emergency Medicine | Admitting: Emergency Medicine

## 2017-08-16 DIAGNOSIS — Z79899 Other long term (current) drug therapy: Secondary | ICD-10-CM | POA: Diagnosis not present

## 2017-08-16 DIAGNOSIS — J069 Acute upper respiratory infection, unspecified: Secondary | ICD-10-CM | POA: Insufficient documentation

## 2017-08-16 DIAGNOSIS — R0789 Other chest pain: Secondary | ICD-10-CM | POA: Diagnosis not present

## 2017-08-16 DIAGNOSIS — I10 Essential (primary) hypertension: Secondary | ICD-10-CM | POA: Diagnosis not present

## 2017-08-16 DIAGNOSIS — R0602 Shortness of breath: Secondary | ICD-10-CM | POA: Diagnosis not present

## 2017-08-16 DIAGNOSIS — F17210 Nicotine dependence, cigarettes, uncomplicated: Secondary | ICD-10-CM | POA: Diagnosis not present

## 2017-08-16 DIAGNOSIS — R05 Cough: Secondary | ICD-10-CM | POA: Diagnosis not present

## 2017-08-16 DIAGNOSIS — Z7982 Long term (current) use of aspirin: Secondary | ICD-10-CM | POA: Diagnosis not present

## 2017-08-16 DIAGNOSIS — R079 Chest pain, unspecified: Secondary | ICD-10-CM | POA: Diagnosis not present

## 2017-08-16 MED ORDER — PREDNISONE 20 MG PO TABS: 40.0000 mg | ORAL_TABLET | Freq: Every day | ORAL | 0 refills | Status: DC

## 2017-08-16 MED ORDER — AZITHROMYCIN 250 MG PO TABS: 250.0000 mg | ORAL_TABLET | Freq: Every day | ORAL | 0 refills | Status: DC

## 2017-08-16 MED ORDER — ALBUTEROL SULFATE HFA 108 (90 BASE) MCG/ACT IN AERS
2.0000 | INHALATION_SPRAY | RESPIRATORY_TRACT | Status: DC | PRN
  Administered 2017-08-16: 2 via RESPIRATORY_TRACT
  Filled 2017-08-16: qty 6.7

## 2017-08-16 NOTE — ED Triage Notes (Signed)
Pt reports cough (productive) that started 3 weeks ago, with SOB and chest tightness with breathing and coughing that started yesterday.

## 2017-08-16 NOTE — ED Provider Notes (Signed)
[facility] EMERGENCY DEPARTMENT Provider Note   CSN: [id number] Arrival date & time: 08/16/17  1843     History   Chief Complaint Chief Complaint  Patient presents with  . Cough    chest pain with coughing and breathing  . Shortness of Breath    HPI Michael Rivas is a 62 y.o. male.  HPI Patient presents with shortness of breath and cough.  Has had for the last 3 weeks.  Has had some yellow to green sputum production.  He is a current everyday smoker.  Has had no swelling in his legs.  States he has had some fevers several days ago.  Some slight chest tightness but no frank pain.  No sick contacts.  No lightheadedness or dizziness.  No previous diagnosis of COPD. Past Medical History:  Diagnosis Date  . Hypertension 2012    Patient Active Problem List   Diagnosis Date Noted  . Essential hypertension, benign 03/02/2016  . Other tobacco product nicotine dependence with nicotine-induced disorder 03/02/2016  . Depression 03/02/2016  . Chronic pain 03/02/2016    Past Surgical History:  Procedure Laterality Date  . BACK SURGERY  2016  . CHOLECYSTECTOMY  2007       Home Medications    Prior to Admission medications   Medication Sig Start Date End Date Taking? Authorizing Provider  amLODipine (NORVASC) 5 MG tablet Take 1 tablet (5 mg total) by mouth daily. 07/04/17  Yes Jacquelin Hawking, PA-C  aspirin EC 81 MG tablet Take 81 mg by mouth daily.   Yes [provider]  azithromycin (ZITHROMAX) 250 MG tablet Take 1 tablet (250 mg total) by mouth daily. Take first 2 tablets together, then 1 every day until finished. 08/16/17   Benjiman Core, MD  predniSONE (DELTASONE) 20 MG tablet Take 2 tablets (40 mg total) by mouth daily. 08/16/17   Benjiman Core, MD    Family History Family History  Problem Relation Age of Onset  . Stroke Mother   . Diabetes Mother   . Hypertension Mother   . Heart failure Father   . Heart attack Father   . Diabetes Maternal Aunt     . Heart attack Maternal Aunt   . Stroke Maternal Uncle   . Stroke Paternal Uncle   . Stroke Maternal Uncle   . Heart attack Paternal Uncle     Social History Social History   Tobacco Use  . Smoking status: Current Every Day Smoker    Packs/day: 1.00    Years: 45.00    Pack years: 45.00    Types: Cigars, Cigarettes  . Smokeless tobacco: Never Used  . Tobacco comment: smokes 2-3 cigars/daily.  former cigarette smoker , quit 2016, 0.5 ppd  Substance Use Topics  . Alcohol use: Yes    Alcohol/week: 3.6 oz    Types: 6 Cans of beer per week    Comment: weekend drinker- some days drinks 6/day  . Drug use: No     Allergies   Patient has no known allergies.   Review of Systems Review of Systems  Constitutional: Negative for appetite change and fever.  HENT: Positive for congestion.   Respiratory: Positive for cough, chest tightness and shortness of breath.   Cardiovascular: Negative for leg swelling.  Gastrointestinal: Negative for abdominal pain.  Genitourinary: Negative for flank pain.  Musculoskeletal: Negative for back pain.  Neurological: Negative for weakness and light-headedness.  Hematological: Negative for adenopathy.  Psychiatric/Behavioral: Negative for confusion.     Physical Exam Updated  Vital Signs BP 130/81   Pulse 89   Temp 98 F (36.7 C) (Oral)   Resp 20   Ht 6' (1.829 m)   Wt 74.8 kg (165 lb)   SpO2 96%   BMI 22.38 kg/m   Physical Exam  Constitutional: He appears well-developed.  HENT:  Head: Atraumatic.  Eyes: Pupils are equal, round, and reactive to light.  Neck: Neck supple.  Cardiovascular: Regular rhythm.  Pulmonary/Chest:  Mildly harsh breath sounds without focal rales rhonchi or wheezes.  Abdominal: There is no tenderness.  Musculoskeletal:       Right lower leg: He exhibits no edema.       Left lower leg: He exhibits no edema.  Neurological: He is alert.  Skin: Skin is warm and dry. Capillary refill takes less than 2 seconds.      ED Treatments / Results  Labs (all labs ordered are listed, but only abnormal results are displayed) Labs Reviewed - No data to display  EKG  EKG Interpretation  Date/Time:  Tuesday August 16 2017 19:21:17 EST Ventricular Rate:  84 PR Interval:  138 QRS Duration: 82 QT Interval:  354 QTC Calculation: 418 R Axis:   85 Text Interpretation:  Normal sinus rhythm Biatrial enlargement Anteroseptal infarct , age undetermined Abnormal ECG Confirmed by Benjiman Core [phone]) on 08/16/2017 9:02:55 PM       Radiology Dg Chest 2 View  Result Date: 08/16/2017 CLINICAL DATA:  Productive cough which shortness-of-breath 3 weeks. Mid chest pain. EXAM: CHEST  2 VIEW COMPARISON:  10/14/2013 FINDINGS: Lungs are somewhat hyperexpanded with mild flattening of the hemidiaphragms. There is no focal airspace consolidation or effusion. Cardiomediastinal silhouette is within normal. Remainder of the exam is unchanged. IMPRESSION: No active cardiopulmonary disease. Electronically Signed   By: Elberta Fortis M.D.   On: 08/16/2017 19:41    Procedures Procedures (including critical care time)  Medications Ordered in ED Medications  albuterol (PROVENTIL HFA;VENTOLIN HFA) 108 (90 Base) MCG/ACT inhaler 2 puff (not administered)     Initial Impression / Assessment and Plan / ED Course  I have reviewed the triage vital signs and the nursing notes.  Pertinent labs & imaging results that were available during my care of the patient were reviewed by me and considered in my medical decision making (see chart for details).     Patient with URI symptoms.  X-ray reassuring.  Has had sputum production.  Potentially could have a COPD component with his years of heavy smoking.  Will treat with short course of steroids azithromycin since it has been 3 weeks and inhaler.  Follow-up with PCP as needed.  Doubt this is a cardiac cause.  Final Clinical Impressions(s) / ED Diagnoses   Final diagnoses:  Upper  respiratory tract infection, unspecified type    ED Discharge Orders        Ordered    azithromycin (ZITHROMAX) 250 MG tablet  Daily     08/16/17 2139    predniSONE (DELTASONE) 20 MG tablet  Daily     08/16/17 2139       Benjiman Core, MD 08/16/17 2147

## 2017-10-03 ENCOUNTER — Encounter: Payer: Self-pay | Admitting: Physician Assistant

## 2017-10-03 ENCOUNTER — Ambulatory Visit: Payer: Self-pay | Admitting: Physician Assistant

## 2017-10-03 VITALS — BP 130/76 | HR 82 | Temp 98.4°F | Ht 72.0 in | Wt 163.2 lb

## 2017-10-03 DIAGNOSIS — F17210 Nicotine dependence, cigarettes, uncomplicated: Secondary | ICD-10-CM

## 2017-10-03 DIAGNOSIS — M545 Low back pain: Secondary | ICD-10-CM

## 2017-10-03 DIAGNOSIS — I10 Essential (primary) hypertension: Secondary | ICD-10-CM

## 2017-10-03 NOTE — Patient Instructions (Signed)
 Primary Care-  [phone]  [facility]- [phone]

## 2017-10-03 NOTE — Progress Notes (Signed)
BP 130/76 (BP Location: Left Arm, Patient Position: Sitting, Cuff Size: Normal)   Pulse 82   Temp 98.4 F (36.9 C)   Ht 6' (1.829 m)   Wt 163 lb 4 oz (74 kg)   SpO2 98%   BMI 22.14 kg/m    Subjective:    Patient ID: Michael Rivas, male    DOB: [birth year], 62 y.o.   MRN: [id number]  HPI: Michael Rivas is a 62 y.o. male presenting on 10/03/2017 for Hypertension   HPI   Pt now has medicaid and medicare.    Referral to neurosurgery entered at previous OV.  Pt says he has amlodipine and is not close to running out.   Relevant past medical, surgical, family and social history reviewed and updated as indicated. Interim medical history since our last visit reviewed. Allergies and medications reviewed and updated.   Current Outpatient Medications:  .  amLODipine (NORVASC) 5 MG tablet, Take 1 tablet (5 mg total) by mouth daily., Disp: 30 tablet, Rfl: 3 .  aspirin EC 81 MG tablet, Take 81 mg by mouth daily., Disp: , Rfl:    Review of Systems  Constitutional: Negative for appetite change, chills, diaphoresis, fatigue, fever and unexpected weight change.  HENT: Negative for congestion, dental problem, drooling, ear pain, facial swelling, hearing loss, mouth sores, sneezing, sore throat, trouble swallowing and voice change.   Eyes: Negative for pain, discharge, redness, itching and visual disturbance.  Respiratory: Negative for cough, choking, shortness of breath and wheezing.   Cardiovascular: Negative for chest pain, palpitations and leg swelling.  Gastrointestinal: Negative for abdominal pain, blood in stool, constipation, diarrhea and vomiting.  Endocrine: Negative for cold intolerance, heat intolerance and polydipsia.  Genitourinary: Negative for decreased urine volume, dysuria and hematuria.  Musculoskeletal: Positive for arthralgias and back pain. Negative for gait problem.  Skin: Negative for rash.  Allergic/Immunologic: Negative for environmental allergies.  Neurological:  Negative for seizures, syncope, light-headedness and headaches.  Hematological: Negative for adenopathy.  Psychiatric/Behavioral: Negative for agitation, dysphoric mood and suicidal ideas. The patient is not nervous/anxious.     Per HPI unless specifically indicated above     Objective:    BP 130/76 (BP Location: Left Arm, Patient Position: Sitting, Cuff Size: Normal)   Pulse 82   Temp 98.4 F (36.9 C)   Ht 6' (1.829 m)   Wt 163 lb 4 oz (74 kg)   SpO2 98%   BMI 22.14 kg/m   Wt Readings from Last 3 Encounters:  10/03/17 163 lb 4 oz (74 kg)  08/16/17 165 lb (74.8 kg)  07/04/17 152 lb (68.9 kg)    Physical Exam  Constitutional: He is oriented to person, place, and time. He appears well-developed and well-nourished.  HENT:  Head: Normocephalic and atraumatic.  Pulmonary/Chest: Effort normal. No respiratory distress.  Neurological: He is alert and oriented to person, place, and time.  Psychiatric: He has a normal mood and affect. His behavior is normal.  Nursing note and vitals reviewed.       Assessment & Plan:   Encounter Diagnoses  Name Primary?  . Essential hypertension, benign Yes  . Low back pain, unspecified back pain laterality, unspecified chronicity, with sciatica presence unspecified   . Cigarette nicotine dependence without complication      -discussed with pt that we can no longer be his PCP since he has insurance.  Pt was given phone number for 2 primary care in town that he can contact in  Reference establishing care.  Encouraged him to go ahead and call as sometimes there are waits for new pt appointments. -he is to continue current medication for his bp

## 2017-11-14 DIAGNOSIS — I10 Essential (primary) hypertension: Secondary | ICD-10-CM | POA: Diagnosis not present

## 2017-11-14 DIAGNOSIS — M545 Low back pain: Secondary | ICD-10-CM | POA: Diagnosis not present

## 2017-11-14 DIAGNOSIS — F5101 Primary insomnia: Secondary | ICD-10-CM | POA: Diagnosis not present

## 2017-11-14 DIAGNOSIS — Z6821 Body mass index (BMI) 21.0-21.9, adult: Secondary | ICD-10-CM | POA: Diagnosis not present

## 2017-11-14 DIAGNOSIS — R361 Hematospermia: Secondary | ICD-10-CM | POA: Diagnosis not present

## 2017-11-14 DIAGNOSIS — H9209 Otalgia, unspecified ear: Secondary | ICD-10-CM | POA: Diagnosis not present

## 2017-11-18 DIAGNOSIS — I10 Essential (primary) hypertension: Secondary | ICD-10-CM | POA: Diagnosis not present

## 2017-11-18 DIAGNOSIS — R7301 Impaired fasting glucose: Secondary | ICD-10-CM | POA: Diagnosis not present

## 2017-11-18 DIAGNOSIS — Z125 Encounter for screening for malignant neoplasm of prostate: Secondary | ICD-10-CM | POA: Diagnosis not present

## 2017-11-22 DIAGNOSIS — R7301 Impaired fasting glucose: Secondary | ICD-10-CM | POA: Diagnosis not present

## 2017-11-22 DIAGNOSIS — R361 Hematospermia: Secondary | ICD-10-CM | POA: Diagnosis not present

## 2017-11-22 DIAGNOSIS — M542 Cervicalgia: Secondary | ICD-10-CM | POA: Diagnosis not present

## 2017-11-22 DIAGNOSIS — I10 Essential (primary) hypertension: Secondary | ICD-10-CM | POA: Diagnosis not present

## 2017-11-22 DIAGNOSIS — R945 Abnormal results of liver function studies: Secondary | ICD-10-CM | POA: Diagnosis not present

## 2017-11-22 DIAGNOSIS — Z0001 Encounter for general adult medical examination with abnormal findings: Secondary | ICD-10-CM | POA: Diagnosis not present

## 2017-11-22 DIAGNOSIS — F5101 Primary insomnia: Secondary | ICD-10-CM | POA: Diagnosis not present

## 2017-11-22 DIAGNOSIS — E871 Hypo-osmolality and hyponatremia: Secondary | ICD-10-CM | POA: Diagnosis not present

## 2018-02-20 DIAGNOSIS — R7301 Impaired fasting glucose: Secondary | ICD-10-CM | POA: Diagnosis not present

## 2018-02-20 DIAGNOSIS — R945 Abnormal results of liver function studies: Secondary | ICD-10-CM | POA: Diagnosis not present

## 2018-02-20 DIAGNOSIS — H9209 Otalgia, unspecified ear: Secondary | ICD-10-CM | POA: Diagnosis not present

## 2018-02-20 DIAGNOSIS — F5101 Primary insomnia: Secondary | ICD-10-CM | POA: Diagnosis not present

## 2018-02-20 DIAGNOSIS — R361 Hematospermia: Secondary | ICD-10-CM | POA: Diagnosis not present

## 2018-02-20 DIAGNOSIS — M542 Cervicalgia: Secondary | ICD-10-CM | POA: Diagnosis not present

## 2018-02-20 DIAGNOSIS — Z6821 Body mass index (BMI) 21.0-21.9, adult: Secondary | ICD-10-CM | POA: Diagnosis not present

## 2018-02-20 DIAGNOSIS — E871 Hypo-osmolality and hyponatremia: Secondary | ICD-10-CM | POA: Diagnosis not present

## 2018-02-20 DIAGNOSIS — Z0001 Encounter for general adult medical examination with abnormal findings: Secondary | ICD-10-CM | POA: Diagnosis not present

## 2018-02-20 DIAGNOSIS — I10 Essential (primary) hypertension: Secondary | ICD-10-CM | POA: Diagnosis not present

## 2018-02-20 DIAGNOSIS — M545 Low back pain: Secondary | ICD-10-CM | POA: Diagnosis not present

## 2018-02-23 DIAGNOSIS — E871 Hypo-osmolality and hyponatremia: Secondary | ICD-10-CM | POA: Diagnosis not present

## 2018-02-23 DIAGNOSIS — M542 Cervicalgia: Secondary | ICD-10-CM | POA: Diagnosis not present

## 2018-02-23 DIAGNOSIS — F17200 Nicotine dependence, unspecified, uncomplicated: Secondary | ICD-10-CM | POA: Diagnosis not present

## 2018-02-23 DIAGNOSIS — I10 Essential (primary) hypertension: Secondary | ICD-10-CM | POA: Diagnosis not present

## 2018-02-23 DIAGNOSIS — F5221 Male erectile disorder: Secondary | ICD-10-CM | POA: Diagnosis not present

## 2018-02-23 DIAGNOSIS — E1165 Type 2 diabetes mellitus with hyperglycemia: Secondary | ICD-10-CM | POA: Diagnosis not present

## 2018-02-23 DIAGNOSIS — Z6821 Body mass index (BMI) 21.0-21.9, adult: Secondary | ICD-10-CM | POA: Diagnosis not present

## 2018-02-23 DIAGNOSIS — R945 Abnormal results of liver function studies: Secondary | ICD-10-CM | POA: Diagnosis not present

## 2018-02-23 DIAGNOSIS — Z712 Person consulting for explanation of examination or test findings: Secondary | ICD-10-CM | POA: Diagnosis not present

## 2018-02-23 DIAGNOSIS — M545 Low back pain: Secondary | ICD-10-CM | POA: Diagnosis not present

## 2018-02-23 DIAGNOSIS — F5101 Primary insomnia: Secondary | ICD-10-CM | POA: Diagnosis not present

## 2018-03-09 DIAGNOSIS — Z6822 Body mass index (BMI) 22.0-22.9, adult: Secondary | ICD-10-CM | POA: Diagnosis not present

## 2018-03-09 DIAGNOSIS — N529 Male erectile dysfunction, unspecified: Secondary | ICD-10-CM | POA: Diagnosis not present

## 2018-03-09 DIAGNOSIS — I10 Essential (primary) hypertension: Secondary | ICD-10-CM | POA: Diagnosis not present

## 2018-03-09 DIAGNOSIS — M542 Cervicalgia: Secondary | ICD-10-CM | POA: Diagnosis not present

## 2018-08-17 ENCOUNTER — Ambulatory Visit (INDEPENDENT_AMBULATORY_CARE_PROVIDER_SITE_OTHER): Payer: Medicare Other | Admitting: Otolaryngology

## 2018-08-17 DIAGNOSIS — H66012 Acute suppurative otitis media with spontaneous rupture of ear drum, left ear: Secondary | ICD-10-CM | POA: Diagnosis not present

## 2018-08-17 DIAGNOSIS — H6121 Impacted cerumen, right ear: Secondary | ICD-10-CM

## 2018-09-07 ENCOUNTER — Ambulatory Visit (INDEPENDENT_AMBULATORY_CARE_PROVIDER_SITE_OTHER): Payer: Medicare Other | Admitting: Otolaryngology

## 2018-09-07 DIAGNOSIS — H608X2 Other otitis externa, left ear: Secondary | ICD-10-CM | POA: Diagnosis not present

## 2018-09-13 DIAGNOSIS — I10 Essential (primary) hypertension: Secondary | ICD-10-CM | POA: Diagnosis not present

## 2018-09-13 DIAGNOSIS — R7301 Impaired fasting glucose: Secondary | ICD-10-CM | POA: Diagnosis not present

## 2018-09-13 DIAGNOSIS — N529 Male erectile dysfunction, unspecified: Secondary | ICD-10-CM | POA: Diagnosis not present

## 2018-09-13 DIAGNOSIS — F5221 Male erectile disorder: Secondary | ICD-10-CM | POA: Diagnosis not present

## 2018-09-13 DIAGNOSIS — E1165 Type 2 diabetes mellitus with hyperglycemia: Secondary | ICD-10-CM | POA: Diagnosis not present

## 2018-09-14 DIAGNOSIS — F5221 Male erectile disorder: Secondary | ICD-10-CM | POA: Diagnosis not present

## 2018-09-14 DIAGNOSIS — F17200 Nicotine dependence, unspecified, uncomplicated: Secondary | ICD-10-CM | POA: Diagnosis not present

## 2018-09-14 DIAGNOSIS — E1165 Type 2 diabetes mellitus with hyperglycemia: Secondary | ICD-10-CM | POA: Diagnosis not present

## 2018-09-14 DIAGNOSIS — M545 Low back pain: Secondary | ICD-10-CM | POA: Diagnosis not present

## 2018-09-14 DIAGNOSIS — E875 Hyperkalemia: Secondary | ICD-10-CM | POA: Diagnosis not present

## 2018-09-14 DIAGNOSIS — F5101 Primary insomnia: Secondary | ICD-10-CM | POA: Diagnosis not present

## 2018-09-14 DIAGNOSIS — I10 Essential (primary) hypertension: Secondary | ICD-10-CM | POA: Diagnosis not present

## 2018-09-14 DIAGNOSIS — R945 Abnormal results of liver function studies: Secondary | ICD-10-CM | POA: Diagnosis not present

## 2018-09-14 DIAGNOSIS — E871 Hypo-osmolality and hyponatremia: Secondary | ICD-10-CM | POA: Diagnosis not present

## 2018-12-04 ENCOUNTER — Ambulatory Visit (INDEPENDENT_AMBULATORY_CARE_PROVIDER_SITE_OTHER): Payer: PPO | Admitting: Otolaryngology

## 2018-12-04 DIAGNOSIS — H66012 Acute suppurative otitis media with spontaneous rupture of ear drum, left ear: Secondary | ICD-10-CM | POA: Diagnosis not present

## 2018-12-14 DIAGNOSIS — R7301 Impaired fasting glucose: Secondary | ICD-10-CM | POA: Diagnosis not present

## 2018-12-14 DIAGNOSIS — I10 Essential (primary) hypertension: Secondary | ICD-10-CM | POA: Diagnosis not present

## 2018-12-14 DIAGNOSIS — F5221 Male erectile disorder: Secondary | ICD-10-CM | POA: Diagnosis not present

## 2018-12-14 DIAGNOSIS — Z125 Encounter for screening for malignant neoplasm of prostate: Secondary | ICD-10-CM | POA: Diagnosis not present

## 2018-12-14 DIAGNOSIS — E1165 Type 2 diabetes mellitus with hyperglycemia: Secondary | ICD-10-CM | POA: Diagnosis not present

## 2018-12-14 DIAGNOSIS — N529 Male erectile dysfunction, unspecified: Secondary | ICD-10-CM | POA: Diagnosis not present

## 2018-12-15 DIAGNOSIS — Z0000 Encounter for general adult medical examination without abnormal findings: Secondary | ICD-10-CM | POA: Diagnosis not present

## 2018-12-18 ENCOUNTER — Other Ambulatory Visit: Payer: Self-pay

## 2018-12-18 ENCOUNTER — Ambulatory Visit (INDEPENDENT_AMBULATORY_CARE_PROVIDER_SITE_OTHER): Payer: PPO | Admitting: Otolaryngology

## 2018-12-18 DIAGNOSIS — H66012 Acute suppurative otitis media with spontaneous rupture of ear drum, left ear: Secondary | ICD-10-CM | POA: Diagnosis not present

## 2018-12-21 ENCOUNTER — Ambulatory Visit (HOSPITAL_COMMUNITY)
Admission: RE | Admit: 2018-12-21 | Discharge: 2018-12-21 | Disposition: A | Discharge status: Home / Self Care | Payer: PPO | Source: Ambulatory Visit | Attending: Internal Medicine | Admitting: Internal Medicine

## 2018-12-21 ENCOUNTER — Other Ambulatory Visit: Payer: Self-pay

## 2018-12-21 ENCOUNTER — Other Ambulatory Visit (HOSPITAL_COMMUNITY): Payer: Self-pay | Admitting: Internal Medicine

## 2018-12-21 DIAGNOSIS — F17210 Nicotine dependence, cigarettes, uncomplicated: Secondary | ICD-10-CM | POA: Diagnosis not present

## 2018-12-21 DIAGNOSIS — E875 Hyperkalemia: Secondary | ICD-10-CM | POA: Diagnosis not present

## 2018-12-21 DIAGNOSIS — R05 Cough: Secondary | ICD-10-CM

## 2018-12-21 DIAGNOSIS — H6692 Otitis media, unspecified, left ear: Secondary | ICD-10-CM | POA: Diagnosis not present

## 2018-12-21 DIAGNOSIS — E871 Hypo-osmolality and hyponatremia: Secondary | ICD-10-CM | POA: Diagnosis not present

## 2018-12-21 DIAGNOSIS — M545 Low back pain: Secondary | ICD-10-CM | POA: Diagnosis not present

## 2018-12-21 DIAGNOSIS — E1169 Type 2 diabetes mellitus with other specified complication: Secondary | ICD-10-CM | POA: Diagnosis not present

## 2018-12-21 DIAGNOSIS — I10 Essential (primary) hypertension: Secondary | ICD-10-CM | POA: Diagnosis not present

## 2018-12-21 DIAGNOSIS — R945 Abnormal results of liver function studies: Secondary | ICD-10-CM | POA: Diagnosis not present

## 2018-12-21 DIAGNOSIS — R059 Cough, unspecified: Secondary | ICD-10-CM

## 2019-01-03 DIAGNOSIS — Z712 Person consulting for explanation of examination or test findings: Secondary | ICD-10-CM | POA: Diagnosis not present

## 2019-01-03 DIAGNOSIS — Z0000 Encounter for general adult medical examination without abnormal findings: Secondary | ICD-10-CM | POA: Diagnosis not present

## 2019-01-03 DIAGNOSIS — M545 Low back pain: Secondary | ICD-10-CM | POA: Diagnosis not present

## 2019-01-03 DIAGNOSIS — E871 Hypo-osmolality and hyponatremia: Secondary | ICD-10-CM | POA: Diagnosis not present

## 2019-01-03 DIAGNOSIS — R05 Cough: Secondary | ICD-10-CM | POA: Diagnosis not present

## 2019-01-03 DIAGNOSIS — M542 Cervicalgia: Secondary | ICD-10-CM | POA: Diagnosis not present

## 2019-01-03 DIAGNOSIS — R7301 Impaired fasting glucose: Secondary | ICD-10-CM | POA: Diagnosis not present

## 2019-01-03 DIAGNOSIS — H6692 Otitis media, unspecified, left ear: Secondary | ICD-10-CM | POA: Diagnosis not present

## 2019-01-03 DIAGNOSIS — R638 Other symptoms and signs concerning food and fluid intake: Secondary | ICD-10-CM | POA: Diagnosis not present

## 2019-01-03 DIAGNOSIS — Z6822 Body mass index (BMI) 22.0-22.9, adult: Secondary | ICD-10-CM | POA: Diagnosis not present

## 2019-01-03 DIAGNOSIS — F5221 Male erectile disorder: Secondary | ICD-10-CM | POA: Diagnosis not present

## 2019-01-03 DIAGNOSIS — F17200 Nicotine dependence, unspecified, uncomplicated: Secondary | ICD-10-CM | POA: Diagnosis not present

## 2019-01-03 DIAGNOSIS — E875 Hyperkalemia: Secondary | ICD-10-CM | POA: Diagnosis not present

## 2019-01-03 DIAGNOSIS — E1165 Type 2 diabetes mellitus with hyperglycemia: Secondary | ICD-10-CM | POA: Diagnosis not present

## 2019-01-03 DIAGNOSIS — N529 Male erectile dysfunction, unspecified: Secondary | ICD-10-CM | POA: Diagnosis not present

## 2019-01-03 DIAGNOSIS — F17210 Nicotine dependence, cigarettes, uncomplicated: Secondary | ICD-10-CM | POA: Diagnosis not present

## 2019-01-03 DIAGNOSIS — I10 Essential (primary) hypertension: Secondary | ICD-10-CM | POA: Diagnosis not present

## 2019-01-03 DIAGNOSIS — Z0001 Encounter for general adult medical examination with abnormal findings: Secondary | ICD-10-CM | POA: Diagnosis not present

## 2019-01-08 ENCOUNTER — Other Ambulatory Visit (HOSPITAL_COMMUNITY): Payer: Self-pay | Admitting: Otolaryngology

## 2019-01-08 ENCOUNTER — Other Ambulatory Visit: Payer: Self-pay | Admitting: Otolaryngology

## 2019-01-08 DIAGNOSIS — H7292 Unspecified perforation of tympanic membrane, left ear: Secondary | ICD-10-CM

## 2019-01-17 ENCOUNTER — Emergency Department (HOSPITAL_COMMUNITY): Payer: PPO

## 2019-01-17 ENCOUNTER — Other Ambulatory Visit: Payer: Self-pay

## 2019-01-17 ENCOUNTER — Encounter (HOSPITAL_COMMUNITY): Payer: Self-pay | Admitting: Emergency Medicine

## 2019-01-17 ENCOUNTER — Emergency Department (HOSPITAL_COMMUNITY)
Admission: EM | Admit: 2019-01-17 | Discharge: 2019-01-17 | Disposition: A | Discharge status: Home / Self Care | Payer: PPO | Attending: Emergency Medicine | Admitting: Emergency Medicine

## 2019-01-17 DIAGNOSIS — Y929 Unspecified place or not applicable: Secondary | ICD-10-CM | POA: Insufficient documentation

## 2019-01-17 DIAGNOSIS — W19XXXA Unspecified fall, initial encounter: Secondary | ICD-10-CM | POA: Insufficient documentation

## 2019-01-17 DIAGNOSIS — S7012XA Contusion of left thigh, initial encounter: Secondary | ICD-10-CM | POA: Diagnosis not present

## 2019-01-17 DIAGNOSIS — M62838 Other muscle spasm: Secondary | ICD-10-CM

## 2019-01-17 DIAGNOSIS — Z7982 Long term (current) use of aspirin: Secondary | ICD-10-CM | POA: Insufficient documentation

## 2019-01-17 DIAGNOSIS — Y939 Activity, unspecified: Secondary | ICD-10-CM | POA: Insufficient documentation

## 2019-01-17 DIAGNOSIS — S7002XA Contusion of left hip, initial encounter: Secondary | ICD-10-CM | POA: Diagnosis not present

## 2019-01-17 DIAGNOSIS — F17210 Nicotine dependence, cigarettes, uncomplicated: Secondary | ICD-10-CM | POA: Insufficient documentation

## 2019-01-17 DIAGNOSIS — I10 Essential (primary) hypertension: Secondary | ICD-10-CM | POA: Diagnosis not present

## 2019-01-17 DIAGNOSIS — Y999 Unspecified external cause status: Secondary | ICD-10-CM | POA: Insufficient documentation

## 2019-01-17 DIAGNOSIS — S79912A Unspecified injury of left hip, initial encounter: Secondary | ICD-10-CM | POA: Diagnosis present

## 2019-01-17 DIAGNOSIS — M25552 Pain in left hip: Secondary | ICD-10-CM | POA: Diagnosis not present

## 2019-01-17 MED ORDER — DEXAMETHASONE SODIUM PHOSPHATE 10 MG/ML IJ SOLN
10.0000 mg | Freq: Once | INTRAMUSCULAR | Status: AC
Start: 2019-01-17 — End: 2019-01-17
  Administered 2019-01-17: 10 mg via INTRAMUSCULAR
  Filled 2019-01-17: qty 1

## 2019-01-17 MED ORDER — TRAMADOL HCL 50 MG PO TABS
100.0000 mg | ORAL_TABLET | Freq: Once | ORAL | Status: AC
  Administered 2019-01-17: 100 mg via ORAL
  Filled 2019-01-17: qty 2

## 2019-01-17 MED ORDER — CYCLOBENZAPRINE HCL 10 MG PO TABS
10.0000 mg | ORAL_TABLET | Freq: Once | ORAL | Status: AC
  Administered 2019-01-17: 10 mg via ORAL
  Filled 2019-01-17: qty 1

## 2019-01-17 MED ORDER — TRAMADOL HCL 50 MG PO TABS: 50.0000 mg | ORAL_TABLET | Freq: Four times a day (QID) | ORAL | 0 refills | Status: DC | PRN

## 2019-01-17 MED ORDER — BACLOFEN 20 MG PO TABS: 20.0000 mg | ORAL_TABLET | Freq: Three times a day (TID) | ORAL | 0 refills | Status: DC

## 2019-01-17 NOTE — Discharge Instructions (Addendum)
The x-ray of your hip and pelvis negative for fracture or dislocation.  Your examination reveals some mild mild to moderate muscle spasm in this area.  Please use a heating pad while you are at rest.  Please use baclofen 3 times daily for spasm pain.  Please use Tylenol extra strength with breakfast, lunch, dinner, and at bedtime.  May use Ultram for more severe pain.  Ultram and baclofen may cause drowsiness and/or lightheadedness.  Please do not drive a vehicle, operate machinery, drink alcohol, or participate in activities requiring concentration when taking either these medications.  Please see Dr. Nevada Crane for additional management of this pain and spasm.

## 2019-01-17 NOTE — ED Notes (Signed)
Pt left with friend

## 2019-01-17 NOTE — ED Triage Notes (Signed)
Patient c/o pain to his L hip after a fall on Sunday. Difficulty with ambulation.

## 2019-01-17 NOTE — ED Provider Notes (Signed)
[facility] EMERGENCY DEPARTMENT Provider Note   CSN: [id number] Arrival date & time: 01/17/19  1314     History   Chief Complaint Chief Complaint  Patient presents with  . Fall    HPI Michael Rivas is a 63 y.o. male.     Patient is a 63 year old male who presents to the emergency department following a fall.  The patient states that he fell on Sunday June 14.  He injured his left hip and thigh area.  The patient states initially he did not have much discomfort, but as the days have progressed his pain is getting progressively worse.  He says it is particularly painful when he is walking.  He has been trying a heating pad, but says this is not helping.  He presents now for assistance with this issue.  The patient denies hitting his head.  He denies injuring his chest.  Patient denies being on any anticoagulation medications.  The history is provided by the patient.  Fall Pertinent negatives include no chest pain, no abdominal pain and no shortness of breath.    Past Medical History:  Diagnosis Date  . Hypertension 2012    Patient Active Problem List   Diagnosis Date Noted  . Essential hypertension, benign 03/02/2016  . Other tobacco product nicotine dependence with nicotine-induced disorder 03/02/2016  . Depression 03/02/2016  . Chronic pain 03/02/2016    Past Surgical History:  Procedure Laterality Date  . BACK SURGERY  2016  . CHOLECYSTECTOMY  2007        Home Medications    Prior to Admission medications   Medication Sig Start Date End Date Taking? Authorizing Provider  amLODipine (NORVASC) 5 MG tablet Take 1 tablet (5 mg total) by mouth daily. 07/04/17   Jacquelin Hawking, PA-C  aspirin EC 81 MG tablet Take 81 mg by mouth daily.    [provider]    Family History Family History  Problem Relation Age of Onset  . Stroke Mother   . Diabetes Mother   . Hypertension Mother   . Heart failure Father   . Heart attack Father   . Diabetes Maternal  Aunt   . Heart attack Maternal Aunt   . Stroke Maternal Uncle   . Stroke Paternal Uncle   . Stroke Maternal Uncle   . Heart attack Paternal Uncle     Social History Social History   Tobacco Use  . Smoking status: Current Every Day Smoker    Packs/day: 0.50    Years: 45.00    Pack years: 22.50    Types: Cigars, Cigarettes  . Smokeless tobacco: Never Used  . Tobacco comment: smokes 2-3 cigars/daily.  former cigarette smoker , quit 2016, 0.5 ppd  Substance Use Topics  . Alcohol use: Yes    Alcohol/week: 35.0 standard drinks    Types: 35 Cans of beer per week    Comment: every other day  . Drug use: No     Allergies   Patient has no known allergies.   Review of Systems Review of Systems  Constitutional: Negative for activity change.       All ROS Neg except as noted in HPI  HENT: Negative.   Eyes: Negative for photophobia and discharge.  Respiratory: Negative for cough, shortness of breath and wheezing.   Cardiovascular: Negative for chest pain and palpitations.  Gastrointestinal: Negative for abdominal pain and blood in stool.  Genitourinary: Negative for dysuria, frequency and hematuria.  Musculoskeletal: Positive for arthralgias. Negative for back  pain and neck pain.  Skin: Negative.   Neurological: Negative for dizziness, seizures and speech difficulty.  Psychiatric/Behavioral: Negative for confusion and hallucinations.     Physical Exam Updated Vital Signs BP (!) 141/92 (BP Location: Right Arm)   Pulse 72   Temp 97.8 F (36.6 C) (Oral)   Resp 14   SpO2 100%   Physical Exam Vitals signs and nursing note reviewed.  Constitutional:      Appearance: He is well-developed. He is not toxic-appearing.  HENT:     Head: Normocephalic.     Right Ear: Tympanic membrane and external ear normal.     Left Ear: Tympanic membrane and external ear normal.  Eyes:     General: Lids are normal.     Pupils: Pupils are equal, round, and reactive to light.  Neck:      Musculoskeletal: Normal range of motion and neck supple.     Vascular: No carotid bruit.  Cardiovascular:     Rate and Rhythm: Normal rate and regular rhythm.     Pulses: Normal pulses.     Heart sounds: Normal heart sounds.  Pulmonary:     Effort: No respiratory distress.     Breath sounds: Normal breath sounds.  Abdominal:     General: Bowel sounds are normal.     Palpations: Abdomen is soft.     Tenderness: There is no abdominal tenderness. There is no guarding.  Musculoskeletal: Normal range of motion.     Left hip: He exhibits tenderness.     Comments: There is pain and soreness to range of motion of the left hip.  There is some spasm from the hip extending into the left buttocks.  There is no palpable step-off of the lumbar or lower thoracic spine.  There is no deformity of the thigh area, knee area, or tib-fib area.  Lymphadenopathy:     Head:     Right side of head: No submandibular adenopathy.     Left side of head: No submandibular adenopathy.     Cervical: No cervical adenopathy.  Skin:    General: Skin is warm and dry.  Neurological:     Mental Status: He is alert and oriented to person, place, and time.     Cranial Nerves: No cranial nerve deficit.     Sensory: No sensory deficit.  Psychiatric:        Speech: Speech normal.      ED Treatments / Results  Labs (all labs ordered are listed, but only abnormal results are displayed) Labs Reviewed - No data to display  EKG    Radiology Dg Hip Unilat W Or Wo Pelvis 2-3 Views Left  Result Date: 01/17/2019 CLINICAL DATA:  Fall with hip pain EXAM: DG HIP (WITH OR WITHOUT PELVIS) 2-3V LEFT COMPARISON:  None. FINDINGS: There is no evidence of hip fracture or dislocation. There is no evidence of arthropathy or other focal bone abnormality. IMPRESSION: Negative. Electronically Signed   By: Donavan Foil M.D.   On: 01/17/2019 14:42    Procedures Procedures (including critical care time)  Medications Ordered in ED  Medications - No data to display   Initial Impression / Assessment and Plan / ED Course  I have reviewed the triage vital signs and the nursing notes.  Pertinent labs & imaging results that were available during my care of the patient were reviewed by me and considered in my medical decision making (see chart for details).  Final Clinical Impressions(s) / ED Diagnoses MDM  Pulse oximetry is 100% on room air.  Within normal limits by my interpretation.  Vital signs reviewed.  Patient sustained a fall and injured the hip area and thigh area on the left.  On examination is no evidence for cauda equina or other emergent changes.    X-ray of the left hip and pelvis negative for fracture or dislocation.  I ambulated the patient and he is able to walk, but with soreness.  I have asked the patient use a heating pad when at rest.  Prescription for baclofen and Ultram given to the patient.  The patient is to use extra strength Tylenol with breakfast, lunch, dinner, and at bedtime.  The patient is to follow-up with Dr. Margo AyeHall concerning his fall in the hip thigh injury.  Patient will return to the emergency department if any emergent changes in his condition, problems, or concerns.   Final diagnoses:  Contusion, hip and thigh, left, initial encounter  Muscle spasm    ED Discharge Orders         Ordered    baclofen (LIORESAL) 20 MG tablet  3 times daily     01/17/19 1547    traMADol (ULTRAM) 50 MG tablet  Every 6 hours PRN     01/17/19 1547           Ivery QualeBryant, Al Bracewell, PA-C 01/17/19 1556    Vanetta MuldersZackowski, Scott, MD 01/17/19 1659

## 2019-01-24 ENCOUNTER — Other Ambulatory Visit (HOSPITAL_COMMUNITY): Payer: PPO

## 2019-01-24 ENCOUNTER — Encounter (HOSPITAL_COMMUNITY): Payer: Self-pay

## 2019-01-24 ENCOUNTER — Ambulatory Visit (HOSPITAL_COMMUNITY): Payer: PPO

## 2019-03-23 DIAGNOSIS — R110 Nausea: Secondary | ICD-10-CM | POA: Diagnosis not present

## 2019-03-23 DIAGNOSIS — N529 Male erectile dysfunction, unspecified: Secondary | ICD-10-CM | POA: Diagnosis not present

## 2019-03-23 DIAGNOSIS — Z6822 Body mass index (BMI) 22.0-22.9, adult: Secondary | ICD-10-CM | POA: Diagnosis not present

## 2019-03-23 DIAGNOSIS — E875 Hyperkalemia: Secondary | ICD-10-CM | POA: Diagnosis not present

## 2019-03-23 DIAGNOSIS — R197 Diarrhea, unspecified: Secondary | ICD-10-CM | POA: Diagnosis not present

## 2019-03-23 DIAGNOSIS — R7301 Impaired fasting glucose: Secondary | ICD-10-CM | POA: Diagnosis not present

## 2019-03-23 DIAGNOSIS — F5221 Male erectile disorder: Secondary | ICD-10-CM | POA: Diagnosis not present

## 2019-03-23 DIAGNOSIS — F17200 Nicotine dependence, unspecified, uncomplicated: Secondary | ICD-10-CM | POA: Diagnosis not present

## 2019-03-23 DIAGNOSIS — Z712 Person consulting for explanation of examination or test findings: Secondary | ICD-10-CM | POA: Diagnosis not present

## 2019-03-23 DIAGNOSIS — Z0001 Encounter for general adult medical examination with abnormal findings: Secondary | ICD-10-CM | POA: Diagnosis not present

## 2019-03-23 DIAGNOSIS — R10816 Epigastric abdominal tenderness: Secondary | ICD-10-CM | POA: Diagnosis not present

## 2019-03-23 DIAGNOSIS — M542 Cervicalgia: Secondary | ICD-10-CM | POA: Diagnosis not present

## 2019-03-23 DIAGNOSIS — R638 Other symptoms and signs concerning food and fluid intake: Secondary | ICD-10-CM | POA: Diagnosis not present

## 2019-03-23 DIAGNOSIS — Z0000 Encounter for general adult medical examination without abnormal findings: Secondary | ICD-10-CM | POA: Diagnosis not present

## 2019-03-23 DIAGNOSIS — E1165 Type 2 diabetes mellitus with hyperglycemia: Secondary | ICD-10-CM | POA: Diagnosis not present

## 2019-04-16 DIAGNOSIS — H6690 Otitis media, unspecified, unspecified ear: Secondary | ICD-10-CM | POA: Diagnosis not present

## 2019-05-25 ENCOUNTER — Other Ambulatory Visit: Payer: Self-pay

## 2019-05-25 ENCOUNTER — Emergency Department (HOSPITAL_COMMUNITY)
Admission: EM | Admit: 2019-05-25 | Discharge: 2019-05-25 | Disposition: A | Discharge status: Home / Self Care | Payer: PPO | Attending: Emergency Medicine | Admitting: Emergency Medicine

## 2019-05-25 ENCOUNTER — Encounter (HOSPITAL_COMMUNITY): Payer: Self-pay | Admitting: Emergency Medicine

## 2019-05-25 DIAGNOSIS — H60312 Diffuse otitis externa, left ear: Secondary | ICD-10-CM | POA: Insufficient documentation

## 2019-05-25 DIAGNOSIS — H9202 Otalgia, left ear: Secondary | ICD-10-CM | POA: Diagnosis present

## 2019-05-25 DIAGNOSIS — I10 Essential (primary) hypertension: Secondary | ICD-10-CM | POA: Diagnosis not present

## 2019-05-25 DIAGNOSIS — Z79899 Other long term (current) drug therapy: Secondary | ICD-10-CM | POA: Diagnosis not present

## 2019-05-25 DIAGNOSIS — F17210 Nicotine dependence, cigarettes, uncomplicated: Secondary | ICD-10-CM | POA: Diagnosis not present

## 2019-05-25 DIAGNOSIS — Z7982 Long term (current) use of aspirin: Secondary | ICD-10-CM | POA: Insufficient documentation

## 2019-05-25 MED ORDER — CIPROFLOXACIN-FLUOCINOLONE PF 0.3-0.025 % OT SOLN: 0.2500 mL | Freq: Two times a day (BID) | OTIC | 0 refills | Status: AC

## 2019-05-25 NOTE — ED Triage Notes (Signed)
Pt states that he has been treated for a ear infection. He states that he has blood coming out of it. This has been going on for a month

## 2019-05-25 NOTE — Discharge Instructions (Signed)
Use antibiotic drops as directed and follow-up with ENT specialist on Monday. Return for significant worsening of facial swelling, breathing difficulty, persistent fevers or new acute stroke. Tylenol and ice for pain.

## 2019-05-25 NOTE — ED Provider Notes (Signed)
[facility] EMERGENCY DEPARTMENT Provider Note   CSN: [id number] Arrival date & time: 05/25/19  4967     History   Chief Complaint Chief Complaint  Patient presents with  . Otalgia    HPI Michael Rivas is a 63 y.o. male.     Patient presents with worsening ear pain and now drainage with some blood in left ear.  Patient's had this multiple times in the past.  Patient understands he needs follow-up with ENT has he was told he may need a CT scan.  Patient denies any documented fevers.  Patient's had mild cough.  No significant headache.  Mild ear discomfort extending into left jaw area.     Past Medical History:  Diagnosis Date  . Hypertension 2012    Patient Active Problem List   Diagnosis Date Noted  . Essential hypertension, benign 03/02/2016  . Other tobacco product nicotine dependence with nicotine-induced disorder 03/02/2016  . Depression 03/02/2016  . Chronic pain 03/02/2016    Past Surgical History:  Procedure Laterality Date  . BACK SURGERY  2016  . CHOLECYSTECTOMY  2007        Home Medications    Prior to Admission medications   Medication Sig Start Date End Date Taking? Authorizing Provider  amLODipine (NORVASC) 5 MG tablet Take 1 tablet (5 mg total) by mouth daily. 07/04/17   Soyla Dryer, PA-C  aspirin EC 81 MG tablet Take 81 mg by mouth daily.    [provider]  baclofen (LIORESAL) 20 MG tablet Take 1 tablet (20 mg total) by mouth 3 (three) times daily. 01/17/19   Lily Kocher, PA-C  ciprofloxacin-fluocinolone PF (OTOVEL) 0.3-0.025 % SOLN Place 0.25 mLs into the left ear 2 (two) times daily for 7 days. 05/25/19 06/01/19  Elnora Morrison, MD  traMADol (ULTRAM) 50 MG tablet Take 1 tablet (50 mg total) by mouth every 6 (six) hours as needed. 01/17/19   Lily Kocher, PA-C    Family History Family History  Problem Relation Age of Onset  . Stroke Mother   . Diabetes Mother   . Hypertension Mother   . Heart failure Father   . Heart  attack Father   . Diabetes Maternal Aunt   . Heart attack Maternal Aunt   . Stroke Maternal Uncle   . Stroke Paternal Uncle   . Stroke Maternal Uncle   . Heart attack Paternal Uncle     Social History Social History   Tobacco Use  . Smoking status: Current Every Day Smoker    Packs/day: 0.50    Years: 45.00    Pack years: 22.50    Types: Cigars, Cigarettes  . Smokeless tobacco: Never Used  . Tobacco comment: smokes 2-3 cigars/daily.  former cigarette smoker , quit 2016, 0.5 ppd  Substance Use Topics  . Alcohol use: Yes    Alcohol/week: 35.0 standard drinks    Types: 35 Cans of beer per week    Comment: every other day  . Drug use: No     Allergies   Patient has no known allergies.   Review of Systems Review of Systems  Constitutional: Negative for chills and fever.  HENT: Positive for congestion, ear discharge and ear pain.   Respiratory: Positive for cough. Negative for shortness of breath.   Cardiovascular: Negative for chest pain.  Gastrointestinal: Negative for abdominal pain and vomiting.  Musculoskeletal: Negative for back pain, neck pain and neck stiffness.  Skin: Negative for rash.  Neurological: Negative for light-headedness and headaches.  Physical Exam Updated Vital Signs BP (!) 154/90 (BP Location: Right Arm)   Pulse 87   Temp 97.6 F (36.4 C) (Oral)   Resp 12   Ht 6' (1.829 m)   Wt 72.6 kg   SpO2 100%   BMI 21.70 kg/m   Physical Exam Vitals signs and nursing note reviewed.  Constitutional:      Appearance: He is well-developed.  HENT:     Head: Normocephalic and atraumatic.     Comments: Patient has mild periauricular lymphadenopathy and tenderness, no mastoid tenderness or swelling on the left.  Patient has clinically diffuse inflammation with mild purulence left ear canal.  No trismus. Eyes:     General:        Right eye: No discharge.        Left eye: No discharge.     Conjunctiva/sclera: Conjunctivae normal.  Neck:      Musculoskeletal: Normal range of motion and neck supple. No neck rigidity or muscular tenderness.     Trachea: No tracheal deviation.  Cardiovascular:     Rate and Rhythm: Normal rate.  Pulmonary:     Effort: Pulmonary effort is normal.  Abdominal:     General: There is no distension.  Skin:    General: Skin is warm.     Findings: No rash.  Neurological:     Mental Status: He is alert and oriented to person, place, and time.      ED Treatments / Results  Labs (all labs ordered are listed, but only abnormal results are displayed) Labs Reviewed - No data to display  EKG None  Radiology No results found.  Procedures Procedures (including critical care time)  Medications Ordered in ED Medications - No data to display   Initial Impression / Assessment and Plan / ED Course  I have reviewed the triage vital signs and the nursing notes.  Pertinent labs & imaging results that were available during my care of the patient were reviewed by me and considered in my medical decision making (see chart for details).        Patient presents with clinically otitis externa left ear.  No mastoid tenderness.  Patient is nondiabetic.  Discussed importance of follow-up with ENT and primary doctor and reasons to return.  Plan for antibiotic eardrops Cipro Dex or equivalent.  Final Clinical Impressions(s) / ED Diagnoses   Final diagnoses:  Acute diffuse otitis externa of left ear    ED Discharge Orders         Ordered    ciprofloxacin-fluocinolone PF (OTOVEL) 0.3-0.025 % SOLN  2 times daily     05/25/19 1059           Blane Ohara, MD 05/25/19 1102

## 2019-05-28 DIAGNOSIS — H60332 Swimmer's ear, left ear: Secondary | ICD-10-CM | POA: Diagnosis not present

## 2019-05-28 DIAGNOSIS — F17200 Nicotine dependence, unspecified, uncomplicated: Secondary | ICD-10-CM | POA: Insufficient documentation

## 2019-05-28 DIAGNOSIS — H9202 Otalgia, left ear: Secondary | ICD-10-CM | POA: Diagnosis not present

## 2019-06-04 ENCOUNTER — Other Ambulatory Visit: Payer: Self-pay | Admitting: Otolaryngology

## 2019-06-04 DIAGNOSIS — H60332 Swimmer's ear, left ear: Secondary | ICD-10-CM

## 2019-06-04 DIAGNOSIS — H9202 Otalgia, left ear: Secondary | ICD-10-CM

## 2019-06-07 ENCOUNTER — Other Ambulatory Visit: Payer: Self-pay

## 2019-06-07 ENCOUNTER — Ambulatory Visit
Admission: RE | Admit: 2019-06-07 | Discharge: 2019-06-07 | Disposition: A | Discharge status: Home / Self Care | Payer: PPO | Source: Ambulatory Visit | Attending: Otolaryngology | Admitting: Otolaryngology

## 2019-06-07 DIAGNOSIS — H60332 Swimmer's ear, left ear: Secondary | ICD-10-CM

## 2019-06-07 DIAGNOSIS — H9202 Otalgia, left ear: Secondary | ICD-10-CM | POA: Diagnosis not present

## 2019-06-08 ENCOUNTER — Other Ambulatory Visit: Payer: PPO

## 2019-06-21 DIAGNOSIS — H7192 Unspecified cholesteatoma, left ear: Secondary | ICD-10-CM | POA: Diagnosis not present

## 2019-06-26 DIAGNOSIS — H9072 Mixed conductive and sensorineural hearing loss, unilateral, left ear, with unrestricted hearing on the contralateral side: Secondary | ICD-10-CM | POA: Diagnosis not present

## 2019-06-26 DIAGNOSIS — H7122 Cholesteatoma of mastoid, left ear: Secondary | ICD-10-CM | POA: Diagnosis not present

## 2019-07-09 DIAGNOSIS — Z01818 Encounter for other preprocedural examination: Secondary | ICD-10-CM | POA: Diagnosis not present

## 2019-07-14 DIAGNOSIS — E871 Hypo-osmolality and hyponatremia: Secondary | ICD-10-CM | POA: Diagnosis not present

## 2019-07-16 DIAGNOSIS — E871 Hypo-osmolality and hyponatremia: Secondary | ICD-10-CM | POA: Diagnosis not present

## 2019-07-16 DIAGNOSIS — Z0001 Encounter for general adult medical examination with abnormal findings: Secondary | ICD-10-CM | POA: Diagnosis not present

## 2019-07-16 DIAGNOSIS — H6690 Otitis media, unspecified, unspecified ear: Secondary | ICD-10-CM | POA: Diagnosis not present

## 2019-07-16 DIAGNOSIS — R110 Nausea: Secondary | ICD-10-CM | POA: Diagnosis not present

## 2019-07-16 DIAGNOSIS — E1169 Type 2 diabetes mellitus with other specified complication: Secondary | ICD-10-CM | POA: Diagnosis not present

## 2019-07-16 DIAGNOSIS — R05 Cough: Secondary | ICD-10-CM | POA: Diagnosis not present

## 2019-07-16 DIAGNOSIS — R10816 Epigastric abdominal tenderness: Secondary | ICD-10-CM | POA: Diagnosis not present

## 2019-07-16 DIAGNOSIS — Z0000 Encounter for general adult medical examination without abnormal findings: Secondary | ICD-10-CM | POA: Diagnosis not present

## 2019-07-16 DIAGNOSIS — F5101 Primary insomnia: Secondary | ICD-10-CM | POA: Diagnosis not present

## 2019-07-16 DIAGNOSIS — F5221 Male erectile disorder: Secondary | ICD-10-CM | POA: Diagnosis not present

## 2019-07-16 DIAGNOSIS — E1165 Type 2 diabetes mellitus with hyperglycemia: Secondary | ICD-10-CM | POA: Diagnosis not present

## 2019-07-16 DIAGNOSIS — H6692 Otitis media, unspecified, left ear: Secondary | ICD-10-CM | POA: Diagnosis not present

## 2019-08-21 DIAGNOSIS — Z0000 Encounter for general adult medical examination without abnormal findings: Secondary | ICD-10-CM | POA: Diagnosis not present

## 2019-08-21 DIAGNOSIS — R110 Nausea: Secondary | ICD-10-CM | POA: Diagnosis not present

## 2019-08-21 DIAGNOSIS — R638 Other symptoms and signs concerning food and fluid intake: Secondary | ICD-10-CM | POA: Diagnosis not present

## 2019-08-21 DIAGNOSIS — F5221 Male erectile disorder: Secondary | ICD-10-CM | POA: Diagnosis not present

## 2019-08-21 DIAGNOSIS — Z712 Person consulting for explanation of examination or test findings: Secondary | ICD-10-CM | POA: Diagnosis not present

## 2019-08-21 DIAGNOSIS — N529 Male erectile dysfunction, unspecified: Secondary | ICD-10-CM | POA: Diagnosis not present

## 2019-08-21 DIAGNOSIS — E1165 Type 2 diabetes mellitus with hyperglycemia: Secondary | ICD-10-CM | POA: Diagnosis not present

## 2019-08-21 DIAGNOSIS — R10816 Epigastric abdominal tenderness: Secondary | ICD-10-CM | POA: Diagnosis not present

## 2019-08-21 DIAGNOSIS — F17200 Nicotine dependence, unspecified, uncomplicated: Secondary | ICD-10-CM | POA: Diagnosis not present

## 2019-08-21 DIAGNOSIS — R197 Diarrhea, unspecified: Secondary | ICD-10-CM | POA: Diagnosis not present

## 2019-08-21 DIAGNOSIS — E875 Hyperkalemia: Secondary | ICD-10-CM | POA: Diagnosis not present

## 2019-08-21 DIAGNOSIS — Z6822 Body mass index (BMI) 22.0-22.9, adult: Secondary | ICD-10-CM | POA: Diagnosis not present

## 2019-08-23 DIAGNOSIS — Z716 Tobacco abuse counseling: Secondary | ICD-10-CM | POA: Diagnosis not present

## 2019-08-23 DIAGNOSIS — F5221 Male erectile disorder: Secondary | ICD-10-CM | POA: Diagnosis not present

## 2019-08-23 DIAGNOSIS — E1169 Type 2 diabetes mellitus with other specified complication: Secondary | ICD-10-CM | POA: Diagnosis not present

## 2019-08-23 DIAGNOSIS — H6690 Otitis media, unspecified, unspecified ear: Secondary | ICD-10-CM | POA: Diagnosis not present

## 2019-08-23 DIAGNOSIS — H6692 Otitis media, unspecified, left ear: Secondary | ICD-10-CM | POA: Diagnosis not present

## 2019-08-23 DIAGNOSIS — E871 Hypo-osmolality and hyponatremia: Secondary | ICD-10-CM | POA: Diagnosis not present

## 2019-08-23 DIAGNOSIS — M545 Low back pain: Secondary | ICD-10-CM | POA: Diagnosis not present

## 2019-08-23 DIAGNOSIS — H9209 Otalgia, unspecified ear: Secondary | ICD-10-CM | POA: Diagnosis not present

## 2019-08-23 DIAGNOSIS — F17210 Nicotine dependence, cigarettes, uncomplicated: Secondary | ICD-10-CM | POA: Diagnosis not present

## 2019-08-23 DIAGNOSIS — E1165 Type 2 diabetes mellitus with hyperglycemia: Secondary | ICD-10-CM | POA: Diagnosis not present

## 2019-08-23 DIAGNOSIS — F5101 Primary insomnia: Secondary | ICD-10-CM | POA: Diagnosis not present

## 2019-08-23 DIAGNOSIS — I10 Essential (primary) hypertension: Secondary | ICD-10-CM | POA: Diagnosis not present

## 2019-08-23 DIAGNOSIS — F17200 Nicotine dependence, unspecified, uncomplicated: Secondary | ICD-10-CM | POA: Diagnosis not present

## 2019-08-23 DIAGNOSIS — E875 Hyperkalemia: Secondary | ICD-10-CM | POA: Diagnosis not present

## 2019-10-08 DIAGNOSIS — H6690 Otitis media, unspecified, unspecified ear: Secondary | ICD-10-CM | POA: Diagnosis not present

## 2019-10-08 DIAGNOSIS — E871 Hypo-osmolality and hyponatremia: Secondary | ICD-10-CM | POA: Diagnosis not present

## 2019-10-08 DIAGNOSIS — I10 Essential (primary) hypertension: Secondary | ICD-10-CM | POA: Diagnosis not present

## 2019-10-15 DIAGNOSIS — Z716 Tobacco abuse counseling: Secondary | ICD-10-CM | POA: Diagnosis not present

## 2019-10-15 DIAGNOSIS — F17200 Nicotine dependence, unspecified, uncomplicated: Secondary | ICD-10-CM | POA: Diagnosis not present

## 2019-10-15 DIAGNOSIS — Z712 Person consulting for explanation of examination or test findings: Secondary | ICD-10-CM | POA: Diagnosis not present

## 2019-10-15 DIAGNOSIS — N529 Male erectile dysfunction, unspecified: Secondary | ICD-10-CM | POA: Diagnosis not present

## 2019-10-15 DIAGNOSIS — R638 Other symptoms and signs concerning food and fluid intake: Secondary | ICD-10-CM | POA: Diagnosis not present

## 2019-10-15 DIAGNOSIS — E875 Hyperkalemia: Secondary | ICD-10-CM | POA: Diagnosis not present

## 2019-10-15 DIAGNOSIS — R197 Diarrhea, unspecified: Secondary | ICD-10-CM | POA: Diagnosis not present

## 2019-10-15 DIAGNOSIS — Z0000 Encounter for general adult medical examination without abnormal findings: Secondary | ICD-10-CM | POA: Diagnosis not present

## 2019-10-15 DIAGNOSIS — F5221 Male erectile disorder: Secondary | ICD-10-CM | POA: Diagnosis not present

## 2019-10-15 DIAGNOSIS — E1165 Type 2 diabetes mellitus with hyperglycemia: Secondary | ICD-10-CM | POA: Diagnosis not present

## 2019-10-15 DIAGNOSIS — Z6822 Body mass index (BMI) 22.0-22.9, adult: Secondary | ICD-10-CM | POA: Diagnosis not present

## 2019-10-15 DIAGNOSIS — R10816 Epigastric abdominal tenderness: Secondary | ICD-10-CM | POA: Diagnosis not present

## 2019-10-24 ENCOUNTER — Emergency Department (HOSPITAL_COMMUNITY): Payer: PPO

## 2019-10-24 ENCOUNTER — Other Ambulatory Visit: Payer: Self-pay

## 2019-10-24 ENCOUNTER — Observation Stay (HOSPITAL_COMMUNITY)
Admission: EM | Admit: 2019-10-24 | Discharge: 2019-10-26 | Disposition: A | Discharge status: Home / Self Care | Payer: PPO | Attending: Emergency Medicine | Admitting: Emergency Medicine

## 2019-10-24 ENCOUNTER — Encounter (HOSPITAL_COMMUNITY): Payer: Self-pay | Admitting: Emergency Medicine

## 2019-10-24 DIAGNOSIS — I214 Non-ST elevation (NSTEMI) myocardial infarction: Secondary | ICD-10-CM | POA: Diagnosis not present

## 2019-10-24 DIAGNOSIS — R739 Hyperglycemia, unspecified: Secondary | ICD-10-CM | POA: Insufficient documentation

## 2019-10-24 DIAGNOSIS — R911 Solitary pulmonary nodule: Secondary | ICD-10-CM

## 2019-10-24 DIAGNOSIS — R7303 Prediabetes: Secondary | ICD-10-CM

## 2019-10-24 DIAGNOSIS — I259 Chronic ischemic heart disease, unspecified: Secondary | ICD-10-CM | POA: Diagnosis not present

## 2019-10-24 DIAGNOSIS — I10 Essential (primary) hypertension: Secondary | ICD-10-CM | POA: Diagnosis not present

## 2019-10-24 DIAGNOSIS — F10229 Alcohol dependence with intoxication, unspecified: Secondary | ICD-10-CM | POA: Diagnosis not present

## 2019-10-24 DIAGNOSIS — E871 Hypo-osmolality and hyponatremia: Secondary | ICD-10-CM | POA: Diagnosis not present

## 2019-10-24 DIAGNOSIS — F17210 Nicotine dependence, cigarettes, uncomplicated: Secondary | ICD-10-CM | POA: Diagnosis not present

## 2019-10-24 DIAGNOSIS — Z955 Presence of coronary angioplasty implant and graft: Secondary | ICD-10-CM | POA: Insufficient documentation

## 2019-10-24 DIAGNOSIS — R7989 Other specified abnormal findings of blood chemistry: Secondary | ICD-10-CM | POA: Diagnosis not present

## 2019-10-24 DIAGNOSIS — I2510 Atherosclerotic heart disease of native coronary artery without angina pectoris: Secondary | ICD-10-CM | POA: Diagnosis not present

## 2019-10-24 DIAGNOSIS — Z20822 Contact with and (suspected) exposure to covid-19: Secondary | ICD-10-CM | POA: Insufficient documentation

## 2019-10-24 DIAGNOSIS — Z9861 Coronary angioplasty status: Secondary | ICD-10-CM

## 2019-10-24 DIAGNOSIS — R778 Other specified abnormalities of plasma proteins: Secondary | ICD-10-CM | POA: Insufficient documentation

## 2019-10-24 DIAGNOSIS — R079 Chest pain, unspecified: Secondary | ICD-10-CM | POA: Diagnosis not present

## 2019-10-24 DIAGNOSIS — Z79899 Other long term (current) drug therapy: Secondary | ICD-10-CM | POA: Insufficient documentation

## 2019-10-24 DIAGNOSIS — R0789 Other chest pain: Secondary | ICD-10-CM | POA: Diagnosis not present

## 2019-10-24 DIAGNOSIS — F1020 Alcohol dependence, uncomplicated: Secondary | ICD-10-CM | POA: Diagnosis present

## 2019-10-24 DIAGNOSIS — R0602 Shortness of breath: Secondary | ICD-10-CM | POA: Diagnosis not present

## 2019-10-24 DIAGNOSIS — I252 Old myocardial infarction: Secondary | ICD-10-CM | POA: Diagnosis present

## 2019-10-24 DIAGNOSIS — I25118 Atherosclerotic heart disease of native coronary artery with other forms of angina pectoris: Secondary | ICD-10-CM

## 2019-10-24 HISTORY — DX: Non-ST elevation (NSTEMI) myocardial infarction: I21.4

## 2019-10-24 HISTORY — DX: Atherosclerotic heart disease of native coronary artery without angina pectoris: I25.10

## 2019-10-24 LAB — CBC
HCT: 42.8 % (ref 39.0–52.0)
Hemoglobin: 14.7 g/dL (ref 13.0–17.0)
MCH: 28.5 pg (ref 26.0–34.0)
MCHC: 34.3 g/dL (ref 30.0–36.0)
MCV: 83.1 fL (ref 80.0–100.0)
Platelets: 331 10*3/uL (ref 150–400)
RBC: 5.15 MIL/uL (ref 4.22–5.81)
RDW: 13.8 % (ref 11.5–15.5)
WBC: 6.6 10*3/uL (ref 4.0–10.5)
nRBC: 0 % (ref 0.0–0.2)

## 2019-10-24 LAB — BASIC METABOLIC PANEL
Anion gap: 8 (ref 5–15)
BUN: <5 mg/dL — ABNORMAL LOW (ref 8–23)
CO2: 23 mmol/L (ref 22–32)
Calcium: 8.9 mg/dL (ref 8.9–10.3)
Chloride: 92 mmol/L — ABNORMAL LOW (ref 98–111)
Creatinine, Ser: 0.62 mg/dL (ref 0.61–1.24)
GFR calc Af Amer: >60 mL/min (ref >60)
GFR calc non Af Amer: >60 mL/min (ref >60)
Glucose, Bld: 121 mg/dL — ABNORMAL HIGH (ref 70–99)
Potassium: 3.8 mmol/L (ref 3.5–5.1)
Sodium: 123 mmol/L — ABNORMAL LOW (ref 135–145)

## 2019-10-24 LAB — TROPONIN I (HIGH SENSITIVITY)
Troponin I (High Sensitivity): 149 ng/L (ref <18)
Troponin I (High Sensitivity): 116 ng/L (ref <18)

## 2019-10-24 LAB — POC SARS CORONAVIRUS 2 AG -  ED: SARS Coronavirus 2 Ag: NEGATIVE

## 2019-10-24 LAB — APTT: aPTT: 36 seconds (ref 24–36)

## 2019-10-24 LAB — PROTIME-INR
INR: 1 (ref 0.8–1.2)
Prothrombin Time: 13 seconds (ref 11.4–15.2)

## 2019-10-24 MED ORDER — NITROGLYCERIN IN D5W 200-5 MCG/ML-% IV SOLN
5.0000 ug/min | INTRAVENOUS | Status: DC
  Administered 2019-10-24: 5 ug/min via INTRAVENOUS
  Filled 2019-10-24: qty 250

## 2019-10-24 MED ORDER — ACETAMINOPHEN 325 MG PO TABS
650.0000 mg | ORAL_TABLET | Freq: Once | ORAL | Status: AC
  Administered 2019-10-24: 650 mg via ORAL
  Filled 2019-10-24: qty 2

## 2019-10-24 MED ORDER — SODIUM CHLORIDE 0.9 % IV BOLUS
1000.0000 mL | Freq: Once | INTRAVENOUS | Status: AC
  Administered 2019-10-24: 1000 mL via INTRAVENOUS

## 2019-10-24 MED ORDER — IOHEXOL 350 MG/ML SOLN
100.0000 mL | Freq: Once | INTRAVENOUS | Status: AC | PRN
  Administered 2019-10-24: 100 mL via INTRAVENOUS

## 2019-10-24 MED ORDER — HEPARIN BOLUS VIA INFUSION
4000.0000 [IU] | Freq: Once | INTRAVENOUS | Status: AC
  Administered 2019-10-24: 4000 [IU] via INTRAVENOUS

## 2019-10-24 MED ORDER — ASPIRIN 81 MG PO CHEW
324.0000 mg | CHEWABLE_TABLET | Freq: Once | ORAL | Status: AC
  Administered 2019-10-24: 324 mg via ORAL
  Filled 2019-10-24: qty 4

## 2019-10-24 MED ORDER — NITROGLYCERIN 0.4 MG SL SUBL
0.4000 mg | SUBLINGUAL_TABLET | Freq: Once | SUBLINGUAL | Status: AC
  Administered 2019-10-24: 0.4 mg via SUBLINGUAL
  Filled 2019-10-24: qty 1

## 2019-10-24 MED ORDER — HEPARIN (PORCINE) 25000 UT/250ML-% IV SOLN
12.0000 [IU]/kg/h | INTRAVENOUS | Status: DC
  Administered 2019-10-24: 12 [IU]/kg/h via INTRAVENOUS
  Filled 2019-10-24: qty 250

## 2019-10-24 NOTE — ED Notes (Signed)
Pt transported to CT Scan

## 2019-10-24 NOTE — Progress Notes (Signed)
ANTICOAGULATION CONSULT NOTE - Initial Consult  Pharmacy Consult for Heparin Infusion Indication: chest pain/ACS  No Known Allergies  Patient Measurements: Height: 6' (182.9 cm) Weight: 155 lb (70.3 kg) IBW/kg (Calculated) : 77.6 Heparin Dosing Weight: 70.3 kg  Vital Signs: Temp: 97.7 F (36.5 C) (03/24 1728) Temp Source: Oral (03/24 1728) BP: 119/74 (03/24 2100) Pulse Rate: 65 (03/24 2100)  Labs: Recent Labs    10/24/19 1743 10/24/19 1939  HGB 14.7  --   HCT 42.8  --   PLT 331  --   APTT 36  --   LABPROT 13.0  --   INR 1.0  --   CREATININE 0.62  --   TROPONINIHS 149* 116*    Estimated Creatinine Clearance: 96.4 mL/min (by C-G formula based on SCr of 0.62 mg/dL).   Medical History: Past Medical History:  Diagnosis Date  . Hypertension 2012    Medications:  Amlodipine 10mg  po daily  Assessment: Pharmacy consulted to dose heparin infusion for chest pain/ACS.  Patient is not on any anticoagulation prior to admission.  Goal of Therapy:  Heparin level 0.3-0.7 units/ml Monitor platelets by anticoagulation protocol: Yes   Plan:  Bolus heparin 4000 units IV x 1 Begin heparin infusion at 12 units/kg/hr (850 units/hr) Obtain a heparin level in 6 hours Monitor daily heparin level, CBC, s/s bleeding  10/24/2019,9:28 PM

## 2019-10-24 NOTE — ED Notes (Signed)
Date and time results received: 03/24/216:53 PM    Test: Troponin Critical Value: 149  Name of Provider Notified: Dr. Estell Harpin  Orders Received? Or Actions Taken? See orders

## 2019-10-24 NOTE — Progress Notes (Signed)
Cardiology Moonlighter Note  Contacted by ED regarding this patient on arrival to [facility]. Although the provider note from PA Henderly states that she spoke with me about this patient earlier this evening, this must be a mistake as I have not been contacted by anyone about this patient. I suspect that the [facility] unassigned cardiology provider (Sunit Tolia listed in [place]) was the one who was contacted about this patient earlier.   [facility] ED provider will reach out to [facility] unassigned cardiology provider to discuss the case. Feel free to contact me at [phone] if there are any issues in contacting the unassigned cardiology provider and I will be happy to see the patient.   Rosario Jacks, MD Cardiology Fellow, PGY-7

## 2019-10-24 NOTE — Discharge Instructions (Signed)
You will need a CT scan of your chest in 6 months to look at the lung nodule to make sure you don't have cancer.

## 2019-10-24 NOTE — ED Notes (Signed)
[facility] present to transport Pt to [facility].

## 2019-10-24 NOTE — ED Triage Notes (Signed)
Patient complains of chest pain with shortness of breath that began last night.

## 2019-10-24 NOTE — ED Notes (Signed)
Date and time results received: 10/24/19 2104   Test: Troponin Critical Value: 116  Name of Provider Notified: Estell Harpin, MD

## 2019-10-24 NOTE — ED Provider Notes (Addendum)
[facility] EMERGENCY DEPARTMENT Provider Note   CSN: [id number] Arrival date & time: 10/24/19  1715   History Chief Complaint  Patient presents with  . Chest Pain   Michael Rivas is a 64 y.o. male with past medical history significant for hypertension, hyperlipidemia, tobacco abuse who presents for evaluation of chest pain.  Patient with left upper chest pain which radiates into his jaw and into his back which started yesterday.  Notes associated nausea and shortness of breath.  No cough, headache, abdominal pain, paresthesias.  No associated diaphoresis or emesis.  Denies any prior cardiac or pulmonary events.  No recent surgeries, immobilization or unilateral leg swelling.  Rates his pain a 9/10.  Describes as pressure and aching.  Denies additional aggravating or alleviating factors.  No cocaine or illicit DU.  Chronic left ear pain, supposed to have surgery however was cancelled due to hyponatremia.  History obtained from patient and past medical records. No interpreter is used.  HPI     Past Medical History:  Diagnosis Date  . Hypertension 2012    Patient Active Problem List   Diagnosis Date Noted  . Essential hypertension, benign 03/02/2016  . Other tobacco product nicotine dependence with nicotine-induced disorder 03/02/2016  . Depression 03/02/2016  . Chronic pain 03/02/2016    Past Surgical History:  Procedure Laterality Date  . BACK SURGERY  2016  . CHOLECYSTECTOMY  2007       Family History  Problem Relation Age of Onset  . Stroke Mother   . Diabetes Mother   . Hypertension Mother   . Heart failure Father   . Heart attack Father   . Diabetes Maternal Aunt   . Heart attack Maternal Aunt   . Stroke Maternal Uncle   . Stroke Paternal Uncle   . Stroke Maternal Uncle   . Heart attack Paternal Uncle     Social History   Tobacco Use  . Smoking status: Current Every Day Smoker    Packs/day: 0.50    Years: 45.00    Pack years: 22.50    Types:  Cigars, Cigarettes  . Smokeless tobacco: Never Used  . Tobacco comment: smokes 2-3 cigars/daily.  former cigarette smoker , quit 2016, 0.5 ppd  Substance Use Topics  . Alcohol use: Yes    Alcohol/week: 35.0 standard drinks    Types: 35 Cans of beer per week    Comment: every other day  . Drug use: No    Home Medications Prior to Admission medications   Medication Sig Start Date End Date Taking? Authorizing Provider  amLODipine (NORVASC) 10 MG tablet Take 10 mg by mouth daily. 10/08/19  Yes [provider]    Allergies    Patient has no known allergies.  Review of Systems   Review of Systems  Constitutional: Negative.   HENT: Negative.   Respiratory: Positive for shortness of breath.   Cardiovascular: Positive for chest pain. Negative for palpitations and leg swelling.  Gastrointestinal: Positive for nausea. Negative for abdominal pain, constipation, diarrhea, rectal pain and vomiting.  Genitourinary: Negative.   Musculoskeletal: Negative.   Neurological: Negative.   All other systems reviewed and are negative.   Physical Exam Updated Vital Signs BP 119/74   Pulse 65   Temp 97.7 F (36.5 C) (Oral)   Resp 12   Ht 6' (1.829 m)   Wt 70.3 kg   SpO2 99%   BMI 21.02 kg/m   Physical Exam Vitals and nursing note reviewed.  Constitutional:  General: He is not in acute distress.    Appearance: He is well-developed. He is not ill-appearing, toxic-appearing or diaphoretic.  HENT:     Head: Atraumatic.  Eyes:     Pupils: Pupils are equal, round, and reactive to light.  Cardiovascular:     Rate and Rhythm: Normal rate and regular rhythm.     Pulses:          Radial pulses are 2+ on the right side and 2+ on the left side.     Heart sounds: Normal heart sounds.  Pulmonary:     Effort: Pulmonary effort is normal. No respiratory distress.     Breath sounds: Normal breath sounds.  Abdominal:     General: Bowel sounds are normal. There is no distension.      Palpations: Abdomen is soft.  Musculoskeletal:        General: Normal range of motion.     Cervical back: Normal range of motion and neck supple.     Right lower leg: No tenderness. No edema.     Left lower leg: No tenderness. No edema.  Skin:    General: Skin is warm and dry.     Capillary Refill: Capillary refill takes less than 2 seconds.  Neurological:     General: No focal deficit present.     Mental Status: He is alert and oriented to person, place, and time.     Comments: Ambulatory without difficulty    ED Results / Procedures / Treatments   Labs (all labs ordered are listed, but only abnormal results are displayed) Labs Reviewed  BASIC METABOLIC PANEL - Abnormal; Notable for the following components:      Result Value   Sodium 123 (*)    Chloride 92 (*)    Glucose, Bld 121 (*)    BUN <5 (*)    All other components within normal limits  TROPONIN I (HIGH SENSITIVITY) - Abnormal; Notable for the following components:   Troponin I (High Sensitivity) 149 (*)    All other components within normal limits  TROPONIN I (HIGH SENSITIVITY) - Abnormal; Notable for the following components:   Troponin I (High Sensitivity) 116 (*)    All other components within normal limits  CBC  PROTIME-INR  APTT  POC SARS CORONAVIRUS 2 AG -  ED    EKG EKG Interpretation  Date/Time:  Wednesday October 24 2019 17:26:07 EDT Ventricular Rate:  79 PR Interval:  144 QRS Duration: 82 QT Interval:  370 QTC Calculation: 424 R Axis:   96 Text Interpretation: Normal sinus rhythm Biatrial enlargement Rightward axis Anteroseptal infarct , age undetermined Abnormal ECG Confirmed by Milton Ferguson [phone]) on 10/24/2019 7:00:14 PM Also confirmed by Milton Ferguson [phone])  on 10/24/2019 7:21:24 PM   Radiology DG Chest 2 View  Result Date: 10/24/2019 CLINICAL DATA:  Chest pain, shortness of breath since last evening EXAM: CHEST - 2 VIEW COMPARISON:  12/21/2018 FINDINGS: Frontal and lateral views of the  chest demonstrate an unremarkable cardiac silhouette. The lungs are hyperinflated with background emphysema unchanged. No airspace disease, effusion, or pneumothorax. No acute bony abnormalities. IMPRESSION: 1. Background emphysema.  No acute process. Electronically Signed   By: Randa Ngo M.D.   On: 10/24/2019 17:54    CTA Chest, Abd, Pelvis  IMPRESSION: 1. No acute intrathoracic, abdominal, or pelvic pathology. No aortic aneurysm or dissection. No pulmonary artery embolus. 2. A 6 mm right lower lobe pulmonary nodule. Non-contrast chest CT at 6-12 months is recommended.  If the nodule is stable at time of repeat CT, then future CT at 18-24 months (from today's scan) is considered optional for low-risk patients, but is recommended for high-risk patients. This recommendation follows the consensus statement: Guidelines for Management of Incidental Pulmonary Nodules Detected on CT Images: From the Fleischner Society 2017; Radiology 2017; 284:228-243. 3. Aortic Atherosclerosis (ICD10-I70.0) and Emphysema (ICD10-J43.9).   Electronically Signed By: Elgie Collard M.D. On: 10/24/2019 20:24   Procedures .Critical Care Performed by: Linwood Dibbles, PA-C Authorized by: Linwood Dibbles, PA-C   Critical care provider statement:    Critical care time (minutes):  45   Critical care was necessary to treat or prevent imminent or life-threatening deterioration of the following conditions:  Cardiac failure   Critical care was time spent personally by me on the following activities:  Discussions with consultants, evaluation of patient's response to treatment, examination of patient, ordering and performing treatments and interventions, ordering and review of laboratory studies, ordering and review of radiographic studies, pulse oximetry, re-evaluation of patient's condition, obtaining history from patient or surrogate and review of old charts   (including critical care time)  Medications  Ordered in ED Medications  nitroGLYCERIN 50 mg in dextrose 5 % 250 mL (0.2 mg/mL) infusion (5 mcg/min Intravenous New Bag/Given 10/24/19 2054)  acetaminophen (TYLENOL) tablet 650 mg (has no administration in time range)  heparin bolus via infusion 4,000 Units (has no administration in time range)  heparin ADULT infusion 100 units/mL (25000 units/257mL sodium chloride 0.45%) (has no administration in time range)  sodium chloride 0.9 % bolus 1,000 mL (0 mLs Intravenous Stopped 10/24/19 2055)  nitroGLYCERIN (NITROSTAT) SL tablet 0.4 mg (0.4 mg Sublingual Given 10/24/19 1920)  aspirin chewable tablet 324 mg (324 mg Oral Given 10/24/19 1919)  iohexol (OMNIPAQUE) 350 MG/ML injection 100 mL (100 mLs Intravenous Contrast Given 10/24/19 1944)   ED Course  I have reviewed the triage vital signs and the nursing notes.  Pertinent labs & imaging results that were available during my care of the patient were reviewed by me and considered in my medical decision making (see chart for details).  64 year old male presents for evaluation of chest pain, shortness of breath.  He is afebrile, nonseptic, not ill-appearing.  Pain nonpleuritic in nature however is mildly exertional.  Radiates to back as well as left arm.  Has some lightheadedness without dizziness.  Neurovascularly intact.  Labs obtained from triage. I have contacted CT department. Patient will be next.  Labs and imaging personally reviewed and assessed: CBC without leukocytosis Metabolic panel with hyponatremia to 123, mild hyperglycemia to 121 Troponin 149 DG chest without cardiomegaly, pulmonary edema, pneumothorax, infiltrates. Emphysema EKG without STEMI. CTA dissection negative however with 10mm lower pulmonary nodule. Patient will follow up outpatient for this.  Heart score 7, Wells criteria low risk. Will need admission. Started on Heparin infusion and Nitro drip for ACS.  2115: CONSULT with Dr. Michele Rockers with Cardiology who recommends tx to [facility] ED  to ED. Unsure if Cards to amit vs Hospitalist at this time. States he would like to evaluate patient before deciding admitting team. Request page on arrival to ED. Discussed with Dr. Fredderick Phenix at [facility] ED who agrees to accept patient in transfer.  The patient appears reasonably stabilized for admission considering the current resources, flow, and capabilities available in the ED at this time, and I doubt any other [facility] requiring further screening and/or treatment in the ED prior to admission.   ADDEND: Apparently when I had spoken  with [facility] they stated I had spoken with Dr Dell Ponto with Cardiology. This was actually Dr. Michele Rockers with Cardiology who had the above recomendations. Chart addended to reflect correct Consultants name, Dr. Michele Rockers with [facility] Card[facility].     MDM Rules/Calculators/A&P                       Final Clinical Impression(s) / ED Diagnoses Final diagnoses:  Chest pain due to myocardial ischemia, unspecified ischemic chest pain type  Elevated troponin  Hyponatremia    Rx / DC Orders ED Discharge Orders    None       Micaiah Litle A, PA-C 10/24/19 2124    Sadie Hazelett A, PA-C 10/25/19 0832    Ladan Vanderzanden A, PA-C 10/25/19 9147    Bethann Berkshire, MD 10/29/19 [phone]

## 2019-10-24 NOTE — ED Provider Notes (Signed)
I assumed care of patient after transfer from [facility].  He is awake alert this time.  He reports feeling improved. He is in no acute distress.  Reports chest pain is improved.  He does have mild diffuse abdominal tenderness.  He has had extensive work-up done already.  He was noted to have pulmonary nodule which will need to be followed up in 6 months.  Will consult cardiology.   EKG Interpretation  Date/Time:  Wednesday October 24 2019 23:12:35 EDT Ventricular Rate:  66 PR Interval:  148 QRS Duration: 80 QT Interval:  416 QTC Calculation: 436 R Axis:   84 Text Interpretation: Normal sinus rhythm Septal infarct , age undetermined Abnormal ECG Confirmed by Zadie Rhine ([id number]) on 10/24/2019 11:16:34 PM         Zadie Rhine, MD 10/24/19 2330

## 2019-10-25 ENCOUNTER — Observation Stay (HOSPITAL_COMMUNITY): Payer: PPO

## 2019-10-25 ENCOUNTER — Encounter (HOSPITAL_COMMUNITY)
Admission: EM | Disposition: A | Discharge status: Home / Self Care | Payer: Self-pay | Source: Home / Self Care | Attending: Emergency Medicine

## 2019-10-25 ENCOUNTER — Encounter (HOSPITAL_COMMUNITY): Payer: Self-pay | Admitting: Family Medicine

## 2019-10-25 ENCOUNTER — Observation Stay (HOSPITAL_BASED_OUTPATIENT_CLINIC_OR_DEPARTMENT_OTHER): Payer: PPO

## 2019-10-25 ENCOUNTER — Other Ambulatory Visit: Payer: Self-pay

## 2019-10-25 DIAGNOSIS — I252 Old myocardial infarction: Secondary | ICD-10-CM | POA: Diagnosis present

## 2019-10-25 DIAGNOSIS — F1029 Alcohol dependence with unspecified alcohol-induced disorder: Secondary | ICD-10-CM

## 2019-10-25 DIAGNOSIS — R778 Other specified abnormalities of plasma proteins: Secondary | ICD-10-CM | POA: Insufficient documentation

## 2019-10-25 DIAGNOSIS — I209 Angina pectoris, unspecified: Secondary | ICD-10-CM | POA: Insufficient documentation

## 2019-10-25 DIAGNOSIS — I2510 Atherosclerotic heart disease of native coronary artery without angina pectoris: Secondary | ICD-10-CM | POA: Diagnosis not present

## 2019-10-25 DIAGNOSIS — Z955 Presence of coronary angioplasty implant and graft: Secondary | ICD-10-CM | POA: Diagnosis not present

## 2019-10-25 DIAGNOSIS — I10 Essential (primary) hypertension: Secondary | ICD-10-CM | POA: Diagnosis not present

## 2019-10-25 DIAGNOSIS — R7303 Prediabetes: Secondary | ICD-10-CM | POA: Diagnosis not present

## 2019-10-25 DIAGNOSIS — F10229 Alcohol dependence with intoxication, unspecified: Secondary | ICD-10-CM | POA: Diagnosis not present

## 2019-10-25 DIAGNOSIS — I214 Non-ST elevation (NSTEMI) myocardial infarction: Secondary | ICD-10-CM

## 2019-10-25 DIAGNOSIS — R911 Solitary pulmonary nodule: Secondary | ICD-10-CM | POA: Diagnosis not present

## 2019-10-25 DIAGNOSIS — Z9861 Coronary angioplasty status: Secondary | ICD-10-CM | POA: Diagnosis not present

## 2019-10-25 DIAGNOSIS — I259 Chronic ischemic heart disease, unspecified: Secondary | ICD-10-CM | POA: Diagnosis not present

## 2019-10-25 DIAGNOSIS — E871 Hypo-osmolality and hyponatremia: Secondary | ICD-10-CM | POA: Diagnosis not present

## 2019-10-25 DIAGNOSIS — I219 Acute myocardial infarction, unspecified: Secondary | ICD-10-CM

## 2019-10-25 DIAGNOSIS — F17210 Nicotine dependence, cigarettes, uncomplicated: Secondary | ICD-10-CM | POA: Diagnosis not present

## 2019-10-25 DIAGNOSIS — Z20822 Contact with and (suspected) exposure to covid-19: Secondary | ICD-10-CM | POA: Diagnosis not present

## 2019-10-25 DIAGNOSIS — R739 Hyperglycemia, unspecified: Secondary | ICD-10-CM | POA: Diagnosis not present

## 2019-10-25 DIAGNOSIS — F1020 Alcohol dependence, uncomplicated: Secondary | ICD-10-CM | POA: Diagnosis present

## 2019-10-25 HISTORY — PX: INTRAVASCULAR ULTRASOUND/IVUS: CATH118244

## 2019-10-25 HISTORY — DX: Acute myocardial infarction, unspecified: I21.9

## 2019-10-25 HISTORY — PX: CORONARY ANGIOPLASTY: SHX604

## 2019-10-25 HISTORY — PX: INTRAVASCULAR PRESSURE WIRE/FFR STUDY: CATH118243

## 2019-10-25 HISTORY — PX: CORONARY BALLOON ANGIOPLASTY: CATH118233

## 2019-10-25 HISTORY — PX: LEFT HEART CATH AND CORONARY ANGIOGRAPHY: CATH118249

## 2019-10-25 LAB — TSH: TSH: 2.763 u[IU]/mL (ref 0.350–4.500)

## 2019-10-25 LAB — POCT ACTIVATED CLOTTING TIME
Activated Clotting Time: 285 seconds
Activated Clotting Time: 356 seconds
Activated Clotting Time: 472 seconds
Activated Clotting Time: 466 seconds

## 2019-10-25 LAB — COMPREHENSIVE METABOLIC PANEL
ALT: 12 U/L (ref 0–44)
AST: 18 U/L (ref 15–41)
Albumin: 3 g/dL — ABNORMAL LOW (ref 3.5–5.0)
Alkaline Phosphatase: 50 U/L (ref 38–126)
Anion gap: 10 (ref 5–15)
BUN: <5 mg/dL — ABNORMAL LOW (ref 8–23)
CO2: 20 mmol/L — ABNORMAL LOW (ref 22–32)
Calcium: 8.8 mg/dL — ABNORMAL LOW (ref 8.9–10.3)
Chloride: 97 mmol/L — ABNORMAL LOW (ref 98–111)
Creatinine, Ser: 0.7 mg/dL (ref 0.61–1.24)
GFR calc Af Amer: >60 mL/min (ref >60)
GFR calc non Af Amer: >60 mL/min (ref >60)
Glucose, Bld: 107 mg/dL — ABNORMAL HIGH (ref 70–99)
Potassium: 4.2 mmol/L (ref 3.5–5.1)
Sodium: 127 mmol/L — ABNORMAL LOW (ref 135–145)
Total Bilirubin: 0.6 mg/dL (ref 0.3–1.2)
Total Protein: 7.1 g/dL (ref 6.5–8.1)

## 2019-10-25 LAB — SODIUM, URINE, RANDOM: Sodium, Ur: 28 mmol/L

## 2019-10-25 LAB — OSMOLALITY, URINE: Osmolality, Ur: 266 mOsm/kg — ABNORMAL LOW (ref 300–900)

## 2019-10-25 LAB — LIPID PANEL
Cholesterol: 140 mg/dL (ref 0–200)
HDL: 49 mg/dL (ref >40)
LDL Cholesterol: 81 mg/dL (ref 0–99)
Total CHOL/HDL Ratio: 2.9 RATIO
Triglycerides: 50 mg/dL (ref <150)
VLDL: 10 mg/dL (ref 0–40)

## 2019-10-25 LAB — CBC
HCT: 39.8 % (ref 39.0–52.0)
Hemoglobin: 13.6 g/dL (ref 13.0–17.0)
MCH: 28.4 pg (ref 26.0–34.0)
MCHC: 34.2 g/dL (ref 30.0–36.0)
MCV: 83.1 fL (ref 80.0–100.0)
Platelets: 324 10*3/uL (ref 150–400)
RBC: 4.79 MIL/uL (ref 4.22–5.81)
RDW: 13.8 % (ref 11.5–15.5)
WBC: 6 10*3/uL (ref 4.0–10.5)
nRBC: 0 % (ref 0.0–0.2)

## 2019-10-25 LAB — HEMOGLOBIN A1C
Hgb A1c MFr Bld: 6.3 % — ABNORMAL HIGH (ref 4.8–5.6)
Mean Plasma Glucose: 134.11 mg/dL

## 2019-10-25 LAB — PHOSPHORUS: Phosphorus: 3.4 mg/dL (ref 2.5–4.6)

## 2019-10-25 LAB — RESPIRATORY PANEL BY RT PCR (FLU A&B, COVID)
Influenza A by PCR: NEGATIVE
Influenza B by PCR: NEGATIVE
SARS Coronavirus 2 by RT PCR: NEGATIVE

## 2019-10-25 LAB — ECHOCARDIOGRAM COMPLETE
Height: 72 in
Weight: 2480 oz

## 2019-10-25 LAB — MAGNESIUM: Magnesium: 1.8 mg/dL (ref 1.7–2.4)

## 2019-10-25 LAB — HIV ANTIBODY (ROUTINE TESTING W REFLEX): HIV Screen 4th Generation wRfx: NONREACTIVE

## 2019-10-25 LAB — HEPARIN LEVEL (UNFRACTIONATED): Heparin Unfractionated: 0.25 IU/mL — ABNORMAL LOW (ref 0.30–0.70)

## 2019-10-25 SURGERY — LEFT HEART CATH AND CORONARY ANGIOGRAPHY
Anesthesia: LOCAL

## 2019-10-25 MED ORDER — AMLODIPINE BESYLATE 10 MG PO TABS
10.0000 mg | ORAL_TABLET | Freq: Every day | ORAL | Status: DC
  Administered 2019-10-25: 10 mg via ORAL
  Filled 2019-10-25: qty 2

## 2019-10-25 MED ORDER — MIDAZOLAM HCL 2 MG/2ML IJ SOLN
INTRAMUSCULAR | Status: AC
  Filled 2019-10-25: qty 2

## 2019-10-25 MED ORDER — NITROGLYCERIN 1 MG/10 ML FOR IR/CATH LAB
INTRA_ARTERIAL | Status: DC | PRN
  Administered 2019-10-25 (×3): 200 ug via INTRACORONARY

## 2019-10-25 MED ORDER — ONDANSETRON HCL 4 MG/2ML IJ SOLN: 4.0000 mg | Freq: Four times a day (QID) | INTRAMUSCULAR | Status: DC | PRN

## 2019-10-25 MED ORDER — FAMOTIDINE IN NACL 20-0.9 MG/50ML-% IV SOLN
INTRAVENOUS | Status: AC
  Filled 2019-10-25: qty 50

## 2019-10-25 MED ORDER — VERAPAMIL HCL 2.5 MG/ML IV SOLN
INTRAVENOUS | Status: DC | PRN
  Administered 2019-10-25: 10 mL via INTRA_ARTERIAL

## 2019-10-25 MED ORDER — HEPARIN SODIUM (PORCINE) 1000 UNIT/ML IJ SOLN
INTRAMUSCULAR | Status: AC
  Filled 2019-10-25: qty 1

## 2019-10-25 MED ORDER — HEPARIN SODIUM (PORCINE) 1000 UNIT/ML IJ SOLN
INTRAMUSCULAR | Status: DC | PRN
  Administered 2019-10-25: 3000 [IU] via INTRAVENOUS
  Administered 2019-10-25: 2000 [IU] via INTRAVENOUS
  Administered 2019-10-25 (×2): 4000 [IU] via INTRAVENOUS
  Administered 2019-10-25: 3000 [IU] via INTRAVENOUS

## 2019-10-25 MED ORDER — HEPARIN (PORCINE) IN NACL 1000-0.9 UT/500ML-% IV SOLN
INTRAVENOUS | Status: AC
  Filled 2019-10-25: qty 500

## 2019-10-25 MED ORDER — VERAPAMIL HCL 2.5 MG/ML IV SOLN
INTRAVENOUS | Status: AC
  Filled 2019-10-25: qty 2

## 2019-10-25 MED ORDER — ACETAMINOPHEN 325 MG PO TABS
650.0000 mg | ORAL_TABLET | ORAL | Status: DC | PRN
  Administered 2019-10-25: 650 mg via ORAL
  Filled 2019-10-25: qty 2

## 2019-10-25 MED ORDER — LORAZEPAM 2 MG/ML IJ SOLN: 1.0000 mg | INTRAMUSCULAR | Status: DC | PRN

## 2019-10-25 MED ORDER — FENTANYL CITRATE (PF) 100 MCG/2ML IJ SOLN
INTRAMUSCULAR | Status: AC
  Filled 2019-10-25: qty 2

## 2019-10-25 MED ORDER — PANTOPRAZOLE SODIUM 40 MG PO TBEC
40.0000 mg | DELAYED_RELEASE_TABLET | Freq: Every day | ORAL | Status: DC
  Administered 2019-10-26: 40 mg via ORAL
  Filled 2019-10-25 (×2): qty 1

## 2019-10-25 MED ORDER — LORAZEPAM 1 MG PO TABS: 0.0000 mg | ORAL_TABLET | Freq: Four times a day (QID) | ORAL | Status: DC

## 2019-10-25 MED ORDER — SODIUM CHLORIDE 0.9% FLUSH: 3.0000 mL | INTRAVENOUS | Status: DC | PRN

## 2019-10-25 MED ORDER — FAMOTIDINE IN NACL 20-0.9 MG/50ML-% IV SOLN
INTRAVENOUS | Status: AC | PRN
  Administered 2019-10-25: 20 mg via INTRAVENOUS

## 2019-10-25 MED ORDER — ONDANSETRON HCL 4 MG/2ML IJ SOLN
INTRAMUSCULAR | Status: DC | PRN
  Administered 2019-10-25: 4 mg via INTRAVENOUS

## 2019-10-25 MED ORDER — LORAZEPAM 1 MG PO TABS: 0.0000 mg | ORAL_TABLET | Freq: Two times a day (BID) | ORAL | Status: DC

## 2019-10-25 MED ORDER — ONDANSETRON HCL 4 MG/2ML IJ SOLN
INTRAMUSCULAR | Status: AC
  Filled 2019-10-25: qty 2

## 2019-10-25 MED ORDER — FENTANYL CITRATE (PF) 100 MCG/2ML IJ SOLN
INTRAMUSCULAR | Status: DC | PRN
  Administered 2019-10-25 (×2): 25 ug via INTRAVENOUS
  Administered 2019-10-25: 50 ug via INTRAVENOUS

## 2019-10-25 MED ORDER — SODIUM CHLORIDE 0.9 % WEIGHT BASED INFUSION
3.0000 mL/kg/h | INTRAVENOUS | Status: AC
  Administered 2019-10-25: 3 mL/kg/h via INTRAVENOUS

## 2019-10-25 MED ORDER — LABETALOL HCL 5 MG/ML IV SOLN: 10.0000 mg | INTRAVENOUS | Status: AC | PRN

## 2019-10-25 MED ORDER — TICAGRELOR 90 MG PO TABS
ORAL_TABLET | ORAL | Status: AC
  Filled 2019-10-25: qty 1

## 2019-10-25 MED ORDER — HEPARIN (PORCINE) IN NACL 1000-0.9 UT/500ML-% IV SOLN
INTRAVENOUS | Status: DC | PRN
  Administered 2019-10-25 (×3): 500 mL

## 2019-10-25 MED ORDER — HEPARIN (PORCINE) 25000 UT/250ML-% IV SOLN: 1000.0000 [IU]/h | INTRAVENOUS | Status: DC

## 2019-10-25 MED ORDER — ASPIRIN EC 81 MG PO TBEC
81.0000 mg | DELAYED_RELEASE_TABLET | Freq: Every day | ORAL | Status: DC
  Administered 2019-10-25 – 2019-10-26 (×2): 81 mg via ORAL
  Filled 2019-10-25 (×2): qty 1

## 2019-10-25 MED ORDER — THIAMINE HCL 100 MG/ML IJ SOLN: 100.0000 mg | Freq: Every day | INTRAMUSCULAR | Status: DC

## 2019-10-25 MED ORDER — HYDRALAZINE HCL 20 MG/ML IJ SOLN: 10.0000 mg | INTRAMUSCULAR | Status: AC | PRN

## 2019-10-25 MED ORDER — LIDOCAINE HCL (PF) 1 % IJ SOLN
INTRAMUSCULAR | Status: AC
  Filled 2019-10-25: qty 30

## 2019-10-25 MED ORDER — MIDAZOLAM HCL 2 MG/2ML IJ SOLN
INTRAMUSCULAR | Status: DC | PRN
  Administered 2019-10-25 (×3): 1 mg via INTRAVENOUS

## 2019-10-25 MED ORDER — ATORVASTATIN CALCIUM 80 MG PO TABS
80.0000 mg | ORAL_TABLET | Freq: Every day | ORAL | Status: DC
  Administered 2019-10-25: 80 mg via ORAL
  Filled 2019-10-25: qty 1

## 2019-10-25 MED ORDER — ADULT MULTIVITAMIN W/MINERALS CH
1.0000 | ORAL_TABLET | Freq: Every day | ORAL | Status: DC
  Administered 2019-10-25 – 2019-10-26 (×2): 1 via ORAL
  Filled 2019-10-25 (×2): qty 1

## 2019-10-25 MED ORDER — TICAGRELOR 90 MG PO TABS
90.0000 mg | ORAL_TABLET | Freq: Two times a day (BID) | ORAL | Status: DC
  Administered 2019-10-25 – 2019-10-26 (×2): 90 mg via ORAL
  Filled 2019-10-25 (×2): qty 1

## 2019-10-25 MED ORDER — SODIUM CHLORIDE 0.9% FLUSH
3.0000 mL | Freq: Two times a day (BID) | INTRAVENOUS | Status: DC
  Administered 2019-10-25: 3 mL via INTRAVENOUS

## 2019-10-25 MED ORDER — SODIUM CHLORIDE 0.9 % IV SOLN: INTRAVENOUS | Status: AC

## 2019-10-25 MED ORDER — IOHEXOL 350 MG/ML SOLN
INTRAVENOUS | Status: DC | PRN
  Administered 2019-10-25: 120 mL

## 2019-10-25 MED ORDER — TICAGRELOR 90 MG PO TABS
ORAL_TABLET | ORAL | Status: DC | PRN
  Administered 2019-10-25: 180 mg via ORAL

## 2019-10-25 MED ORDER — LIDOCAINE HCL (PF) 1 % IJ SOLN
INTRAMUSCULAR | Status: DC | PRN
  Administered 2019-10-25: 2 mL

## 2019-10-25 MED ORDER — LORAZEPAM 1 MG PO TABS: 1.0000 mg | ORAL_TABLET | ORAL | Status: DC | PRN

## 2019-10-25 MED ORDER — ASPIRIN EC 81 MG PO TBEC: 81.0000 mg | DELAYED_RELEASE_TABLET | Freq: Every day | ORAL | Status: DC

## 2019-10-25 MED ORDER — FOLIC ACID 1 MG PO TABS
1.0000 mg | ORAL_TABLET | Freq: Every day | ORAL | Status: DC
  Administered 2019-10-25 – 2019-10-26 (×2): 1 mg via ORAL
  Filled 2019-10-25 (×2): qty 1

## 2019-10-25 MED ORDER — ENSURE ENLIVE PO LIQD
237.0000 mL | Freq: Two times a day (BID) | ORAL | Status: DC
  Administered 2019-10-26: 237 mL via ORAL

## 2019-10-25 MED ORDER — SODIUM CHLORIDE 0.9 % IV SOLN: 250.0000 mL | INTRAVENOUS | Status: DC | PRN

## 2019-10-25 MED ORDER — SODIUM CHLORIDE 0.9 % WEIGHT BASED INFUSION: 1.0000 mL/kg/h | INTRAVENOUS | Status: DC

## 2019-10-25 MED ORDER — THIAMINE HCL 100 MG PO TABS
100.0000 mg | ORAL_TABLET | Freq: Every day | ORAL | Status: DC
  Administered 2019-10-25 – 2019-10-26 (×2): 100 mg via ORAL
  Filled 2019-10-25 (×2): qty 1

## 2019-10-25 SURGICAL SUPPLY — 31 items
BALLN EMERGE MR 2.5X15 (BALLOONS) ×2
BALLN SAPPHIRE 2.0X15 (BALLOONS) ×2
BALLN WOLVERINE 2.50X10 (BALLOONS) ×4
BALLN WOLVERINE 2.50X6 (BALLOONS) ×2
BALLOON EMERGE MR 2.5X15 (BALLOONS) IMPLANT
BALLOON SAPPHIRE 2.0X15 (BALLOONS) IMPLANT
BALLOON WOLVERINE 2.50X10 (BALLOONS) IMPLANT
BALLOON WOLVERINE 2.50X6 (BALLOONS) IMPLANT
CATH LAUNCHER 6FR EBU3.5 (CATHETERS) ×1 IMPLANT
CATH MAMBA FLEX 135 (CATHETERS) ×1 IMPLANT
CATH OPTICROSS HD (CATHETERS) ×1 IMPLANT
CATH OPTITORQUE TIG 4.0 5F (CATHETERS) ×1 IMPLANT
CATH TELESCOPE 6F GEC (CATHETERS) ×1 IMPLANT
CATH TRAPPER 6-8F (CATHETERS) ×1 IMPLANT
DEVICE RAD COMP TR BAND LRG (VASCULAR PRODUCTS) ×1 IMPLANT
ELECT DEFIB PAD ADLT CADENCE (PAD) ×1 IMPLANT
GLIDESHEATH SLEND A-KIT 6F 22G (SHEATH) ×1 IMPLANT
GUIDEWIRE INQWIRE 1.5J.035X260 (WIRE) IMPLANT
GUIDEWIRE PRESSURE COMET II (WIRE) ×1 IMPLANT
INQWIRE 1.5J .035X260CM (WIRE) ×2
KIT ENCORE 26 ADVANTAGE (KITS) ×1 IMPLANT
KIT ESSENTIALS PG (KITS) ×1 IMPLANT
KIT HEART LEFT (KITS) ×2 IMPLANT
KIT HEMO VALVE WATCHDOG (MISCELLANEOUS) ×1 IMPLANT
PACK CARDIAC CATHETERIZATION (CUSTOM PROCEDURE TRAY) ×2 IMPLANT
SLED PULL BACK IVUS (MISCELLANEOUS) ×1 IMPLANT
TRANSDUCER W/STOPCOCK (MISCELLANEOUS) ×2 IMPLANT
TUBING CIL FLEX 10 FLL-RA (TUBING) ×2 IMPLANT
WIRE ASAHI GRAND SLAM 180CM (WIRE) ×1 IMPLANT
WIRE ASAHI PROWATER 180CM (WIRE) ×1 IMPLANT
WIRE HI TORQ BMW 190CM (WIRE) ×1 IMPLANT

## 2019-10-25 NOTE — Progress Notes (Signed)
  Echocardiogram 2D Echocardiogram has been performed.  Michael Rivas 10/25/2019, 4:39 PM

## 2019-10-25 NOTE — H&P (Signed)
History and Physical    Erasmo Vertz WVP:[id number] DOB: 1956/02/22 DOA: 10/24/2019  PCP: Celene Squibb, MD   Patient coming from: Home   Chief Complaint: Chest pain   HPI: Tysheem Accardo is a 64 y.o. male with medical history significant for hypertension and alcohol dependence, now presenting to the emergency department for evaluation of chest pain.  Patient reports that he had been in his usual state of health until last night when he was watching TV, eating a hot dog, and drinking a beer, when he developed a vague chest discomfort with radiation to his jaw, nausea, and shortness of breath.  Symptoms resolved without any intervention, but then returned today and have been persistent.  He denies any associated cough, leg swelling, or leg tenderness.  No recent fevers or chills.  [facility] ED Course: Upon arrival to the ED, patient is found to be afebrile, saturating well on room air, and with stable blood pressure.  EKG features sinus rhythm with biatrial enlargement and nonspecific ST-T abnormality.  Chest x-ray with emphysema but no acute findings.  CTA chest/abdomen/pelvis was negative for acute findings and specifically negative for PE or aortic aneurysm or dissection.  Chemistry panel with sodium 123.  CBC unremarkable.  High-sensitivity troponin elevated to 149, then down to 116.  Patient was given a liter of normal saline, 3 and 24 mg aspirin, and started on IV heparin and nitroglycerin infusions in the ED.  Cardiology was consulted by the ED physician and recommended transfer to [facility] for evaluation.  Cardiology was reconsulted from the [facility] emergency department and recommended a medical admission.  Review of Systems:  All other systems reviewed and apart from HPI, are negative.  Past Medical History:  Diagnosis Date  . Hypertension 2012    Past Surgical History:  Procedure Laterality Date  . BACK SURGERY  2016  . CHOLECYSTECTOMY  2007     reports that he has been  smoking cigars and cigarettes. He has a 22.50 pack-year smoking history. He has never used smokeless tobacco. He reports current alcohol use of about 35.0 standard drinks of alcohol per week. He reports that he does not use drugs.  No Known Allergies  Family History  Problem Relation Age of Onset  . Stroke Mother   . Diabetes Mother   . Hypertension Mother   . Heart failure Father   . Heart attack Father   . Diabetes Maternal Aunt   . Heart attack Maternal Aunt   . Stroke Maternal Uncle   . Stroke Paternal Uncle   . Stroke Maternal Uncle   . Heart attack Paternal Uncle      Prior to Admission medications   Medication Sig Start Date End Date Taking? Authorizing Provider  amLODipine (NORVASC) 10 MG tablet Take 10 mg by mouth daily. 10/08/19  Yes [provider]    Physical Exam: Vitals:   10/25/19 0145 10/25/19 0200 10/25/19 0215 10/25/19 0230  BP: 128/76 110/64 120/74 127/78  Pulse: 64 71 62 61  Resp: 17 19 13 12   Temp:      TempSrc:      SpO2: 97% 98% 96% 97%  Weight:      Height:        Constitutional: NAD, calm  Eyes: PERTLA, lids and conjunctivae normal ENMT: Mucous membranes are moist. Posterior pharynx clear of any exudate or lesions.   Neck: normal, supple, no masses, no thyromegaly Respiratory:  no wheezing, no crackles. No accessory muscle use.  Cardiovascular: S1 & S2 heard, regular rate and rhythm. No extremity edema.  Abdomen: No distension, no tenderness, soft. Bowel sounds active.  Musculoskeletal: no clubbing / cyanosis. No joint deformity upper and lower extremities.   Skin: no significant rashes, lesions, ulcers. Warm, dry, well-perfused. Neurologic: No facial asymmetry. Sensation intact. Moving all extremities.  Psychiatric: Alert and oriented to person, place, and situation. Pleasant and cooperative.    Labs and Imaging on Admission: I have personally reviewed following labs and imaging studies  CBC: Recent Labs  Lab 10/24/19 1743  WBC  6.6  HGB 14.7  HCT 42.8  MCV 83.1  PLT 331   Basic Metabolic Panel: Recent Labs  Lab 10/24/19 1743  NA 123*  K 3.8  CL 92*  CO2 23  GLUCOSE 121*  BUN <5*  CREATININE 0.62  CALCIUM 8.9   GFR: Estimated Creatinine Clearance: 96.4 mL/min (by C-G formula based on SCr of 0.62 mg/dL). Liver Function Tests: No results for input(s): AST, ALT, ALKPHOS, BILITOT, PROT, ALBUMIN in the last 168 hours. No results for input(s): LIPASE, AMYLASE in the last 168 hours. No results for input(s): AMMONIA in the last 168 hours. Coagulation Profile: Recent Labs  Lab 10/24/19 1743  INR 1.0   Cardiac Enzymes: No results for input(s): CKTOTAL, CKMB, CKMBINDEX, TROPONINI in the last 168 hours. BNP (last 3 results) No results for input(s): PROBNP in the last 8760 hours. HbA1C: No results for input(s): HGBA1C in the last 72 hours. CBG: No results for input(s): GLUCAP in the last 168 hours. Lipid Profile: No results for input(s): CHOL, HDL, LDLCALC, TRIG, CHOLHDL, LDLDIRECT in the last 72 hours. Thyroid Function Tests: No results for input(s): TSH, T4TOTAL, FREET4, T3FREE, THYROIDAB in the last 72 hours. Anemia Panel: No results for input(s): VITAMINB12, FOLATE, FERRITIN, TIBC, IRON, RETICCTPCT in the last 72 hours. Urine analysis:    Component Value Date/Time   COLORURINE YELLOW 11/05/2014 1025   APPEARANCEUR CLEAR 11/05/2014 1025   LABSPEC 1.010 11/05/2014 1025   PHURINE 5.5 11/05/2014 1025   GLUCOSEU NEGATIVE 11/05/2014 1025   HGBUR TRACE (A) 11/05/2014 1025   BILIRUBINUR NEGATIVE 11/05/2014 1025   KETONESUR NEGATIVE 11/05/2014 1025   PROTEINUR TRACE (A) 11/05/2014 1025   UROBILINOGEN 0.2 11/05/2014 1025   NITRITE NEGATIVE 11/05/2014 1025   LEUKOCYTESUR TRACE (A) 11/05/2014 1025   Sepsis Labs: @LABRCNTIP (procalcitonin:4,lacticidven:4) )No results found for this or any previous visit (from the past 240 hour(s)).   Radiological Exams on Admission: DG Chest 2 View  Result Date:  10/24/2019 CLINICAL DATA:  Chest pain, shortness of breath since last evening EXAM: CHEST - 2 VIEW COMPARISON:  12/21/2018 FINDINGS: Frontal and lateral views of the chest demonstrate an unremarkable cardiac silhouette. The lungs are hyperinflated with background emphysema unchanged. No airspace disease, effusion, or pneumothorax. No acute bony abnormalities. IMPRESSION: 1. Background emphysema.  No acute process. Electronically Signed   By: 12/23/2018 M.D.   On: 10/24/2019 17:54   CT Angio Chest/Abd/Pel for Dissection W and/or Wo Contrast  Result Date: 10/24/2019 CLINICAL DATA:  64 year old male with chest pain and shortness of breath. EXAM: CT ANGIOGRAPHY CHEST, ABDOMEN AND PELVIS TECHNIQUE: Multidetector CT imaging through the chest, abdomen and pelvis was performed using the standard protocol during bolus administration of intravenous contrast. Multiplanar reconstructed images and MIPs were obtained and reviewed to evaluate the vascular anatomy. CONTRAST:  77 OMNIPAQUE IOHEXOL 350 MG/ML SOLN COMPARISON:  CT abdomen pelvis dated 10/14/2013 and chest radiograph dated 10/24/2019. FINDINGS: CTA CHEST FINDINGS Cardiovascular: There  is no cardiomegaly or pericardial effusion. Coronary vascular calcification primarily involving RCA and left circumflex artery. Minimal atherosclerotic calcification of the thoracic aorta. No aneurysmal dilatation or dissection. The origins of the great vessels of the aortic arch appear patent. No pulmonary artery embolus identified. Mediastinum/Nodes: No hilar or mediastinal adenopathy. The esophagus and the thyroid gland are grossly unremarkable. No mediastinal fluid collection. Lungs/Pleura: Moderate centrilobular emphysema. No focal consolidation, pleural effusion, pneumothorax. There is a 6 mm right lower lobe nodule (series 9, image 100). There is a 5 mm right apical nodule or a focus of scarring/parenchymal confluence. The central airways are patent. Musculoskeletal:  Degenerative changes of the spine. No acute osseous pathology. Review of the MIP images confirms the above findings. CTA ABDOMEN AND PELVIS FINDINGS VASCULAR Aorta: Moderate atherosclerotic calcification. No aneurysmal dilatation or dissection. Celiac: There is focal area of narrowing of the origin of the celiac axis. The celiac artery and its major branches remain patent. SMA: Patent without evidence of aneurysm, dissection, vasculitis or significant stenosis. Renals: Both renal arteries are patent without evidence of aneurysm, dissection, vasculitis, fibromuscular dysplasia or significant stenosis. IMA: Patent without evidence of aneurysm, dissection, vasculitis or significant stenosis. Inflow: Moderate atherosclerotic disease. No aneurysmal dilatation or dissection. The iliac arteries remain patent. Veins: No obvious venous abnormality within the limitations of this arterial phase study. Review of the MIP images confirms the above findings. NON-VASCULAR No intra-abdominal free air or free fluid. Hepatobiliary: The liver is unremarkable. No intrahepatic biliary ductal dilatation. Cholecystectomy. Pancreas: Unremarkable. No pancreatic ductal dilatation or surrounding inflammatory changes. Spleen: Normal in size without focal abnormality. Adrenals/Urinary Tract: The adrenal glands, kidneys, visualized ureters, and urinary bladder appear unremarkable. Stomach/Bowel: There is moderate stool throughout the colon. There is no bowel obstruction or active inflammation. The appendix is normal. Lymphatic: No adenopathy. Reproductive: Enlarged prostate gland measuring approximately 4.6 cm in transverse axial diameter. The seminal vesicles are symmetric. Other: None Musculoskeletal: Multilevel degenerative changes of the spine with endplate irregularity and disc space narrowing and bone spurring. No acute osseous pathology. Review of the MIP images confirms the above findings. IMPRESSION: 1. No acute intrathoracic, abdominal,  or pelvic pathology. No aortic aneurysm or dissection. No pulmonary artery embolus. 2. A 6 mm right lower lobe pulmonary nodule. Non-contrast chest CT at 6-12 months is recommended. If the nodule is stable at time of repeat CT, then future CT at 18-24 months (from today's scan) is considered optional for low-risk patients, but is recommended for high-risk patients. This recommendation follows the consensus statement: Guidelines for Management of Incidental Pulmonary Nodules Detected on CT Images: From the Fleischner Society 2017; Radiology 2017; 284:228-243. 3. Aortic Atherosclerosis (ICD10-I70.0) and Emphysema (ICD10-J43.9). Electronically Signed   By: Elgie Collard M.D.   On: 10/24/2019 20:24    EKG: Independently reviewed. Sinus rhythm, biatrial enlargement, non-specific ST-T abnormality.   Assessment/Plan   1. Non-STEMI  - Presents with chest pain and is found to have non-specific ST abnormality on EKG and elevated HS troponin, peaked at 149  - He was treated in ED with ASA 324 mg, started on IV heparin, and started on NTG infusion with resolution of chest pain  - Cardiology consulting and much appreciated  - Continue cardiac monitoring, continue IV heparin and nitroglycerin for now, repeat EKG    2. Hypertension  - BP at goal, continue Norvasc   3. Hyponatremia  - Serum sodium is 123 in ED; patient reports low sodium for at least a couple months and had an ear  surgery cancelled for this reason  - He appears hypovolemic and was given a liter of NS in ED  - Excessive beer drinking likely the culprit and anticipate improvement with restriction of free water  - Check urine sodium and osm, keep NPO pending cardiology eval, and continue gentle IVF hydration with NS    4. Alcohol dependence  - Patient reports recently cutting back to three 24 oz beers per day  - No signs of intoxication or withdrawal on admission  - Monitor with CIWA, supplement vitamins   5. Lung nodule  - 6 mm RLL  nodule noted on CT in ED  - Patient aware of recommendation for non-contrast CT in 6-12 months    DVT prophylaxis: IV heparin  Code Status: Full  Family Communication: Discussed with patient  Disposition Plan: Likely home in 1-2 days pending cardiology clearance  Consults called: Cardiology consulted by ED physician  Admission status: Observation     Briscoe Deutscher, MD [facility] Pager: See [email or web address]  If 7AM-7PM, please contact the daytime attending [email or web address]  10/25/2019, 2:49 AM

## 2019-10-25 NOTE — ED Notes (Signed)
Obtained consent for cath 

## 2019-10-25 NOTE — Consult Note (Signed)
CARDIOLOGY CONSULT NOTE  Patient ID: Michael Rivas MRN: [id number] DOB/AGE: 1956-05-15 64 y.o.  Admit date: 10/24/2019 Referring Physician: Teddy Spike, DO Primary Physician:  [name], MD Reason for Consultation: Chest pain  HPI:  [name] is a 64 y.o. male who presents with a chief complaint of " chest pain."  His past medical history and cardiovascular risk factors include: Hypertension and active tobacco use.  Patient originally presented to [facility] with a chief complaint of chest pain.  The work-up there noted EKG changes, troponin elevations, and active chest pain.  He was requested to be transferred to [facility] for further evaluation.  Patient states that his chest pain started on Tuesday evening and was getting progressively worse leading into Wednesday.  On Wednesday he stated that the chest pain was located substernally intensity prior to hospitalization was 10 out of 10, worse with effort related activities, improving with rest.  His chest pain at the time of the evaluation is 0 out of 10.  In addition to substernal chest pain he also was having effort related dyspnea.  Prior to the hospitalization any plan patient had episodes of nausea and vomiting as well.  Patient was started on IV heparin and nitro drip and transferred to [facility] for further evaluation.  Patient is resting comfortably at the time of today's evaluation and is chest pain-free.  Patient denies lightheadedness, dizziness, palpitations, orthopnea, paroxysmal nocturnal dyspnea, lower extremity swelling, near-syncope, syncope, hemoptysis, hematemesis, melanotic stools, or focal neurological deficits.  ALLERGIES: No Known Allergies  PAST MEDICAL HISTORY: Past Medical History:  Diagnosis Date  . Hypertension 2012  Hyponatremia  PAST SURGICAL HISTORY: Past Surgical History:  Procedure Laterality Date  . BACK SURGERY  2016  . CHOLECYSTECTOMY  2007    FAMILY HISTORY: The  patient family history includes Diabetes in his maternal aunt and mother; Heart attack in his father, maternal aunt, and paternal uncle; Heart failure in his father; Hypertension in his mother; Stroke in his maternal uncle, maternal uncle, mother, and paternal uncle.   SOCIAL HISTORY:  The patient  reports that he has been smoking cigars and cigarettes. He has a 22.50 pack-year smoking history. He has never used smokeless tobacco. He reports current alcohol use of about 35.0 standard drinks of alcohol per week. He reports that he does not use drugs.  Patient states that he has been smoking at least 1 pack/day for last 45 years equivalent to 45-year pack history of smoking.  He has 2 or 3 alcoholic beverages on a daily basis.  Patient denies any recreational drug use.  MEDICATIONS: Prior to Admission medications   Medication Sig Start Date End Date Taking? Authorizing Provider  amLODipine (NORVASC) 10 MG tablet Take 10 mg by mouth daily. 10/08/19  Yes [provider]    . sodium chloride    . heparin 1,000 Units/hr (10/25/19 0502)  . nitroGLYCERIN 15 mcg/min (10/25/19 0531)    Current Outpatient Medications  Medication Instructions  . amLODipine (NORVASC) 10 mg, Oral, Daily    14 ORGAN REVIEW OF SYSTEMS: CONSTITUTIONAL: No fever or significant weight loss EYES: No recent significant visual change EARS, NOSE, MOUTH, THROAT: No recent significant change in hearing CARDIOVASCULAR: See discussion in subjective/HPI RESPIRATORY: See discussion in subjective/HPI GASTROINTESTINAL: No recent complaints of abdominal pain GENITOURINARY: No recent significant change in genitourinary status MUSCULOSKELETAL: No recent significant change in musculoskeletal status INTEGUMENTARY: No recent rash NEUROLOGIC: No recent significant change in motor function PSYCHIATRIC: No recent  significant change in mood ENDOCRINOLOGIC: No recent significant change in endocrine status HEMATOLOGIC/LYMPHATIC: No  recent significant unexpected bruising ALLERGIC/IMMUNOLOGIC: No recent unexplained allergic reaction  PHYSICAL EXAM: Vitals with BMI 10/25/2019 10/25/2019 10/25/2019  Height - - -  Weight - - -  BMI - - -  Systolic 129 108 161  Diastolic 82 72 78  Pulse 68 60 74   CONSTITUTIONAL: Well-developed and well-nourished. No acute distress.  SKIN: Skin is warm and dry. No rash noted. No cyanosis. No pallor. No jaundice HEAD: Normocephalic and atraumatic.  EYES: No scleral icterus MOUTH/THROAT: Moist oral membranes.  NECK: No JVD present. No thyromegaly noted. No carotid bruits  LYMPHATIC: No visible cervical adenopathy.  CHEST Normal respiratory effort. No intercostal retractions  LUNGS: Clear to auscultation bilaterally.  No stridor. No wheezes. No rales.  CARDIOVASCULAR: Regular rate and rhythm, positive S1-S2, no murmurs rubs or gallops appreciated. ABDOMINAL: Soft,  nontender, nondistended, positive bowel sounds in all 4 quadrants.  No apparent ascites.  EXTREMITIES: No peripheral edema  HEMATOLOGIC: No significant bruising NEUROLOGIC: Oriented to person, place, and time. Nonfocal. Normal muscle tone.  PSYCHIATRIC: Normal mood and affect. Normal behavior. Cooperative  RADIOLOGY: DG Chest 2 View  Result Date: 10/24/2019 CLINICAL DATA:  Chest pain, shortness of breath since last evening EXAM: CHEST - 2 VIEW COMPARISON:  12/21/2018 FINDINGS: Frontal and lateral views of the chest demonstrate an unremarkable cardiac silhouette. The lungs are hyperinflated with background emphysema unchanged. No airspace disease, effusion, or pneumothorax. No acute bony abnormalities. IMPRESSION: 1. Background emphysema.  No acute process. Electronically Signed   By: Sharlet Salina M.D.   On: 10/24/2019 17:54   CT Angio Chest/Abd/Pel for Dissection W and/or Wo Contrast  Result Date: 10/24/2019 CLINICAL DATA:  64 year old male with chest pain and shortness of breath. EXAM: CT ANGIOGRAPHY CHEST, ABDOMEN AND  PELVIS TECHNIQUE: Multidetector CT imaging through the chest, abdomen and pelvis was performed using the standard protocol during bolus administration of intravenous contrast. Multiplanar reconstructed images and MIPs were obtained and reviewed to evaluate the vascular anatomy. CONTRAST:  OMNIPAQUE IOHEXOL 350 MG/ML SOLN COMPARISON:  CT abdomen pelvis dated 10/14/2013 and chest radiograph dated 10/24/2019. FINDINGS: CTA CHEST FINDINGS Cardiovascular: There is no cardiomegaly or pericardial effusion. Coronary vascular calcification primarily involving RCA and left circumflex artery. Minimal atherosclerotic calcification of the thoracic aorta. No aneurysmal dilatation or dissection. The origins of the great vessels of the aortic arch appear patent. No pulmonary artery embolus identified. Mediastinum/Nodes: No hilar or mediastinal adenopathy. The esophagus and the thyroid gland are grossly unremarkable. No mediastinal fluid collection. Lungs/Pleura: Moderate centrilobular emphysema. No focal consolidation, pleural effusion, pneumothorax. There is a 6 mm right lower lobe nodule (series 9, image 100). There is a 5 mm right apical nodule or a focus of scarring/parenchymal confluence. The central airways are patent. Musculoskeletal: Degenerative changes of the spine. No acute osseous pathology. Review of the MIP images confirms the above findings. CTA ABDOMEN AND PELVIS FINDINGS VASCULAR Aorta: Moderate atherosclerotic calcification. No aneurysmal dilatation or dissection. Celiac: There is focal area of narrowing of the origin of the celiac axis. The celiac artery and its major branches remain patent. SMA: Patent without evidence of aneurysm, dissection, vasculitis or significant stenosis. Renals: Both renal arteries are patent without evidence of aneurysm, dissection, vasculitis, fibromuscular dysplasia or significant stenosis. IMA: Patent without evidence of aneurysm, dissection, vasculitis or significant stenosis.  Inflow: Moderate atherosclerotic disease. No aneurysmal dilatation or dissection. The iliac arteries remain patent. Veins: No obvious venous  abnormality within the limitations of this arterial phase study. Review of the MIP images confirms the above findings. NON-VASCULAR No intra-abdominal free air or free fluid. Hepatobiliary: The liver is unremarkable. No intrahepatic biliary ductal dilatation. Cholecystectomy. Pancreas: Unremarkable. No pancreatic ductal dilatation or surrounding inflammatory changes. Spleen: Normal in size without focal abnormality. Adrenals/Urinary Tract: The adrenal glands, kidneys, visualized ureters, and urinary bladder appear unremarkable. Stomach/Bowel: There is moderate stool throughout the colon. There is no bowel obstruction or active inflammation. The appendix is normal. Lymphatic: No adenopathy. Reproductive: Enlarged prostate gland measuring approximately 4.6 cm in transverse axial diameter. The seminal vesicles are symmetric. Other: None Musculoskeletal: Multilevel degenerative changes of the spine with endplate irregularity and disc space narrowing and bone spurring. No acute osseous pathology. Review of the MIP images confirms the above findings. IMPRESSION: 1. No acute intrathoracic, abdominal, or pelvic pathology. No aortic aneurysm or dissection. No pulmonary artery embolus. 2. A 6 mm right lower lobe pulmonary nodule. Non-contrast chest CT at 6-12 months is recommended. If the nodule is stable at time of repeat CT, then future CT at 18-24 months (from today's scan) is considered optional for low-risk patients, but is recommended for high-risk patients. This recommendation follows the consensus statement: Guidelines for Management of Incidental Pulmonary Nodules Detected on CT Images: From the Fleischner Society 2017; Radiology 2017; 284:228-243. 3. Aortic Atherosclerosis (ICD10-I70.0) and Emphysema (ICD10-J43.9). Electronically Signed   By: Anner Crete M.D.   On:  10/24/2019 20:24    LABORATORY DATA: Lab Results  Component Value Date   WBC 6.0 10/25/2019   HGB 13.6 10/25/2019   HCT 39.8 10/25/2019   MCV 83.1 10/25/2019   PLT 324 10/25/2019    Recent Labs  Lab 10/25/19 0606  NA 127*  K 4.2  CL 97*  CO2 20*  BUN <5*  CREATININE 0.70  CALCIUM 8.8*  PROT 7.1  BILITOT 0.6  ALKPHOS 50  ALT 12  AST 18  GLUCOSE 107*    Lipid Panel     Component Value Date/Time   CHOL 165 03/02/2016 0820   TRIG 65 03/02/2016 0820   HDL 44 03/02/2016 0820   CHOLHDL 3.8 03/02/2016 0820   VLDL 13 03/02/2016 0820   LDLCALC 108 03/02/2016 0820    BNP (last 3 results) No results for input(s): BNP in the last 8760 hours.  HEMOGLOBIN A1C No results found for: HGBA1C, MPG  Cardiac Panel (last 3 results) High sensitive troponin: 10/24/2019 1743: 149 10/24/2019 1939: 116   Lab Results  Component Value Date   CKTOTAL 191 08/05/2007   CKMB 2.3 08/05/2007     TSH No results for input(s): TSH in the last 8760 hours.   Scheduled Meds: . amLODipine  10 mg Oral Daily  . [START ON 10/26/2019] aspirin EC  81 mg Oral Daily  . atorvastatin  80 mg Oral q1800  . folic acid  1 mg Oral Daily  . LORazepam  0-4 mg Oral Q6H   Followed by  . [START ON 10/27/2019] LORazepam  0-4 mg Oral Q12H  . multivitamin with minerals  1 tablet Oral Daily  . pantoprazole  40 mg Oral Daily  . thiamine  100 mg Oral Daily   Or  . thiamine  100 mg Intravenous Daily   Continuous Infusions: . sodium chloride    . heparin 1,000 Units/hr (10/25/19 0502)  . nitroGLYCERIN 15 mcg/min (10/25/19 0531)   PRN Meds:.acetaminophen, LORazepam **OR** LORazepam, ondansetron (ZOFRAN) IV  CARDIAC DATABASE: EKG: 10/24/2019 1726: Normal sinus rhythm  ventricular rate of 79 bpm, rightward axis, biatrial enlargement, old anteroseptal infarct, poor R wave progression, ST depressions in the inferior and lateral leads, without evidence of myocardial injury pattern.  10/25/2019 821: Normal sinus  rhythm ventricular rate of 64 bpm, old anteroseptal infarct, ST depressions in the inferior lateral leads have improved compared to prior EKG.  No evidence of myocardial injury pattern.  Echocardiogram: pending  IMPRESSION AND RECOMMENDATIONS: Brax Enke is a 63 y.o. male whose past medical history and cardiovascular risk factors include: Hypertension, active tobacco use.  PRIMARY DIAGNOSIS: Non-STEMI His chest pain is consistent with angina pectoris, high sensitive troponins positive, initial EKG findings noted for ST depressions in inferolateral leads. Continue IV heparin drip Currently on IV nitro drip Echocardiogram pending Initiated on aspirin 81 mg p.o. daily. Initiated on high intensity statin therapy. Discussed undergoing left heart catheterization with possible intervention given his non-STEMI. The left heart catheterization procedure was explained to the patient in detail. The indication, alternatives, risks and benefits were reviewed. Complications including but not limited to bleeding, infection, acute kidney injury, blood transfusion, heart rhythm disturbances, contrast (dye) reaction, damage to the arteries or nerves in the legs or hands, cerebrovascular accident, myocardial infarction, need for emergent bypass surgery, blood clots in the legs, possible need for emergent blood transfusion, and rarely death were reviewed and discussed with the patient. The patient voices understanding and wishes to proceed.  Patient would like us to discuss his heart cath findings with his girlfriend Evon at [phone]. Educated on importance of complete smoking cessation. We will screen for diabetes mellitus, check hemoglobin A1c. Check TSH. Check fasting lipid profile. Further recommendations to follow after left heart catheterization.  Angina pectoris: See above  Elevated troponin: secondary to NSTEMI see above.   SECONDARY DIAGNOSIS: Benign essential hypertension: Currently managed by  primary team.  Hyponatremia: Management per primary team.  Pulmonary nodule: Noted on CT scan performed in any pain, needs to be followed up as outpatient, patient informed and management primary team  Active tobacco use: Educated on importance of complete smoking cessation.  Patient's questions and concerns were addressed to his satisfaction. He voices understanding of the instructions provided during this encounter.   This note was created using a voice recognition software as a result there may be grammatical errors inadvertently enclosed that do not reflect the nature of this encounter. Every attempt is made to correct such errors.  Gayle Collard, DO, FACC [facility] Office: [phone] 10/25/2019, 8:25 AM 

## 2019-10-25 NOTE — H&P (View-Only) (Signed)
CARDIOLOGY CONSULT NOTE  Patient ID: Michael Rivas MRN: [id number] DOB/AGE: 1956-05-15 64 y.o.  Admit date: 10/24/2019 Referring Physician: Teddy Spike, DO Primary Physician:  [name], MD Reason for Consultation: Chest pain  HPI:  Michael Rivas is a 64 y.o. male who presents with a chief complaint of " chest pain."  His past medical history and cardiovascular risk factors include: Hypertension and active tobacco use.  Patient originally presented to [facility] with a chief complaint of chest pain.  The work-up there noted EKG changes, troponin elevations, and active chest pain.  He was requested to be transferred to [facility] for further evaluation.  Patient states that his chest pain started on Tuesday evening and was getting progressively worse leading into Wednesday.  On Wednesday he stated that the chest pain was located substernally intensity prior to hospitalization was 10 out of 10, worse with effort related activities, improving with rest.  His chest pain at the time of the evaluation is 0 out of 10.  In addition to substernal chest pain he also was having effort related dyspnea.  Prior to the hospitalization any plan patient had episodes of nausea and vomiting as well.  Patient was started on IV heparin and nitro drip and transferred to [facility] for further evaluation.  Patient is resting comfortably at the time of today's evaluation and is chest pain-free.  Patient denies lightheadedness, dizziness, palpitations, orthopnea, paroxysmal nocturnal dyspnea, lower extremity swelling, near-syncope, syncope, hemoptysis, hematemesis, melanotic stools, or focal neurological deficits.  ALLERGIES: No Known Allergies  PAST MEDICAL HISTORY: Past Medical History:  Diagnosis Date  . Hypertension 2012  Hyponatremia  PAST SURGICAL HISTORY: Past Surgical History:  Procedure Laterality Date  . BACK SURGERY  2016  . CHOLECYSTECTOMY  2007    FAMILY HISTORY: The  patient family history includes Diabetes in his maternal aunt and mother; Heart attack in his father, maternal aunt, and paternal uncle; Heart failure in his father; Hypertension in his mother; Stroke in his maternal uncle, maternal uncle, mother, and paternal uncle.   SOCIAL HISTORY:  The patient  reports that he has been smoking cigars and cigarettes. He has a 22.50 pack-year smoking history. He has never used smokeless tobacco. He reports current alcohol use of about 35.0 standard drinks of alcohol per week. He reports that he does not use drugs.  Patient states that he has been smoking at least 1 pack/day for last 45 years equivalent to 45-year pack history of smoking.  He has 2 or 3 alcoholic beverages on a daily basis.  Patient denies any recreational drug use.  MEDICATIONS: Prior to Admission medications   Medication Sig Start Date End Date Taking? Authorizing Provider  amLODipine (NORVASC) 10 MG tablet Take 10 mg by mouth daily. 10/08/19  Yes [provider]    . sodium chloride    . heparin 1,000 Units/hr (10/25/19 0502)  . nitroGLYCERIN 15 mcg/min (10/25/19 0531)    Current Outpatient Medications  Medication Instructions  . amLODipine (NORVASC) 10 mg, Oral, Daily    14 ORGAN REVIEW OF SYSTEMS: CONSTITUTIONAL: No fever or significant weight loss EYES: No recent significant visual change EARS, NOSE, MOUTH, THROAT: No recent significant change in hearing CARDIOVASCULAR: See discussion in subjective/HPI RESPIRATORY: See discussion in subjective/HPI GASTROINTESTINAL: No recent complaints of abdominal pain GENITOURINARY: No recent significant change in genitourinary status MUSCULOSKELETAL: No recent significant change in musculoskeletal status INTEGUMENTARY: No recent rash NEUROLOGIC: No recent significant change in motor function PSYCHIATRIC: No recent  significant change in mood ENDOCRINOLOGIC: No recent significant change in endocrine status HEMATOLOGIC/LYMPHATIC: No  recent significant unexpected bruising ALLERGIC/IMMUNOLOGIC: No recent unexplained allergic reaction  PHYSICAL EXAM: Vitals with BMI 10/25/2019 10/25/2019 10/25/2019  Height - - -  Weight - - -  BMI - - -  Systolic 129 108 161  Diastolic 82 72 78  Pulse 68 60 74   CONSTITUTIONAL: Well-developed and well-nourished. No acute distress.  SKIN: Skin is warm and dry. No rash noted. No cyanosis. No pallor. No jaundice HEAD: Normocephalic and atraumatic.  EYES: No scleral icterus MOUTH/THROAT: Moist oral membranes.  NECK: No JVD present. No thyromegaly noted. No carotid bruits  LYMPHATIC: No visible cervical adenopathy.  CHEST Normal respiratory effort. No intercostal retractions  LUNGS: Clear to auscultation bilaterally.  No stridor. No wheezes. No rales.  CARDIOVASCULAR: Regular rate and rhythm, positive S1-S2, no murmurs rubs or gallops appreciated. ABDOMINAL: Soft,  nontender, nondistended, positive bowel sounds in all 4 quadrants.  No apparent ascites.  EXTREMITIES: No peripheral edema  HEMATOLOGIC: No significant bruising NEUROLOGIC: Oriented to person, place, and time. Nonfocal. Normal muscle tone.  PSYCHIATRIC: Normal mood and affect. Normal behavior. Cooperative  RADIOLOGY: DG Chest 2 View  Result Date: 10/24/2019 CLINICAL DATA:  Chest pain, shortness of breath since last evening EXAM: CHEST - 2 VIEW COMPARISON:  12/21/2018 FINDINGS: Frontal and lateral views of the chest demonstrate an unremarkable cardiac silhouette. The lungs are hyperinflated with background emphysema unchanged. No airspace disease, effusion, or pneumothorax. No acute bony abnormalities. IMPRESSION: 1. Background emphysema.  No acute process. Electronically Signed   By: Sharlet Salina M.D.   On: 10/24/2019 17:54   CT Angio Chest/Abd/Pel for Dissection W and/or Wo Contrast  Result Date: 10/24/2019 CLINICAL DATA:  64 year old male with chest pain and shortness of breath. EXAM: CT ANGIOGRAPHY CHEST, ABDOMEN AND  PELVIS TECHNIQUE: Multidetector CT imaging through the chest, abdomen and pelvis was performed using the standard protocol during bolus administration of intravenous contrast. Multiplanar reconstructed images and MIPs were obtained and reviewed to evaluate the vascular anatomy. CONTRAST:  OMNIPAQUE IOHEXOL 350 MG/ML SOLN COMPARISON:  CT abdomen pelvis dated 10/14/2013 and chest radiograph dated 10/24/2019. FINDINGS: CTA CHEST FINDINGS Cardiovascular: There is no cardiomegaly or pericardial effusion. Coronary vascular calcification primarily involving RCA and left circumflex artery. Minimal atherosclerotic calcification of the thoracic aorta. No aneurysmal dilatation or dissection. The origins of the great vessels of the aortic arch appear patent. No pulmonary artery embolus identified. Mediastinum/Nodes: No hilar or mediastinal adenopathy. The esophagus and the thyroid gland are grossly unremarkable. No mediastinal fluid collection. Lungs/Pleura: Moderate centrilobular emphysema. No focal consolidation, pleural effusion, pneumothorax. There is a 6 mm right lower lobe nodule (series 9, image 100). There is a 5 mm right apical nodule or a focus of scarring/parenchymal confluence. The central airways are patent. Musculoskeletal: Degenerative changes of the spine. No acute osseous pathology. Review of the MIP images confirms the above findings. CTA ABDOMEN AND PELVIS FINDINGS VASCULAR Aorta: Moderate atherosclerotic calcification. No aneurysmal dilatation or dissection. Celiac: There is focal area of narrowing of the origin of the celiac axis. The celiac artery and its major branches remain patent. SMA: Patent without evidence of aneurysm, dissection, vasculitis or significant stenosis. Renals: Both renal arteries are patent without evidence of aneurysm, dissection, vasculitis, fibromuscular dysplasia or significant stenosis. IMA: Patent without evidence of aneurysm, dissection, vasculitis or significant stenosis.  Inflow: Moderate atherosclerotic disease. No aneurysmal dilatation or dissection. The iliac arteries remain patent. Veins: No obvious venous  abnormality within the limitations of this arterial phase study. Review of the MIP images confirms the above findings. NON-VASCULAR No intra-abdominal free air or free fluid. Hepatobiliary: The liver is unremarkable. No intrahepatic biliary ductal dilatation. Cholecystectomy. Pancreas: Unremarkable. No pancreatic ductal dilatation or surrounding inflammatory changes. Spleen: Normal in size without focal abnormality. Adrenals/Urinary Tract: The adrenal glands, kidneys, visualized ureters, and urinary bladder appear unremarkable. Stomach/Bowel: There is moderate stool throughout the colon. There is no bowel obstruction or active inflammation. The appendix is normal. Lymphatic: No adenopathy. Reproductive: Enlarged prostate gland measuring approximately 4.6 cm in transverse axial diameter. The seminal vesicles are symmetric. Other: None Musculoskeletal: Multilevel degenerative changes of the spine with endplate irregularity and disc space narrowing and bone spurring. No acute osseous pathology. Review of the MIP images confirms the above findings. IMPRESSION: 1. No acute intrathoracic, abdominal, or pelvic pathology. No aortic aneurysm or dissection. No pulmonary artery embolus. 2. A 6 mm right lower lobe pulmonary nodule. Non-contrast chest CT at 6-12 months is recommended. If the nodule is stable at time of repeat CT, then future CT at 18-24 months (from today's scan) is considered optional for low-risk patients, but is recommended for high-risk patients. This recommendation follows the consensus statement: Guidelines for Management of Incidental Pulmonary Nodules Detected on CT Images: From the Fleischner Society 2017; Radiology 2017; 284:228-243. 3. Aortic Atherosclerosis (ICD10-I70.0) and Emphysema (ICD10-J43.9). Electronically Signed   By: Anner Crete M.D.   On:  10/24/2019 20:24    LABORATORY DATA: Lab Results  Component Value Date   WBC 6.0 10/25/2019   HGB 13.6 10/25/2019   HCT 39.8 10/25/2019   MCV 83.1 10/25/2019   PLT 324 10/25/2019    Recent Labs  Lab 10/25/19 0606  NA 127*  K 4.2  CL 97*  CO2 20*  BUN <5*  CREATININE 0.70  CALCIUM 8.8*  PROT 7.1  BILITOT 0.6  ALKPHOS 50  ALT 12  AST 18  GLUCOSE 107*    Lipid Panel     Component Value Date/Time   CHOL 165 03/02/2016 0820   TRIG 65 03/02/2016 0820   HDL 44 03/02/2016 0820   CHOLHDL 3.8 03/02/2016 0820   VLDL 13 03/02/2016 0820   LDLCALC 108 03/02/2016 0820    BNP (last 3 results) No results for input(s): BNP in the last 8760 hours.  HEMOGLOBIN A1C No results found for: HGBA1C, MPG  Cardiac Panel (last 3 results) High sensitive troponin: 10/24/2019 1743: 149 10/24/2019 1939: 116   Lab Results  Component Value Date   CKTOTAL 191 08/05/2007   CKMB 2.3 08/05/2007     TSH No results for input(s): TSH in the last 8760 hours.   Scheduled Meds: . amLODipine  10 mg Oral Daily  . [START ON 10/26/2019] aspirin EC  81 mg Oral Daily  . atorvastatin  80 mg Oral q1800  . folic acid  1 mg Oral Daily  . LORazepam  0-4 mg Oral Q6H   Followed by  . [START ON 10/27/2019] LORazepam  0-4 mg Oral Q12H  . multivitamin with minerals  1 tablet Oral Daily  . pantoprazole  40 mg Oral Daily  . thiamine  100 mg Oral Daily   Or  . thiamine  100 mg Intravenous Daily   Continuous Infusions: . sodium chloride    . heparin 1,000 Units/hr (10/25/19 0502)  . nitroGLYCERIN 15 mcg/min (10/25/19 0531)   PRN Meds:.acetaminophen, LORazepam **OR** LORazepam, ondansetron (ZOFRAN) IV  CARDIAC DATABASE: EKG: 10/24/2019 1726: Normal sinus rhythm  ventricular rate of 79 bpm, rightward axis, biatrial enlargement, old anteroseptal infarct, poor R wave progression, ST depressions in the inferior and lateral leads, without evidence of myocardial injury pattern.  10/25/2019 821: Normal sinus  rhythm ventricular rate of 64 bpm, old anteroseptal infarct, ST depressions in the inferior lateral leads have improved compared to prior EKG.  No evidence of myocardial injury pattern.  Echocardiogram: pending  IMPRESSION AND RECOMMENDATIONS: Michael Rivas is a 64 y.o. male whose past medical history and cardiovascular risk factors include: Hypertension, active tobacco use.  PRIMARY DIAGNOSIS: Non-STEMI His chest pain is consistent with angina pectoris, high sensitive troponins positive, initial EKG findings noted for ST depressions in inferolateral leads. Continue IV heparin drip Currently on IV nitro drip Echocardiogram pending Initiated on aspirin 81 mg p.o. daily. Initiated on high intensity statin therapy. Discussed undergoing left heart catheterization with possible intervention given his non-STEMI. The left heart catheterization procedure was explained to the patient in detail. The indication, alternatives, risks and benefits were reviewed. Complications including but not limited to bleeding, infection, acute kidney injury, blood transfusion, heart rhythm disturbances, contrast (dye) reaction, damage to the arteries or nerves in the legs or hands, cerebrovascular accident, myocardial infarction, need for emergent bypass surgery, blood clots in the legs, possible need for emergent blood transfusion, and rarely death were reviewed and discussed with the patient. The patient voices understanding and wishes to proceed.  Patient would like [place] to discuss his heart cath findings with his girlfriend Evon at [phone]. Educated on importance of complete smoking cessation. We will screen for diabetes mellitus, check hemoglobin A1c. Check TSH. Check fasting lipid profile. Further recommendations to follow after left heart catheterization.  Angina pectoris: See above  Elevated troponin: secondary to NSTEMI see above.   SECONDARY DIAGNOSIS: Benign essential hypertension: Currently managed by  primary team.  Hyponatremia: Management per primary team.  Pulmonary nodule: Noted on CT scan performed in any pain, needs to be followed up as outpatient, patient informed and management primary team  Active tobacco use: Educated on importance of complete smoking cessation.  Patient's questions and concerns were addressed to his satisfaction. He voices understanding of the instructions provided during this encounter.   This note was created using a voice recognition software as a result there may be grammatical errors inadvertently enclosed that do not reflect the nature of this encounter. Every attempt is made to correct such errors.  Tessa Lerner, DO, [facility] Office: [phone] 10/25/2019, 8:25 AM

## 2019-10-25 NOTE — Progress Notes (Signed)
Admitted pMN for NSTEMI. S/p cath w/ angioplasty. See H&P for details. See cath report for cath details. Getting echo during interview. Cards to start ASA, brilinta, high dose statin, and beta blocker. Appreciate assistance. Remainder as per H&P.   General: 64 y.o. male resting in bed in NAD Cardiovascular: RRR, +S1, S2, no m/g/r Respiratory: CTABL, no w/r/r, normal WOB GI: BS+, NDNT, no masses noted, no organomegaly noted MSK: No e/c/c Neuro: A&O x 3, no focal deficits Psyc: calm/cooperative  .Teddy Spike, DO

## 2019-10-25 NOTE — Progress Notes (Signed)
ANTICOAGULATION CONSULT NOTE - Follow-Up Consult  Pharmacy Consult for Heparin Infusion Indication: chest pain/ACS  No Known Allergies  Patient Measurements: Height: 6' (182.9 cm) Weight: 155 lb (70.3 kg) IBW/kg (Calculated) : 77.6 Heparin Dosing Weight: 70.3 kg  Vital Signs: Temp: 98.1 F (36.7 C) (03/24 2314) Temp Source: Oral (03/24 2314) BP: 127/78 (03/25 0230) Pulse Rate: 61 (03/25 0230)  Labs: Recent Labs    10/24/19 1743 10/24/19 1939 10/25/19 0220  HGB 14.7  --  13.6  HCT 42.8  --  39.8  PLT 331  --  324  APTT 36  --   --   LABPROT 13.0  --   --   INR 1.0  --   --   HEPARINUNFRC  --   --  0.25*  CREATININE 0.62  --   --   TROPONINIHS 149* 116*  --     Estimated Creatinine Clearance: 96.4 mL/min (by C-G formula based on SCr of 0.62 mg/dL).   Medical History: Past Medical History:  Diagnosis Date  . Hypertension 2012    Medications:  Amlodipine 10mg  po daily  Assessment: Pharmacy consulted to dose heparin infusion for chest pain/ACS.  Patient is not on any anticoagulation prior to admission.  The patient's heparin level early this morning is SUBtherapeutic (HL 0.25, goal of 0.3-0.7) though was draw ~2h early. CBC stable and wnl - no bleeding or issues noted per RN.   Goal of Therapy:  Heparin level 0.3-0.7 units/ml Monitor platelets by anticoagulation protocol: Yes   Plan:  - Increase Heparin to 1000 units/hr (10 ml/hr) - Will continue to monitor for any signs/symptoms of bleeding and will follow up with heparin level in 6 hours   Thank you for allowing pharmacy to be a part of this patient's care.  , PharmD, BCPS Clinical Pharmacist 10/25/2019 4:43 AM   **Pharmacist phone directory can now be found on [email or web address] ([email or web address]).  Listed under [facility].

## 2019-10-25 NOTE — ED Provider Notes (Signed)
D/w dr Odis Hollingshead with cardiology He now recommends medical admit due to lung nodule and hyponatremia D/w dr Antionette Char for admission    Zadie Rhine, MD 10/25/19 [phone]

## 2019-10-25 NOTE — Care Management (Signed)
10-25-19 1627 Benefits check submitted for Brilinta. Case Manager will follow for cost. Graves-Bigelow, Lamar Laundry, RN,BSN Case Manager

## 2019-10-25 NOTE — TOC Benefit Eligibility Note (Signed)
Transition of Care [facility]) Benefit Eligibility Note    Patient Details  Name: Michael Rivas MRN: [id number] Date of Birth: 1956/06/21   Medication/Dose: BRILINTA   90 MG BID  Covered?: Yes  Tier: 3 Drug  Prescription Coverage Preferred Pharmacy: [facility] [place] with Person/Company/Phone Number:: COURTNEY  @  [employer] # [phone] OPT- 2  Co-Pay: $45.00  Prior Approval: No  Deductible: Met  Additional Notes: TICAGRELOR  : Crecencio Mc Phone Number: 10/25/2019, 4:48 PM

## 2019-10-25 NOTE — CV Procedure (Signed)
Partially successful angioplasty LCx. Residual calcific 50-60% stenosis, which will need atherectomy at a staged procedure. Also has severe RCA disease. Continue medical management for now with Aspirin/Brilinta/high intensity statin/beta blocker. Full report to follow.  [name], MD [facility] Pager: [phone] Office: [phone]

## 2019-10-25 NOTE — Interval H&P Note (Signed)
History and Physical Interval Note:  10/25/2019 10:10 AM  Michael Rivas  has presented today for surgery, with the diagnosis of nonstemi.  The various methods of treatment have been discussed with the patient and family. After consideration of risks, benefits and other options for treatment, the patient has consented to  Procedure(s): LEFT HEART CATH AND CORONARY ANGIOGRAPHY (N/A) as a surgical intervention.  The patient's history has been reviewed, patient examined, no change in status, stable for surgery.  I have reviewed the patient's chart and labs.  Questions were answered to the patient's satisfaction.    2016 Appropriate Use Criteria for Coronary Revascularization in Patients With Acute Coronary Syndrome NSTEMI/UA High Risk (TIMI Score 5-7) NSTEMI/Unstable angina, stabilized patient at high risk Link Here: https://powell.info/ Indication:  Revascularization by PCI or CABG of 1 or more arteries in a patient with NSTEMI or unstable angina with Stabilization after presentation High risk for clinical events  A (7) Indication: 16; Score 7    [name] J Gloris Shiroma

## 2019-10-26 DIAGNOSIS — F10229 Alcohol dependence with intoxication, unspecified: Secondary | ICD-10-CM | POA: Diagnosis not present

## 2019-10-26 DIAGNOSIS — Z9861 Coronary angioplasty status: Secondary | ICD-10-CM

## 2019-10-26 DIAGNOSIS — I2510 Atherosclerotic heart disease of native coronary artery without angina pectoris: Secondary | ICD-10-CM

## 2019-10-26 DIAGNOSIS — I10 Essential (primary) hypertension: Secondary | ICD-10-CM | POA: Diagnosis not present

## 2019-10-26 DIAGNOSIS — Z955 Presence of coronary angioplasty implant and graft: Secondary | ICD-10-CM | POA: Diagnosis not present

## 2019-10-26 DIAGNOSIS — F1029 Alcohol dependence with unspecified alcohol-induced disorder: Secondary | ICD-10-CM | POA: Diagnosis not present

## 2019-10-26 DIAGNOSIS — F17210 Nicotine dependence, cigarettes, uncomplicated: Secondary | ICD-10-CM | POA: Diagnosis not present

## 2019-10-26 DIAGNOSIS — Z20822 Contact with and (suspected) exposure to covid-19: Secondary | ICD-10-CM | POA: Diagnosis not present

## 2019-10-26 DIAGNOSIS — R7303 Prediabetes: Secondary | ICD-10-CM | POA: Diagnosis not present

## 2019-10-26 DIAGNOSIS — H7122 Cholesteatoma of mastoid, left ear: Secondary | ICD-10-CM

## 2019-10-26 DIAGNOSIS — I259 Chronic ischemic heart disease, unspecified: Secondary | ICD-10-CM | POA: Diagnosis not present

## 2019-10-26 DIAGNOSIS — I25118 Atherosclerotic heart disease of native coronary artery with other forms of angina pectoris: Secondary | ICD-10-CM

## 2019-10-26 DIAGNOSIS — I214 Non-ST elevation (NSTEMI) myocardial infarction: Secondary | ICD-10-CM | POA: Diagnosis not present

## 2019-10-26 DIAGNOSIS — R911 Solitary pulmonary nodule: Secondary | ICD-10-CM | POA: Diagnosis not present

## 2019-10-26 DIAGNOSIS — I209 Angina pectoris, unspecified: Secondary | ICD-10-CM | POA: Diagnosis not present

## 2019-10-26 DIAGNOSIS — R778 Other specified abnormalities of plasma proteins: Secondary | ICD-10-CM | POA: Diagnosis not present

## 2019-10-26 DIAGNOSIS — E871 Hypo-osmolality and hyponatremia: Secondary | ICD-10-CM | POA: Diagnosis not present

## 2019-10-26 DIAGNOSIS — R739 Hyperglycemia, unspecified: Secondary | ICD-10-CM | POA: Diagnosis not present

## 2019-10-26 LAB — CBC
HCT: 35.6 % — ABNORMAL LOW (ref 39.0–52.0)
Hemoglobin: 12.2 g/dL — ABNORMAL LOW (ref 13.0–17.0)
MCH: 28.5 pg (ref 26.0–34.0)
MCHC: 34.3 g/dL (ref 30.0–36.0)
MCV: 83.2 fL (ref 80.0–100.0)
Platelets: 272 10*3/uL (ref 150–400)
RBC: 4.28 MIL/uL (ref 4.22–5.81)
RDW: 14 % (ref 11.5–15.5)
WBC: 5.4 10*3/uL (ref 4.0–10.5)
nRBC: 0 % (ref 0.0–0.2)

## 2019-10-26 LAB — BASIC METABOLIC PANEL
Anion gap: 7 (ref 5–15)
BUN: 5 mg/dL — ABNORMAL LOW (ref 8–23)
CO2: 22 mmol/L (ref 22–32)
Calcium: 8.7 mg/dL — ABNORMAL LOW (ref 8.9–10.3)
Chloride: 101 mmol/L (ref 98–111)
Creatinine, Ser: 0.88 mg/dL (ref 0.61–1.24)
GFR calc Af Amer: >60 mL/min (ref >60)
GFR calc non Af Amer: >60 mL/min (ref >60)
Glucose, Bld: 107 mg/dL — ABNORMAL HIGH (ref 70–99)
Potassium: 4.3 mmol/L (ref 3.5–5.1)
Sodium: 130 mmol/L — ABNORMAL LOW (ref 135–145)

## 2019-10-26 LAB — MAGNESIUM: Magnesium: 1.9 mg/dL (ref 1.7–2.4)

## 2019-10-26 MED ORDER — LISINOPRIL 10 MG PO TABS: 10.0000 mg | ORAL_TABLET | Freq: Every evening | ORAL | Status: DC

## 2019-10-26 MED ORDER — NITROGLYCERIN 0.4 MG SL SUBL: 0.4000 mg | SUBLINGUAL_TABLET | SUBLINGUAL | 3 refills | Status: AC | PRN

## 2019-10-26 MED ORDER — LISINOPRIL 10 MG PO TABS: 10.0000 mg | ORAL_TABLET | Freq: Every evening | ORAL | 1 refills | Status: DC

## 2019-10-26 MED ORDER — TICAGRELOR 90 MG PO TABS: 90.0000 mg | ORAL_TABLET | Freq: Two times a day (BID) | ORAL | 1 refills | Status: DC

## 2019-10-26 MED ORDER — METOPROLOL SUCCINATE ER 25 MG PO TB24
25.0000 mg | ORAL_TABLET | Freq: Every morning | ORAL | Status: DC
  Administered 2019-10-26: 25 mg via ORAL
  Filled 2019-10-26: qty 1

## 2019-10-26 MED ORDER — ASPIRIN 81 MG PO TBEC: 81.0000 mg | DELAYED_RELEASE_TABLET | Freq: Every day | ORAL | 1 refills | Status: DC

## 2019-10-26 MED ORDER — METOPROLOL SUCCINATE ER 25 MG PO TB24: 25.0000 mg | ORAL_TABLET | Freq: Every morning | ORAL | 1 refills | Status: DC

## 2019-10-26 MED ORDER — ATORVASTATIN CALCIUM 80 MG PO TABS: 80.0000 mg | ORAL_TABLET | Freq: Every day | ORAL | 1 refills | Status: DC

## 2019-10-26 MED FILL — ASPIRIN LOW DOSE 81 MG TBEC: 81 | 30 days supply | Qty: 30 | Fill #0

## 2019-10-26 MED FILL — ATORVASTATIN CALCIUM 80 MG: 80 | 30 days supply | Qty: 30 | Fill #0

## 2019-10-26 MED FILL — BRILINTA 90 MG TABLET: 90 | 30 days supply | Qty: 60 | Fill #0

## 2019-10-26 MED FILL — NITROGLYCERIN 0.4 MG TAB SL: 0.4 | 32 days supply | Qty: 100 | Fill #0

## 2019-10-26 MED FILL — METOPROLOL SUCCINATE ER 25: 25 | 30 days supply | Qty: 30 | Fill #0

## 2019-10-26 MED FILL — LISINOPRIL 10 MG TABS: 10 | 30 days supply | Qty: 30 | Fill #0

## 2019-10-26 NOTE — Discharge Summary (Addendum)
. Physician Discharge Summary  Michael Rivas OJJ:[id number] DOB: Aug 21, 1955 DOA: 10/24/2019  PCP: Celene Squibb, MD  Admit date: 10/24/2019 Discharge date: 10/26/2019  Admitted From: Home Disposition:  Discharged to home.   Recommendations for Outpatient Follow-up:  1. Follow up with PCP in 1 weeks. 2. Please obtain BMP/CBC in one week. 3. Follow up with cardiology. They will call with an appt time. 4. Follow up with ENT.  Discharge Condition: Stable  CODE STATUS: FULL   Brief/Interim Summary: Shamarr Murphyis a 64 y.o.malewith medical history significant forhypertension and alcohol dependence, now presenting to the emergency department for evaluation of chest pain. Patient reports that he had been in his usual state of health until last night when he was watching TV, eating a hot dog, and drinking a beer, when he developed a vague chest discomfort with radiation to his jaw, nausea, and shortness of breath. Symptoms resolved without any intervention, but then returned today and have been persistent. He denies any associated cough, leg swelling, or leg tenderness. No recent fevers or chills.  Annie PennED Course:Upon arrival to the ED, patient is found to be afebrile, saturating well on room air, and with stable blood pressure. EKG features sinus rhythm with biatrial enlargement and nonspecific ST-T abnormality. Chest x-ray with emphysema but no acute findings. CTAchest/abdomen/pelvis was negative for acute findings and specifically negative for PE or aortic aneurysm or dissection. Chemistry panel with sodium 123. CBC unremarkable. High-sensitivity troponin elevated to 149, then down to 116. Patient was given a liter of normal saline, 3 and 24 mg aspirin, and started on IV heparin and nitroglycerin infusions in the ED. Cardiology was consulted by the ED physician and recommended transfer to [facility] ED for evaluation. Cardiology was reconsulted from the [facility] emergency  department and recommended a medical admission.  3/26: Now s/p LHC. Eval'd by cardiology this AM. They have cleared him for discharge from their standpoint. He will go home on ASA, brilinta, atorvastatin, lisinopril, and metoprolol. He will need to follow up with them for staged procedure to address his CAD. Spoke with his ENT (Dr. Constance Holster). There are no abx needed at discharge. He will need to maintain a Na+ level of 130 or above to have anesthesia comfortable with general sedation for his procedure. I have counseled him against further alcohol usage. Discharge plan and instructions have been explained in full detail. He has voiced understanding and agreement. He is ok for discharge today.    Discharge Diagnoses:  Principal Problem:   NSTEMI (non-ST elevated myocardial infarction) ([place]) Active Problems:   Essential hypertension, benign   Hyponatremia   Alcohol dependence ([place])   Pulmonary nodule   Chest pain due to myocardial ischemia   Coronary artery disease involving native coronary artery of native heart without angina pectoris   CAD S/P percutaneous coronary angioplasty   Prediabetes  Non-STEMI      - Presents with chest pain and is found to have non-specific ST abnormality on EKG and elevated HS troponin, peaked at 149      - He was treated in ED with ASA 324 mg, started on IV heparin, and started on NTG infusion with resolution of chest pain      - cardiology onboard; s/p LHC: "Partially successful angioplasty LCx. Residual calcific 50-60% stenosis, which will need atherectomy at a staged procedure. Also has severe RCA disease. Continue medical management for now with Aspirin/Brilinta/high intensity statin/beta blocker."     - 3/26: Eval'd by cardiology this AM. They  have cleared him for discharge from their standpoint. He will go home on ASA, brilinta, atorvastatin, lisinopril, and metoprolol. He will need to follow up with them for staged procedure to address his CAD.   Hypertension       - discontinue norvasc; adding lisinopril and metoprolol at discharge  Hyponatremia      - Serum sodium is 123 in ED; patient reports low sodium for at least a couple months and had an ear surgery cancelled for this reason       - Excessive beer drinking likely the culprit and anticipate improvement with restriction of free water      - 3/26: Na+ to 130 this AM; counseled against further alcohol use    Alcohol dependence      - Patient reports recently cutting back to three 24 oz beers per day      - No signs of intoxication or withdrawal on admission      - Monitor with CIWA, supplement vitamins     - 3/26: counseled against further alcohol use    Lung nodule      - 6 mm RLL nodule noted on CT in ED      - Patient aware of recommendation for non-contrast CT in 6-12 months   Left side middle ear and mastoid cholesteatoma      - Followed by ENT (Dr. Serena Colonel) who recommended tympanomastoidectomy back in 06/21/19; he has been unable to complete this surgery d/t sevral cancellations d/t low sodium levels     - 3/26: Spoke with his ENT (Dr. Margo Aye). There are no abx needed at discharge. He will need to maintain a Na+ level of 130 or above to have anesthesia comfortable with general sedation for his procedure.  Discharge Instructions  Discharge Instructions    AMB Referral to Cardiac Rehabilitation - Phase II   Complete by: As directed    Partial successful angioplasty. Needs staged procedure   Diagnosis:  Coronary Stents NSTEMI     After initial evaluation and assessments completed: Virtual Based Care may be provided alone or in conjunction with Phase 2 Cardiac Rehab based on patient barriers.: Yes     Allergies as of 10/26/2019   No Known Allergies     Medication List    STOP taking these medications   amLODipine 10 MG tablet Commonly known as: NORVASC     TAKE these medications   aspirin 81 MG EC tablet Take 1 tablet (81 mg total) by mouth daily.    atorvastatin 80 MG tablet Commonly known as: LIPITOR Take 1 tablet (80 mg total) by mouth daily at 6 PM.   lisinopril 10 MG tablet Commonly known as: ZESTRIL Take 1 tablet (10 mg total) by mouth every evening.   metoprolol succinate 25 MG 24 hr tablet Commonly known as: TOPROL-XL Take 1 tablet (25 mg total) by mouth every morning.   nitroGLYCERIN 0.4 MG SL tablet Commonly known as: Nitrostat Place 1 tablet (0.4 mg total) under the tongue every 5 (five) minutes as needed for chest pain. Take for up to 3 doses. Call physician.   ticagrelor 90 MG Tabs tablet Commonly known as: BRILINTA Take 1 tablet (90 mg total) by mouth 2 (two) times daily.      Follow-up Information    [name], MD Follow up.   Specialty: Internal Medicine Contact information: [address] [place] [facility] [address] [phone]        Serena Colonel, MD Follow up  in 2 week(s).   Specialty: Otolaryngology Contact information: [address] [place] [address] [phone]          No Known Allergies  Consultations:  Cardiology  Procedures/Studies: DG Chest 2 View  Result Date: 10/24/2019 CLINICAL DATA:  Chest pain, shortness of breath since last evening EXAM: CHEST - 2 VIEW COMPARISON:  12/21/2018 FINDINGS: Frontal and lateral views of the chest demonstrate an unremarkable cardiac silhouette. The lungs are hyperinflated with background emphysema unchanged. No airspace disease, effusion, or pneumothorax. No acute bony abnormalities. IMPRESSION: 1. Background emphysema.  No acute process. Electronically Signed   By: Sharlet Salina M.D.   On: 10/24/2019 17:54   CARDIAC CATHETERIZATION  Addendum Date: 10/26/2019   LM: Distal 30%. dFR 1.0 LAD: Distal 30% stenosis Lcx: Severe tortuosity with moderate calcification.         Pros 60%. Mid 80-90% stenosis at LCx/OM         bifurcation ([place] 1,1,0) Ostial OM 40% stenosis. RCA: Long diffuse prox-mid 80% stenosis.          Distal  RCA 40% stenosis          PDA diffuse 60% stenosis. Complex procedure due to severity or tortuosity and calcification. Partially successful cutting balloon angioplasty prox-mid LCx Residual prox 40%, mid 40% stenosis at LCx/Om bifurcation, 60% calcific stenosis distal to bifurcation. This lesion remains inadequately prepared for stenting due to severe calcification. Recommendation: Staging the procedure with consideration for femoral access and atherectomy before stenting, followed by PCI to RCA. Continue DAPT with Aspirin and Brilinta. [name], MD [facility] [place] Pager: [phone] Office: [phone]    Result Date: 10/26/2019 LM: Distal 30%. dFR 1.0 LAD: Distal 30% stenosis Lcx: Severe tortuosity with moderate calcification.         Pros 60%. Mid 80-90% stenosis at LCx/OM         bifurcation ([place] 1,1,0) Ostial OM 40% stenosis. RCA: Long diffuse prox-mid 80% stenosis.          Distal RCA 40% stenosis          PDA diffuse 60% stenosis. Complex procedure due to severity or tortuosity and calcification. Partially successful cutting balloon angioplasty prox-mid LCx Residual prox 40%, mid 40% stenosis at LCx/Om bifurcation, 60% calcific stenosis distal to bifurcation. This lesion remains inadequately prepared for stenting due to severe calcification. Recommendation: Staging the procedure with consideration for femoral access and atherectomy before stenting, followed by PCI to RCA. Continue DAPT with Aspirin and Brilinta. [name], MD [facility] [place] Pager: [phone] Office: [phone]    ECHOCARDIOGRAM COMPLETE  Result Date: 10/25/2019    ECHOCARDIOGRAM REPORT   Patient Name:   WANDELL SCULLION Date of Exam: 10/25/2019 Medical Rec #:  [id number]     Height:       72.0 in Accession #:    [id number]    Weight:       155.0 lb Date of Birth:  Jun 23, 1956     BSA:          1.912 m Patient Age:    63 years      BP:           131/74 mmHg Patient Gender: M              HR:           66 bpm. Exam Location:  Inpatient Procedure: 2D Echo, Cardiac Doppler and Color Doppler Indications:    121-121.4 ST elevation (STEMI) myocardial  infarction  History:        Patient has no prior history of Echocardiogram examinations.                 Risk Factors:Hypertension and Current Smoker.  Sonographer:    Tiffany Dance Referring Phys: [id number] Tessa Lerner IMPRESSIONS  1. Normal LV systolic function; grade 1 diastolic dysfunction.  2. Left ventricular ejection fraction, by estimation, is 60 to 65%. The left ventricle has normal function. The left ventricle has no regional wall motion abnormalities. Left ventricular diastolic parameters are consistent with Grade I diastolic dysfunction (impaired relaxation).  3. Right ventricular systolic function is normal. The right ventricular size is normal.  4. The mitral valve is normal in structure. Trivial mitral valve regurgitation. No evidence of mitral stenosis.  5. The aortic valve is tricuspid. Aortic valve regurgitation is not visualized. Mild aortic valve sclerosis is present, with no evidence of aortic valve stenosis.  6. The inferior vena cava is normal in size with greater than 50% respiratory variability, suggesting right atrial pressure of 3 mmHg. FINDINGS  Left Ventricle: Left ventricular ejection fraction, by estimation, is 60 to 65%. The left ventricle has normal function. The left ventricle has no regional wall motion abnormalities. The left ventricular internal cavity size was normal in size. There is  no left ventricular hypertrophy. Left ventricular diastolic parameters are consistent with Grade I diastolic dysfunction (impaired relaxation). Right Ventricle: The right ventricular size is normal. Right ventricular systolic function is normal. Left Atrium: Left atrial size was normal in size. Right Atrium: Right atrial size was normal in size. Pericardium: There is no evidence of pericardial effusion. Mitral Valve: The mitral valve is  normal in structure. Normal mobility of the mitral valve leaflets. Trivial mitral valve regurgitation. No evidence of mitral valve stenosis. Tricuspid Valve: The tricuspid valve is normal in structure. Tricuspid valve regurgitation is trivial. No evidence of tricuspid stenosis. Aortic Valve: The aortic valve is tricuspid. Aortic valve regurgitation is not visualized. Mild aortic valve sclerosis is present, with no evidence of aortic valve stenosis. Pulmonic Valve: The pulmonic valve was not well visualized. Pulmonic valve regurgitation is not visualized. No evidence of pulmonic stenosis. Aorta: The aortic root is normal in size and structure. Venous: The inferior vena cava is normal in size with greater than 50% respiratory variability, suggesting right atrial pressure of 3 mmHg.  Additional Comments: Normal LV systolic function; grade 1 diastolic dysfunction.  LEFT VENTRICLE PLAX 2D LVIDd:         4.06 cm  Diastology LVIDs:         2.70 cm  LV e' lateral:   8.81 cm/s LV PW:         1.04 cm  LV E/e' lateral: 7.9 LV IVS:        0.99 cm  LV e' medial:    6.85 cm/s LVOT diam:     2.10 cm  LV E/e' medial:  10.1 LV SV:         78 LV SV Index:   41 LVOT Area:     3.46 cm  RIGHT VENTRICLE             IVC RV Basal diam:  2.77 cm     IVC diam: 1.48 cm RV S prime:     11.00 cm/s TAPSE (M-mode): 2.3 cm LEFT ATRIUM             Index       RIGHT ATRIUM  Index LA diam:        4.10 cm 2.14 cm/m  RA Area:     14.70 cm LA Vol (A2C):   51.4 ml 26.89 ml/m RA Volume:   36.60 ml  19.14 ml/m LA Vol (A4C):   35.9 ml 18.78 ml/m LA Biplane Vol: 45.1 ml 23.59 ml/m  AORTIC VALVE LVOT Vmax:   112.00 cm/s LVOT Vmean:  62.700 cm/s LVOT VTI:    0.226 m  AORTA Ao Root diam: 3.50 cm Ao Asc diam:  3.60 cm MITRAL VALVE MV Area (PHT): 2.45 cm    SHUNTS MV Decel Time: 310 msec    Systemic VTI:  0.23 m MV E velocity: 69.40 cm/s  Systemic Diam: 2.10 cm MV A velocity: 81.00 cm/s MV E/A ratio:  0.86 Olga Millers MD Electronically signed  by Olga Millers MD Signature Date/Time: 10/25/2019/4:46:34 PM    Final    CT Angio Chest/Abd/Pel for Dissection W and/or Wo Contrast  Result Date: 10/24/2019 CLINICAL DATA:  64 year old male with chest pain and shortness of breath. EXAM: CT ANGIOGRAPHY CHEST, ABDOMEN AND PELVIS TECHNIQUE: Multidetector CT imaging through the chest, abdomen and pelvis was performed using the standard protocol during bolus administration of intravenous contrast. Multiplanar reconstructed images and MIPs were obtained and reviewed to evaluate the vascular anatomy. CONTRAST:  OMNIPAQUE IOHEXOL 350 MG/ML SOLN COMPARISON:  CT abdomen pelvis dated 10/14/2013 and chest radiograph dated 10/24/2019. FINDINGS: CTA CHEST FINDINGS Cardiovascular: There is no cardiomegaly or pericardial effusion. Coronary vascular calcification primarily involving RCA and left circumflex artery. Minimal atherosclerotic calcification of the thoracic aorta. No aneurysmal dilatation or dissection. The origins of the great vessels of the aortic arch appear patent. No pulmonary artery embolus identified. Mediastinum/Nodes: No hilar or mediastinal adenopathy. The esophagus and the thyroid gland are grossly unremarkable. No mediastinal fluid collection. Lungs/Pleura: Moderate centrilobular emphysema. No focal consolidation, pleural effusion, pneumothorax. There is a 6 mm right lower lobe nodule (series 9, image 100). There is a 5 mm right apical nodule or a focus of scarring/parenchymal confluence. The central airways are patent. Musculoskeletal: Degenerative changes of the spine. No acute osseous pathology. Review of the MIP images confirms the above findings. CTA ABDOMEN AND PELVIS FINDINGS VASCULAR Aorta: Moderate atherosclerotic calcification. No aneurysmal dilatation or dissection. Celiac: There is focal area of narrowing of the origin of the celiac axis. The celiac artery and its major branches remain patent. SMA: Patent without evidence of aneurysm,  dissection, vasculitis or significant stenosis. Renals: Both renal arteries are patent without evidence of aneurysm, dissection, vasculitis, fibromuscular dysplasia or significant stenosis. IMA: Patent without evidence of aneurysm, dissection, vasculitis or significant stenosis. Inflow: Moderate atherosclerotic disease. No aneurysmal dilatation or dissection. The iliac arteries remain patent. Veins: No obvious venous abnormality within the limitations of this arterial phase study. Review of the MIP images confirms the above findings. NON-VASCULAR No intra-abdominal free air or free fluid. Hepatobiliary: The liver is unremarkable. No intrahepatic biliary ductal dilatation. Cholecystectomy. Pancreas: Unremarkable. No pancreatic ductal dilatation or surrounding inflammatory changes. Spleen: Normal in size without focal abnormality. Adrenals/Urinary Tract: The adrenal glands, kidneys, visualized ureters, and urinary bladder appear unremarkable. Stomach/Bowel: There is moderate stool throughout the colon. There is no bowel obstruction or active inflammation. The appendix is normal. Lymphatic: No adenopathy. Reproductive: Enlarged prostate gland measuring approximately 4.6 cm in transverse axial diameter. The seminal vesicles are symmetric. Other: None Musculoskeletal: Multilevel degenerative changes of the spine with endplate irregularity and disc space narrowing and bone spurring. No acute osseous  pathology. Review of the MIP images confirms the above findings. IMPRESSION: 1. No acute intrathoracic, abdominal, or pelvic pathology. No aortic aneurysm or dissection. No pulmonary artery embolus. 2. A 6 mm right lower lobe pulmonary nodule. Non-contrast chest CT at 6-12 months is recommended. If the nodule is stable at time of repeat CT, then future CT at 18-24 months (from today's scan) is considered optional for low-risk patients, but is recommended for high-risk patients. This recommendation follows the consensus  statement: Guidelines for Management of Incidental Pulmonary Nodules Detected on CT Images: From the Fleischner Society 2017; Radiology 2017; 284:228-243. 3. Aortic Atherosclerosis (ICD10-I70.0) and Emphysema (ICD10-J43.9). Electronically Signed   By: Elgie Collard M.D.   On: 10/24/2019 20:24      Subjective: "I get to go?"  Discharge Exam: Vitals:   10/26/19 0000 10/26/19 0457  BP: (!) 103/59 101/70  Pulse: 67 77  Resp:  18  Temp: 97.6 F (36.4 C) 98.4 F (36.9 C)  SpO2: 98% 100%   Vitals:   10/25/19 2000 10/25/19 2029 10/26/19 0000 10/26/19 0457  BP:  120/68 (!) 103/59 101/70  Pulse: 64 64 67 77  Resp:  18  18  Temp:  97.7 F (36.5 C) 97.6 F (36.4 C) 98.4 F (36.9 C)  TempSrc:  Oral Tympanic Oral  SpO2: 100% 100% 98% 100%  Weight:    67.8 kg  Height:       General: 64 y.o. male resting in bed in NAD Cardiovascular: RRR, +S1, S2, no m/g/r, equal pulses throughout Respiratory: CTABL, no w/r/r, normal WOB GI: BS+, NDNT, no masses noted, no organomegaly noted MSK: No e/c/c Skin: No rashes, bruises, ulcerations noted Neuro: A&O x 3, no focal deficits Psyc: Appropriate interaction and affect, calm/cooperative   The results of significant diagnostics from this hospitalization (including imaging, microbiology, ancillary and laboratory) are listed below for reference.     Microbiology: Recent Results (from the past 240 hour(s))  Respiratory Panel by RT PCR (Flu A&B, Covid) - Nasopharyngeal Swab     Status: None   Collection Time: 10/25/19  8:42 AM   Specimen: Nasopharyngeal Swab  Result Value Ref Range Status   SARS Coronavirus 2 by RT PCR NEGATIVE NEGATIVE Final    Comment: (NOTE) SARS-CoV-2 target nucleic acids are NOT DETECTED. The SARS-CoV-2 RNA is generally detectable in upper respiratoy specimens during the acute phase of infection. The lowest concentration of SARS-CoV-2 viral copies this assay can detect is 131 copies/mL. A negative result does not  preclude SARS-Cov-2 infection and should not be used as the sole basis for treatment or other patient management decisions. A negative result may occur with  improper specimen collection/handling, submission of specimen other than nasopharyngeal swab, presence of viral mutation(s) within the areas targeted by this assay, and inadequate number of viral copies (<131 copies/mL). A negative result must be combined with clinical observations, patient history, and epidemiological information. The expected result is Negative. Fact Sheet for Patients:  https://www.moore.com/ Fact Sheet for Healthcare Providers:  https://www.young.biz/ This test is not yet ap proved or cleared by the [place] FDA and  has been authorized for detection and/or diagnosis of SARS-CoV-2 by FDA under an Emergency Use Authorization (EUA). This EUA will remain  in effect (meaning this test can be used) for the duration of the COVID-19 declaration under Section 564(b)(1) of the Act, 21 U.S.C. section 360bbb-3(b)(1), unless the authorization is terminated or revoked sooner.    Influenza A by PCR NEGATIVE NEGATIVE Final   Influenza B  by PCR NEGATIVE NEGATIVE Final    Comment: (NOTE) The Xpert Xpress SARS-CoV-2/FLU/RSV assay is intended as an aid in  the diagnosis of influenza from Nasopharyngeal swab specimens and  should not be used as a sole basis for treatment. Nasal washings and  aspirates are unacceptable for Xpert Xpress SARS-CoV-2/FLU/RSV  testing. Fact Sheet for Patients: https://www.moore.com/ Fact Sheet for Healthcare Providers: https://www.young.biz/ This test is not yet approved or cleared by the [place] FDA and  has been authorized for detection and/or diagnosis of SARS-CoV-2 by  FDA under an Emergency Use Authorization (EUA). This EUA will remain  in effect (meaning this test can be used) for the duration of the   Covid-19 declaration under Section 564(b)(1) of the Act, 21  U.S.C. section 360bbb-3(b)(1), unless the authorization is  terminated or revoked. Performed at [facility], [address], [place], [place] [address]      Labs: BNP (last 3 results) No results for input(s): BNP in the last 8760 hours. Basic Metabolic Panel: Recent Labs  Lab 10/24/19 1743 10/25/19 0606 10/26/19 0358  NA 123* 127* 130*  K 3.8 4.2 4.3  CL 92* 97* 101  CO2 23 20* 22  GLUCOSE 121* 107* 107*  BUN <5* <5* 5*  CREATININE 0.62 0.70 0.88  CALCIUM 8.9 8.8* 8.7*  MG  --  1.8 1.9  PHOS  --  3.4  --    Liver Function Tests: Recent Labs  Lab 10/25/19 0606  AST 18  ALT 12  ALKPHOS 50  BILITOT 0.6  PROT 7.1  ALBUMIN 3.0*   No results for input(s): LIPASE, AMYLASE in the last 168 hours. No results for input(s): AMMONIA in the last 168 hours. CBC: Recent Labs  Lab 10/24/19 1743 10/25/19 0220 10/26/19 0358  WBC 6.6 6.0 5.4  HGB 14.7 13.6 12.2*  HCT 42.8 39.8 35.6*  MCV 83.1 83.1 83.2  PLT 331 324 272   Cardiac Enzymes: No results for input(s): CKTOTAL, CKMB, CKMBINDEX, TROPONINI in the last 168 hours. BNP: Invalid input(s): POCBNP CBG: No results for input(s): GLUCAP in the last 168 hours. D-Dimer No results for input(s): DDIMER in the last 72 hours. Hgb A1c Recent Labs    10/25/19 0220  HGBA1C 6.3*   Lipid Profile Recent Labs    10/25/19 0606  CHOL 140  HDL 49  LDLCALC 81  TRIG 50  CHOLHDL 2.9   Thyroid function studies Recent Labs    10/25/19 0930  TSH 2.763   Anemia work up No results for input(s): VITAMINB12, FOLATE, FERRITIN, TIBC, IRON, RETICCTPCT in the last 72 hours. Urinalysis    Component Value Date/Time   COLORURINE YELLOW 11/05/2014 1025   APPEARANCEUR CLEAR 11/05/2014 1025   LABSPEC 1.010 11/05/2014 1025   PHURINE 5.5 11/05/2014 1025   GLUCOSEU NEGATIVE 11/05/2014 1025   HGBUR TRACE (A) 11/05/2014 1025   BILIRUBINUR NEGATIVE 11/05/2014 1025    KETONESUR NEGATIVE 11/05/2014 1025   PROTEINUR TRACE (A) 11/05/2014 1025   UROBILINOGEN 0.2 11/05/2014 1025   NITRITE NEGATIVE 11/05/2014 1025   LEUKOCYTESUR TRACE (A) 11/05/2014 1025   Sepsis Labs Invalid input(s): PROCALCITONIN,  WBC,  LACTICIDVEN Microbiology Recent Results (from the past 240 hour(s))  Respiratory Panel by RT PCR (Flu A&B, Covid) - Nasopharyngeal Swab     Status: None   Collection Time: 10/25/19  8:42 AM   Specimen: Nasopharyngeal Swab  Result Value Ref Range Status   SARS Coronavirus 2 by RT PCR NEGATIVE NEGATIVE Final    Comment: (  NOTE) SARS-CoV-2 target nucleic acids are NOT DETECTED. The SARS-CoV-2 RNA is generally detectable in upper respiratoy specimens during the acute phase of infection. The lowest concentration of SARS-CoV-2 viral copies this assay can detect is 131 copies/mL. A negative result does not preclude SARS-Cov-2 infection and should not be used as the sole basis for treatment or other patient management decisions. A negative result may occur with  improper specimen collection/handling, submission of specimen other than nasopharyngeal swab, presence of viral mutation(s) within the areas targeted by this assay, and inadequate number of viral copies (<131 copies/mL). A negative result must be combined with clinical observations, patient history, and epidemiological information. The expected result is Negative. Fact Sheet for Patients:  https://www.moore.com/ Fact Sheet for Healthcare Providers:  https://www.young.biz/ This test is not yet ap proved or cleared by the [place] FDA and  has been authorized for detection and/or diagnosis of SARS-CoV-2 by FDA under an Emergency Use Authorization (EUA). This EUA will remain  in effect (meaning this test can be used) for the duration of the COVID-19 declaration under Section 564(b)(1) of the Act, 21 U.S.C. section 360bbb-3(b)(1), unless the authorization is  terminated or revoked sooner.    Influenza A by PCR NEGATIVE NEGATIVE Final   Influenza B by PCR NEGATIVE NEGATIVE Final    Comment: (NOTE) The Xpert Xpress SARS-CoV-2/FLU/RSV assay is intended as an aid in  the diagnosis of influenza from Nasopharyngeal swab specimens and  should not be used as a sole basis for treatment. Nasal washings and  aspirates are unacceptable for Xpert Xpress SARS-CoV-2/FLU/RSV  testing. Fact Sheet for Patients: https://www.moore.com/ Fact Sheet for Healthcare Providers: https://www.young.biz/ This test is not yet approved or cleared by the [place] FDA and  has been authorized for detection and/or diagnosis of SARS-CoV-2 by  FDA under an Emergency Use Authorization (EUA). This EUA will remain  in effect (meaning this test can be used) for the duration of the  Covid-19 declaration under Section 564(b)(1) of the Act, 21  U.S.C. section 360bbb-3(b)(1), unless the authorization is  terminated or revoked. Performed at [facility], [address], [place], [place] [address]      Time coordinating discharge: 35 minutes  SIGNED:   Teddy Spike, DO  [facility] 10/26/2019, 8:47 AM   If 7PM-7AM, please contact night-coverage [email or web address]

## 2019-10-26 NOTE — Progress Notes (Signed)
CARDIAC REHAB PHASE I   PRE:  Rate/Rhythm: 66 SR  BP:  Supine: 120/73  Sitting:   Standing:    SaO2: 99%RA  MODE:  Ambulation: 50 ft   POST:  Rate/Rhythm: 72 SR  BP:  Supine:   Sitting: 137/83  Standing:    SaO2: 100%RA 0903-1005 Pt walked 50 ft on RA with gait belt use, rolling walker and asst x 1 due to weak legs. Pt stated he has back issues and has not walked in last couple of days and legs stiff. Uses cane at home sometimes. No CP. MI education completed with pt who voiced understanding. May need some reinforcement. Pt stated he sometimes begins alcohol in the morning. Eats very little. Discussed with pt that he was drinking his calories instead of eating. He has not been able to gain weight, have ear surgery due to low sodium and his A1C is at 6.3. Discussed these as some of the reasons to quit alcohol in addition to how it affects heart. Also discussed how he needs to quit smoking. Pt stated that these go hand in hand as one makes him want to do the other. He did quit smoking once for three months. Gave smoking cessation handout and encouraged him to call 1800quitnow. Discussed heart healthy food choices, how to take NTG if needed and discussed CRP 2. Referral to [facility] but pt knows he cannot attend until staged PCI. Did not give ex ed as pt walked only short distance before needing to sit. Encouragement given for pt to make changes. Told pt will follow up with him after staged PCI to see what progress he has made.   Luetta Nutting, RN BSN  10/26/2019 9:56 AM

## 2019-10-26 NOTE — Progress Notes (Signed)
Progress Note  Patient Name: Michael Rivas Date of Encounter: 10/26/2019  Attending physician: Teddy Spike, DO Primary care provider: [name], MD Primary Cardiologist: NA Consultant:Gerber Penza Odis Hollingshead  Subjective: Michael Rivas is a 64 y.o. male who presented to the hospital with a chief complaint of " chest pain."  Patient was diagnosed with non-STEMI and was taken to Cath Lab yesterday 10/25/2019.  He was noted to have coronary artery disease as described below.  No acute postprocedural complications noted.  Patient is resting comfortably.  No discomfort. Patient denies angina, no shortness of breath at rest or with effort related activities, no orthopnea, paroxysmal nocturnal dyspnea or lower extremity swelling. Case discussed and reviewed with his nurse.  Objective: Vital Signs in the last 24 hours: Temp:  [97.6 F (36.4 C)-98.4 F (36.9 C)] 98.4 F (36.9 C) (03/26 0457) Pulse Rate:  [53-77] 77 (03/26 0457) Resp:  [10-18] 18 (03/26 0457) BP: (99-178)/(59-88) 101/70 (03/26 0457) SpO2:  [98 %-100 %] 100 % (03/26 0457) Weight:  [67.8 kg] 67.8 kg (03/26 0457)  Intake/Output from previous day: 03/25 0701 - 03/26 0700 In: 1136.1 [P.O.:240; I.V.:896.1] Out: 1225 [Urine:1225]  Weights:  Filed Weights   10/24/19 1729 10/26/19 0457  Weight: 70.3 kg 67.8 kg    Telemetry: Personally reviewed. NSR without ectopy.   Physical examination: CONSTITUTIONAL: Well-developed and well-nourished. No acute distress.  SKIN: Skin is warm and dry. No rash noted. No cyanosis. No pallor. No jaundice HEAD: Normocephalic and atraumatic.  EYES: No scleral icterus MOUTH/THROAT: Moist oral membranes.  NECK: No JVD present. No thyromegaly noted. No carotid bruits  LYMPHATIC: No visible cervical adenopathy.  CHEST Normal respiratory effort. No intercostal retractions  LUNGS: Clear to auscultation bilaterally.  No stridor. No wheezes. No rales.  CARDIOVASCULAR: Regular rate and rhythm, positive  S1-S2, no murmurs rubs or gallops appreciated. ABDOMINAL: Soft,  nontender, nondistended, positive bowel sounds in all 4 quadrants.  No apparent ascites.  EXTREMITIES: No peripheral edema  HEMATOLOGIC: No significant bruising NEUROLOGIC: Oriented to person, place, and time. Nonfocal. Normal muscle tone.  PSYCHIATRIC: Normal mood and affect. Normal behavior. Cooperative  Lab Results: Hematology Recent Labs  Lab 10/24/19 1743 10/25/19 0220 10/26/19 0358  WBC 6.6 6.0 5.4  RBC 5.15 4.79 4.28  HGB 14.7 13.6 12.2*  HCT 42.8 39.8 35.6*  MCV 83.1 83.1 83.2  MCH 28.5 28.4 28.5  MCHC 34.3 34.2 34.3  RDW 13.8 13.8 14.0  PLT 331 324 272    Chemistry Recent Labs  Lab 10/24/19 1743 10/25/19 0606 10/26/19 0358  NA 123* 127* 130*  K 3.8 4.2 4.3  CL 92* 97* 101  CO2 23 20* 22  GLUCOSE 121* 107* 107*  BUN <5* <5* 5*  CREATININE 0.62 0.70 0.88  CALCIUM 8.9 8.8* 8.7*  PROT  --  7.1  --   ALBUMIN  --  3.0*  --   AST  --  18  --   ALT  --  12  --   ALKPHOS  --  50  --   BILITOT  --  0.6  --   GFRNONAA >60 >60 >60  GFRAA >60 >60 >60  ANIONGAP 8 10 7      Hepatic Function Panel  Recent Labs    10/25/19 0606  PROT 7.1  ALBUMIN 3.0*  AST 18  ALT 12  ALKPHOS 50  BILITOT 0.6    Cardiac Enzymes: High sensitive troponin: 10/24/2019 1743: 149 10/24/2019 1939: 116  BNPNo results for input(s): BNP, PROBNP in  the last 168 hours.   DDimer No results for input(s): DDIMER in the last 168 hours.   Hemoglobin A1c:  Lab Results  Component Value Date   HGBA1C 6.3 (H) 10/25/2019   MPG 134.11 10/25/2019    TSH  Recent Labs    10/25/19 0930  TSH 2.763    Lipid Panel     Component Value Date/Time   CHOL 140 10/25/2019 0606   TRIG 50 10/25/2019 0606   HDL 49 10/25/2019 0606   CHOLHDL 2.9 10/25/2019 0606   VLDL 10 10/25/2019 0606   LDLCALC 81 10/25/2019 0606    Imaging: DG Chest 2 View  Result Date: 10/24/2019 CLINICAL DATA:  Chest pain, shortness of breath since last  evening EXAM: CHEST - 2 VIEW COMPARISON:  12/21/2018 FINDINGS: Frontal and lateral views of the chest demonstrate an unremarkable cardiac silhouette. The lungs are hyperinflated with background emphysema unchanged. No airspace disease, effusion, or pneumothorax. No acute bony abnormalities. IMPRESSION: 1. Background emphysema.  No acute process. Electronically Signed   By: Sharlet Salina M.D.   On: 10/24/2019 17:54   CARDIAC CATHETERIZATION  Addendum Date: 10/26/2019   LM: Distal 30%. dFR 1.0 LAD: Distal 30% stenosis Lcx: Severe tortuosity with moderate calcification.         Pros 60%. Mid 80-90% stenosis at LCx/OM         bifurcation ([place] 1,1,0) Ostial OM 40% stenosis. RCA: Long diffuse prox-mid 80% stenosis.          Distal RCA 40% stenosis          PDA diffuse 60% stenosis. Complex procedure due to severity or tortuosity and calcification. Partially successful cutting balloon angioplasty prox-mid LCx Residual prox 40%, mid 40% stenosis at LCx/Om bifurcation, 60% calcific stenosis distal to bifurcation. This lesion remains inadequately prepared for stenting due to severe calcification. Recommendation: Staging the procedure with consideration for femoral access and atherectomy before stenting, followed by PCI to RCA. Continue DAPT with Aspirin and Brilinta. [name], MD [facility] [place] Pager: [phone] Office: [phone]    Result Date: 10/26/2019 LM: Distal 30%. dFR 1.0 LAD: Distal 30% stenosis Lcx: Severe tortuosity with moderate calcification.         Pros 60%. Mid 80-90% stenosis at LCx/OM         bifurcation ([place] 1,1,0) Ostial OM 40% stenosis. RCA: Long diffuse prox-mid 80% stenosis.          Distal RCA 40% stenosis          PDA diffuse 60% stenosis. Complex procedure due to severity or tortuosity and calcification. Partially successful cutting balloon angioplasty prox-mid LCx Residual prox 40%, mid 40% stenosis at LCx/Om bifurcation, 60% calcific stenosis distal to  bifurcation. This lesion remains inadequately prepared for stenting due to severe calcification. Recommendation: Staging the procedure with consideration for femoral access and atherectomy before stenting, followed by PCI to RCA. Continue DAPT with Aspirin and Brilinta. [name], MD [facility] [place] Pager: [phone] Office: [phone]    ECHOCARDIOGRAM COMPLETE  Result Date: 10/25/2019    ECHOCARDIOGRAM REPORT   Patient Name:   DEMARQUS GOON Date of Exam: 10/25/2019 Medical Rec #:  [id number]     Height:       72.0 in Accession #:    [id number]    Weight:       155.0 lb Date of Birth:  [birth year]     BSA:          1.912 m Patient Age:  63 years      BP:           131/74 mmHg Patient Gender: M             HR:           66 bpm. Exam Location:  Inpatient Procedure: 2D Echo, Cardiac Doppler and Color Doppler Indications:    121-121.4 ST elevation (STEMI) myocardial infarction  History:        Patient has no prior history of Echocardiogram examinations.                 Risk Factors:Hypertension and Current Smoker.  Sonographer:    Tiffany Dance Referring Phys: [id number] Tessa Lerner IMPRESSIONS  1. Normal LV systolic function; grade 1 diastolic dysfunction.  2. Left ventricular ejection fraction, by estimation, is 60 to 65%. The left ventricle has normal function. The left ventricle has no regional wall motion abnormalities. Left ventricular diastolic parameters are consistent with Grade I diastolic dysfunction (impaired relaxation).  3. Right ventricular systolic function is normal. The right ventricular size is normal.  4. The mitral valve is normal in structure. Trivial mitral valve regurgitation. No evidence of mitral stenosis.  5. The aortic valve is tricuspid. Aortic valve regurgitation is not visualized. Mild aortic valve sclerosis is present, with no evidence of aortic valve stenosis.  6. The inferior vena cava is normal in size with greater than 50% respiratory variability, suggesting  right atrial pressure of 3 mmHg. FINDINGS  Left Ventricle: Left ventricular ejection fraction, by estimation, is 60 to 65%. The left ventricle has normal function. The left ventricle has no regional wall motion abnormalities. The left ventricular internal cavity size was normal in size. There is  no left ventricular hypertrophy. Left ventricular diastolic parameters are consistent with Grade I diastolic dysfunction (impaired relaxation). Right Ventricle: The right ventricular size is normal. Right ventricular systolic function is normal. Left Atrium: Left atrial size was normal in size. Right Atrium: Right atrial size was normal in size. Pericardium: There is no evidence of pericardial effusion. Mitral Valve: The mitral valve is normal in structure. Normal mobility of the mitral valve leaflets. Trivial mitral valve regurgitation. No evidence of mitral valve stenosis. Tricuspid Valve: The tricuspid valve is normal in structure. Tricuspid valve regurgitation is trivial. No evidence of tricuspid stenosis. Aortic Valve: The aortic valve is tricuspid. Aortic valve regurgitation is not visualized. Mild aortic valve sclerosis is present, with no evidence of aortic valve stenosis. Pulmonic Valve: The pulmonic valve was not well visualized. Pulmonic valve regurgitation is not visualized. No evidence of pulmonic stenosis. Aorta: The aortic root is normal in size and structure. Venous: The inferior vena cava is normal in size with greater than 50% respiratory variability, suggesting right atrial pressure of 3 mmHg.  Additional Comments: Normal LV systolic function; grade 1 diastolic dysfunction.  LEFT VENTRICLE PLAX 2D LVIDd:         4.06 cm  Diastology LVIDs:         2.70 cm  LV e' lateral:   8.81 cm/s LV PW:         1.04 cm  LV E/e' lateral: 7.9 LV IVS:        0.99 cm  LV e' medial:    6.85 cm/s LVOT diam:     2.10 cm  LV E/e' medial:  10.1 LV SV:         78 LV SV Index:   41 LVOT Area:     3.46 cm  RIGHT VENTRICLE              IVC RV Basal diam:  2.77 cm     IVC diam: 1.48 cm RV S prime:     11.00 cm/s TAPSE (M-mode): 2.3 cm LEFT ATRIUM             Index       RIGHT ATRIUM           Index LA diam:        4.10 cm 2.14 cm/m  RA Area:     14.70 cm LA Vol (A2C):   51.4 ml 26.89 ml/m RA Volume:   36.60 ml  19.14 ml/m LA Vol (A4C):   35.9 ml 18.78 ml/m LA Biplane Vol: 45.1 ml 23.59 ml/m  AORTIC VALVE LVOT Vmax:   112.00 cm/s LVOT Vmean:  62.700 cm/s LVOT VTI:    0.226 m  AORTA Ao Root diam: 3.50 cm Ao Asc diam:  3.60 cm MITRAL VALVE MV Area (PHT): 2.45 cm    SHUNTS MV Decel Time: 310 msec    Systemic VTI:  0.23 m MV E velocity: 69.40 cm/s  Systemic Diam: 2.10 cm MV A velocity: 81.00 cm/s MV E/A ratio:  0.86 Kirk Ruths MD Electronically signed by Kirk Ruths MD Signature Date/Time: 10/25/2019/4:46:34 PM    Final    CT Angio Chest/Abd/Pel for Dissection W and/or Wo Contrast  Result Date: 10/24/2019 CLINICAL DATA:  64 year old male with chest pain and shortness of breath. EXAM: CT ANGIOGRAPHY CHEST, ABDOMEN AND PELVIS TECHNIQUE: Multidetector CT imaging through the chest, abdomen and pelvis was performed using the standard protocol during bolus administration of intravenous contrast. Multiplanar reconstructed images and MIPs were obtained and reviewed to evaluate the vascular anatomy. CONTRAST:  168mL OMNIPAQUE IOHEXOL 350 MG/ML SOLN COMPARISON:  CT abdomen pelvis dated 10/14/2013 and chest radiograph dated 10/24/2019. FINDINGS: CTA CHEST FINDINGS Cardiovascular: There is no cardiomegaly or pericardial effusion. Coronary vascular calcification primarily involving RCA and left circumflex artery. Minimal atherosclerotic calcification of the thoracic aorta. No aneurysmal dilatation or dissection. The origins of the great vessels of the aortic arch appear patent. No pulmonary artery embolus identified. Mediastinum/Nodes: No hilar or mediastinal adenopathy. The esophagus and the thyroid gland are grossly unremarkable. No mediastinal  fluid collection. Lungs/Pleura: Moderate centrilobular emphysema. No focal consolidation, pleural effusion, pneumothorax. There is a 6 mm right lower lobe nodule (series 9, image 100). There is a 5 mm right apical nodule or a focus of scarring/parenchymal confluence. The central airways are patent. Musculoskeletal: Degenerative changes of the spine. No acute osseous pathology. Review of the MIP images confirms the above findings. CTA ABDOMEN AND PELVIS FINDINGS VASCULAR Aorta: Moderate atherosclerotic calcification. No aneurysmal dilatation or dissection. Celiac: There is focal area of narrowing of the origin of the celiac axis. The celiac artery and its major branches remain patent. SMA: Patent without evidence of aneurysm, dissection, vasculitis or significant stenosis. Renals: Both renal arteries are patent without evidence of aneurysm, dissection, vasculitis, fibromuscular dysplasia or significant stenosis. IMA: Patent without evidence of aneurysm, dissection, vasculitis or significant stenosis. Inflow: Moderate atherosclerotic disease. No aneurysmal dilatation or dissection. The iliac arteries remain patent. Veins: No obvious venous abnormality within the limitations of this arterial phase study. Review of the MIP images confirms the above findings. NON-VASCULAR No intra-abdominal free air or free fluid. Hepatobiliary: The liver is unremarkable. No intrahepatic biliary ductal dilatation. Cholecystectomy. Pancreas: Unremarkable. No pancreatic ductal dilatation or surrounding inflammatory changes. Spleen: Normal in size without focal abnormality.  Adrenals/Urinary Tract: The adrenal glands, kidneys, visualized ureters, and urinary bladder appear unremarkable. Stomach/Bowel: There is moderate stool throughout the colon. There is no bowel obstruction or active inflammation. The appendix is normal. Lymphatic: No adenopathy. Reproductive: Enlarged prostate gland measuring approximately 4.6 cm in transverse axial  diameter. The seminal vesicles are symmetric. Other: None Musculoskeletal: Multilevel degenerative changes of the spine with endplate irregularity and disc space narrowing and bone spurring. No acute osseous pathology. Review of the MIP images confirms the above findings. IMPRESSION: 1. No acute intrathoracic, abdominal, or pelvic pathology. No aortic aneurysm or dissection. No pulmonary artery embolus. 2. A 6 mm right lower lobe pulmonary nodule. Non-contrast chest CT at 6-12 months is recommended. If the nodule is stable at time of repeat CT, then future CT at 18-24 months (from today's scan) is considered optional for low-risk patients, but is recommended for high-risk patients. This recommendation follows the consensus statement: Guidelines for Management of Incidental Pulmonary Nodules Detected on CT Images: From the Fleischner Society 2017; Radiology 2017; 284:228-243. 3. Aortic Atherosclerosis (ICD10-I70.0) and Emphysema (ICD10-J43.9). Electronically Signed   By: Elgie Collard M.D.   On: 10/24/2019 20:24    Cardiac database: EKG: 10/24/2019 1726: Normal sinus rhythm ventricular rate of 79 bpm, rightward axis, biatrial enlargement, old anteroseptal infarct, poor R wave progression, ST depressions in the inferior and lateral leads, without evidence of myocardial injury pattern.  10/25/2019 821: Normal sinus rhythm ventricular rate of 64 bpm, old anteroseptal infarct, ST depressions in the inferior lateral leads have improved compared to prior EKG.  No evidence of myocardial injury pattern.  Echocardiogram: 10/25/2019: LVEF 60-65%, grade 1 diastolic impairment, no regional wall motion abnormalities, right ventricular size and function normal.  Aortic valve sclerosis without stenosis and no other significant valvular heart disease.  Heart catheterization: 10/25/2019 Dr. Rosemary Holms: LM: Distal 30%. dFR 1.0 LAD: Distal 30% stenosis Lcx: Severe tortuosity with moderate calcification.          Pros  60%. Mid 80-90% stenosis at LCx/OM         bifurcation ([place] 1,1,0) Ostial OM 40% stenosis.  RCA: Long diffuse prox-mid 80% stenosis.          Distal RCA 40% stenosis          PDA diffuse 60% stenosis.   Complex procedure due to severity or tortuosity and calcification. Partially successful cutting balloon angioplasty prox-mid LCx Residual prox 40%, mid 40% stenosis at LCx/Om bifurcation, 60% calcific stenosis distal to bifurcation. This lesion remains inadequately prepared for stenting due to severe calcification.   Recommendation: Staging the procedure with consideration for femoral access and atherectomy before stenting, followed by PCI to RCA. Continue DAPT with Aspirin and Brilinta.  Scheduled Meds: . amLODipine  10 mg Oral Daily  . aspirin EC  81 mg Oral Daily  . atorvastatin  80 mg Oral q1800  . feeding supplement (ENSURE ENLIVE)  237 mL Oral BID BM  . folic acid  1 mg Oral Daily  . LORazepam  0-4 mg Oral Q6H   Followed by  . [START ON 10/27/2019] LORazepam  0-4 mg Oral Q12H  . multivitamin with minerals  1 tablet Oral Daily  . pantoprazole  40 mg Oral Daily  . sodium chloride flush  3 mL Intravenous Q12H  . thiamine  100 mg Oral Daily   Or  . thiamine  100 mg Intravenous Daily  . ticagrelor  90 mg Oral BID    Continuous Infusions: . sodium chloride    .  nitroGLYCERIN 5 mcg/min (10/26/19 0600)    PRN Meds: sodium chloride, acetaminophen, LORazepam **OR** LORazepam, ondansetron (ZOFRAN) IV, sodium chloride flush   IMPRESSION AND RECOMMENDATIONS: Baptiste Littler is a 64 y.o. male whose past medical history and cardiovascular risk factors include: Hypertension, active tobacco use, coronary artery disease status post angioplasty, prediabetes, pulmonary nodule.  PRIMARY DIAGNOSIS: Non-STEMI  His chest pain is consistent with angina pectoris, high sensitive troponins positive, initial EKG findings noted for ST depressions in inferolateral leads.  Patient underwent  left heart catheterization without any acute complications.  Findings noted above for further reference.  Echocardiogram results reviewed.  Continue aspirin 81 mg p.o. daily.  Continue Brilinta 90 mg p.o. twice daily status post angioplasty non-STEMI  Continue high intensity statin therapy.  Start metoprolol succinate 25 mg p.o. every morning  Recommend sublingual nitroglycerin tablets to be used on a as needed basis for chest pain.  The proper use and anticipated side effects of nitroglycerine has been carefully explained.  If a single episode of chest pain is not relieved by one tablet, the patient will try another within 5 minutes; and if this doesn't relieve the pain, the patient is instructed to call 911 for transportation to an emergency department.  Patient instructed not to use any phosphodiesterase 5 inhibitors such as Viagra/sildenafil, Cialis tadalafil, or Levitra/vardenafil because there are drug to drug interactions between this and nitro.  Patient verbalized understanding.  Patient needs follow-up once discharged with Dr. Rosemary Holms to discuss staged procedure to intervene on the lesions noted above.  Please see his recommendations from the left heart catheterization report.  Patient may be discharged home once he has his medications (aspirin/Brilinta/beta-blocker/statin therapy) at bedside prior to discharge.  Angina pectoris: Resolved  Established coronary artery disease with status post angioplasty:  Continue dual antiplatelet therapy.  Initiate beta-blockers.  Will discontinue Norvasc and add lisinopril 10 mg p.o. every afternoon in the setting of newly diagnosed CAD and prediabetes.  Continue high intensity statin therapy.  Educated on importance of better glycemic control.  Patient needs outpatient follow-up to discuss undergoing staged procedure as recommended by Dr. Rosemary Holms.   Elevated troponin: secondary to NSTEMI see above.   SECONDARY  DIAGNOSIS: Prediabetes: Currently managed per primary team  Benign essential hypertension: Currently managed by primary team.  Hyponatremia: Management per primary team.  Pulmonary nodule: Noted on CT scan performed in any pain, needs to be followed up as outpatient, patient informed and management primary team  Active tobacco use: Educated on importance of complete smoking cessation. Patient's questions and concerns were addressed to his satisfaction. He voices understanding of the instructions provided during this encounter.   This note was created using a voice recognition software as a result there may be grammatical errors inadvertently enclosed that do not reflect the nature of this encounter. Every attempt is made to correct such errors.  Tessa Lerner, DO, [facility] Office: [phone] 10/26/2019, 7:59 AM

## 2019-10-26 NOTE — Plan of Care (Signed)
  Problem: Education: Goal: Knowledge of General Education information will improve Description: Including pain rating scale, medication(s)/side effects and non-pharmacologic comfort measures Outcome: Adequate for Discharge   

## 2019-11-07 ENCOUNTER — Encounter: Payer: Self-pay | Admitting: Cardiology

## 2019-11-07 ENCOUNTER — Ambulatory Visit: Payer: PPO | Admitting: Cardiology

## 2019-11-07 ENCOUNTER — Other Ambulatory Visit: Payer: Self-pay

## 2019-11-07 VITALS — BP 134/82 | HR 70 | Temp 97.9°F | Resp 15 | Ht 74.0 in | Wt 152.0 lb

## 2019-11-07 DIAGNOSIS — F17210 Nicotine dependence, cigarettes, uncomplicated: Secondary | ICD-10-CM | POA: Diagnosis not present

## 2019-11-07 DIAGNOSIS — I25118 Atherosclerotic heart disease of native coronary artery with other forms of angina pectoris: Secondary | ICD-10-CM | POA: Diagnosis not present

## 2019-11-07 DIAGNOSIS — F17200 Nicotine dependence, unspecified, uncomplicated: Secondary | ICD-10-CM

## 2019-11-07 MED ORDER — CHANTIX STARTING MONTH PAK 0.5 MG X 11 & 1 MG X 42 PO TABS: ORAL_TABLET | ORAL | 0 refills | Status: DC

## 2019-11-07 NOTE — Progress Notes (Signed)
Follow up visit  Subjective:   Michael Rivas, male    DOB: 10-Feb-1956, 64 y.o.   MRN: [id number]    HPI  Chief Complaint  Patient presents with  . Hospitalization Follow-up    64 y.o. African American male with hypertension, hyperlipidemia, tobacco dependence, CAD, hyponatremia.  Patient was admitted with chest pain, found to have NSTEMI and underwent coronary angiography and partially successful angioplasty. Details below. He has had resolution of chest pain since then. He continues to have exertional dyspnea, but unfortunately continues to smoke.   Current Outpatient Medications on File Prior to Visit  Medication Sig Dispense Refill  . aspirin EC 81 MG EC tablet Take 1 tablet (81 mg total) by mouth daily. 30 tablet 1  . atorvastatin (LIPITOR) 80 MG tablet Take 1 tablet (80 mg total) by mouth daily at 6 PM. 30 tablet 1  . lisinopril (ZESTRIL) 10 MG tablet Take 1 tablet (10 mg total) by mouth every evening. 30 tablet 1  . metoprolol succinate (TOPROL-XL) 25 MG 24 hr tablet Take 1 tablet (25 mg total) by mouth every morning. 30 tablet 1  . nitroGLYCERIN (NITROSTAT) 0.4 MG SL tablet Place 1 tablet (0.4 mg total) under the tongue every 5 (five) minutes as needed for chest pain. Take for up to 3 doses. Call physician. 100 tablet 3  . ticagrelor (BRILINTA) 90 MG TABS tablet Take 1 tablet (90 mg total) by mouth 2 (two) times daily. 60 tablet 1   No current facility-administered medications on file prior to visit.    Cardiovascular & other pertient studies:  EKG 11/07/2019: Sinus rhythm 71 bpm. Old anterior infarct, occasional PVC.   Echocardiogram 10/25/2019: 1. Normal LV systolic function; grade 1 diastolic dysfunction.  2. Left ventricular ejection fraction, by estimation, is 60 to 65%. The  left ventricle has normal function. The left ventricle has no regional  wall motion abnormalities. Left ventricular diastolic parameters are  consistent with Grade I diastolic    dysfunction (impaired relaxation).  3. Right ventricular systolic function is normal. The right ventricular  size is normal.  4. The mitral valve is normal in structure. Trivial mitral valve  regurgitation. No evidence of mitral stenosis.  5. The aortic valve is tricuspid. Aortic valve regurgitation is not  visualized. Mild aortic valve sclerosis is present, with no evidence of  aortic valve stenosis.  6. The inferior vena cava is normal in size with greater than 50%  respiratory variability, suggesting right atrial pressure of 3 mmHg.   Coronary angiogram/intervention 10/25/2019: LM: Distal 30%. dFR 1.0 LAD: Distal 30% stenosis Lcx: Severe tortuosity with moderate calcification.          Pros 60%. Mid 80-90% stenosis at LCx/OM         bifurcation ([place] 1,1,0) Ostial OM 40% stenosis.  RCA: Long diffuse prox-mid 80% stenosis.          Distal RCA 40% stenosis          PDA diffuse 60% stenosis.   Complex procedure due to severity or tortuosity and calcification. Partially successful cutting balloon angioplasty prox-mid LCx Residual prox 40%, mid 40% stenosis at LCx/Om bifurcation, 60% calcific stenosis distal to bifurcation. This lesion remains inadequately prepared for stenting due to severe calcification.   Recommendation: Staging the procedure with consideration for femoral access and atherectomy before stenting, followed by PCI to RCA. Continue DAPT with Aspirin and Brilinta.   Recent labs: 10/26/2019: Glucose 107, BUN/Cr 5/0.88. EGFR >60. Na/K 130/4.3. Rest of the CMP  normal H/H 12/35. MCV 83. Platelets 272 HbA1C 6.3% Chol 140, TG 50, HDL 49, LDL 81 TSH 2.7 normal    Review of Systems  Cardiovascular: Positive for dyspnea on exertion. Negative for chest pain, leg swelling, palpitations and syncope.  Respiratory: Positive for shortness of breath.          Vitals:   11/07/19 1605  BP: 134/82  Pulse: 70  Resp: 15  Temp: 97.9 F (36.6 C)  SpO2: 97%     Body mass index is 19.52 kg/m. Filed Weights   11/07/19 1605  Weight: 152 lb (68.9 kg)     Objective:   Physical Exam  Constitutional: He appears well-developed and well-nourished.  Neck: No JVD present.  Cardiovascular: Normal rate, regular rhythm, normal heart sounds and intact distal pulses.  No murmur heard. Pulmonary/Chest: Effort normal and breath sounds normal. He has no wheezes. He has no rales.  Musculoskeletal:        General: No edema.  Nursing note and vitals reviewed.         Assessment & Recommendations:   64 y.o. African American male with hypertension, hyperlipidemia, tobacco dependence, CAD, hyponatremia.  CAD: Partially successful LCx intervention. Complex anatomy with severe tortuosity and calcification. Given that he has improvement in angina symptoms, will continue medical management for now with DAPT, metoprolol, lisinopril. If he has recurrence of symptoms, would consider coronary atherectomy with femoral approach.   Tobacco dependence: Tobacco cessation counseling: - Currently smoking 1 packs/day   - Patient was informed of the dangers of tobacco abuse including stroke, cancer, and MI, as well as benefits of tobacco cessation. - Patient is willing to quit at this time. - Approximately 5 mins were spent counseling patient cessation techniques. We discussed various methods to help quit smoking, including deciding on a date to quit, joining a support group, pharmacological agents. Patient would like to use Chantix. - I will reassess his progress at the next follow-up visit    Nigel Mormon, MD [facility] [place] Pager: [phone] Office: [phone]

## 2019-11-12 DIAGNOSIS — H7192 Unspecified cholesteatoma, left ear: Secondary | ICD-10-CM | POA: Diagnosis not present

## 2019-11-19 ENCOUNTER — Other Ambulatory Visit: Payer: Self-pay

## 2019-11-19 MED ORDER — METOPROLOL SUCCINATE ER 25 MG PO TB24: 25.0000 mg | ORAL_TABLET | Freq: Every morning | ORAL | 1 refills | Status: DC

## 2019-11-19 MED ORDER — TICAGRELOR 90 MG PO TABS: 90.0000 mg | ORAL_TABLET | Freq: Two times a day (BID) | ORAL | 1 refills | Status: AC

## 2019-11-19 MED ORDER — ATORVASTATIN CALCIUM 80 MG PO TABS: 80.0000 mg | ORAL_TABLET | Freq: Every day | ORAL | 1 refills | Status: DC

## 2019-11-19 MED ORDER — LISINOPRIL 10 MG PO TABS: 10.0000 mg | ORAL_TABLET | Freq: Every evening | ORAL | 1 refills | Status: DC

## 2019-12-04 DIAGNOSIS — H7192 Unspecified cholesteatoma, left ear: Secondary | ICD-10-CM | POA: Diagnosis not present

## 2019-12-17 ENCOUNTER — Other Ambulatory Visit: Payer: Self-pay

## 2019-12-17 ENCOUNTER — Emergency Department (HOSPITAL_COMMUNITY)
Admission: EM | Admit: 2019-12-17 | Discharge: 2019-12-17 | Disposition: A | Discharge status: Home / Self Care | Payer: PPO | Attending: Emergency Medicine | Admitting: Emergency Medicine

## 2019-12-17 ENCOUNTER — Encounter (HOSPITAL_COMMUNITY): Payer: Self-pay | Admitting: Emergency Medicine

## 2019-12-17 ENCOUNTER — Other Ambulatory Visit: Payer: Self-pay | Admitting: Otolaryngology

## 2019-12-17 ENCOUNTER — Emergency Department (HOSPITAL_COMMUNITY): Payer: PPO

## 2019-12-17 DIAGNOSIS — H7192 Unspecified cholesteatoma, left ear: Secondary | ICD-10-CM | POA: Insufficient documentation

## 2019-12-17 DIAGNOSIS — H6042 Cholesteatoma of left external ear: Secondary | ICD-10-CM | POA: Diagnosis not present

## 2019-12-17 DIAGNOSIS — R519 Headache, unspecified: Secondary | ICD-10-CM | POA: Insufficient documentation

## 2019-12-17 DIAGNOSIS — I2510 Atherosclerotic heart disease of native coronary artery without angina pectoris: Secondary | ICD-10-CM | POA: Diagnosis not present

## 2019-12-17 DIAGNOSIS — I252 Old myocardial infarction: Secondary | ICD-10-CM | POA: Diagnosis not present

## 2019-12-17 DIAGNOSIS — F17210 Nicotine dependence, cigarettes, uncomplicated: Secondary | ICD-10-CM | POA: Diagnosis not present

## 2019-12-17 DIAGNOSIS — I10 Essential (primary) hypertension: Secondary | ICD-10-CM | POA: Diagnosis not present

## 2019-12-17 DIAGNOSIS — F17290 Nicotine dependence, other tobacco product, uncomplicated: Secondary | ICD-10-CM | POA: Diagnosis not present

## 2019-12-17 DIAGNOSIS — H9202 Otalgia, left ear: Secondary | ICD-10-CM | POA: Diagnosis present

## 2019-12-17 LAB — CBC WITH DIFFERENTIAL/PLATELET
Abs Immature Granulocytes: 0.03 10*3/uL (ref 0.00–0.07)
Basophils Absolute: 0 10*3/uL (ref 0.0–0.1)
Basophils Relative: 0 %
Eosinophils Absolute: 0.1 10*3/uL (ref 0.0–0.5)
Eosinophils Relative: 1 %
HCT: 37 % — ABNORMAL LOW (ref 39.0–52.0)
Hemoglobin: 12.4 g/dL — ABNORMAL LOW (ref 13.0–17.0)
Immature Granulocytes: 0 %
Lymphocytes Relative: 24 %
Lymphs Abs: 1.9 10*3/uL (ref 0.7–4.0)
MCH: 27.6 pg (ref 26.0–34.0)
MCHC: 33.5 g/dL (ref 30.0–36.0)
MCV: 82.4 fL (ref 80.0–100.0)
Monocytes Absolute: 0.6 10*3/uL (ref 0.1–1.0)
Monocytes Relative: 7 %
Neutro Abs: 5.4 10*3/uL (ref 1.7–7.7)
Neutrophils Relative %: 68 %
Platelets: 289 10*3/uL (ref 150–400)
RBC: 4.49 MIL/uL (ref 4.22–5.81)
RDW: 14.6 % (ref 11.5–15.5)
WBC: 8.1 10*3/uL (ref 4.0–10.5)
nRBC: 0 % (ref 0.0–0.2)

## 2019-12-17 LAB — BASIC METABOLIC PANEL
Anion gap: 9 (ref 5–15)
BUN: 9 mg/dL (ref 8–23)
CO2: 19 mmol/L — ABNORMAL LOW (ref 22–32)
Calcium: 8.7 mg/dL — ABNORMAL LOW (ref 8.9–10.3)
Chloride: 99 mmol/L (ref 98–111)
Creatinine, Ser: 0.94 mg/dL (ref 0.61–1.24)
GFR calc Af Amer: >60 mL/min (ref >60)
GFR calc non Af Amer: >60 mL/min (ref >60)
Glucose, Bld: 91 mg/dL (ref 70–99)
Potassium: 4 mmol/L (ref 3.5–5.1)
Sodium: 127 mmol/L — ABNORMAL LOW (ref 135–145)

## 2019-12-17 MED ORDER — ONDANSETRON HCL 4 MG/2ML IJ SOLN
4.0000 mg | Freq: Once | INTRAMUSCULAR | Status: AC
  Administered 2019-12-17: 4 mg via INTRAVENOUS
  Filled 2019-12-17: qty 2

## 2019-12-17 MED ORDER — OXYCODONE-ACETAMINOPHEN 5-325 MG PO TABS: 1.0000 | ORAL_TABLET | Freq: Four times a day (QID) | ORAL | 0 refills | Status: DC | PRN

## 2019-12-17 MED ORDER — MORPHINE SULFATE (PF) 4 MG/ML IV SOLN
4.0000 mg | Freq: Once | INTRAVENOUS | Status: AC
  Administered 2019-12-17: 4 mg via INTRAVENOUS
  Filled 2019-12-17: qty 1

## 2019-12-17 NOTE — ED Triage Notes (Signed)
Pt c/o ear pain and headache for "awhile" now, but the last 3 days it has been much worse. States he was scheduled for surgery to repair his ear but was unable to have surgery due to cardiology not signing off.

## 2019-12-17 NOTE — Discharge Instructions (Signed)
Your sodium level remains low here currently at 127. Please follow closely with your primary care doctor and cardiologist to help in getting clearance for your ENT surgery. I have completed a CT scan here in the ED to assist with your ENT follow up. I have listed the name of your ENT doctor along with phone number. Please call tomorrow for follow up. Return to the ED with any chest pain, fever, trouble breathing, or other sudden worsening symptoms.

## 2019-12-17 NOTE — ED Provider Notes (Signed)
Emergency Department Provider Note   I have reviewed the triage vital signs and the nursing notes.   HISTORY  Chief Complaint Otalgia and Headache   HPI Michael Rivas is a 64 y.o. male with PMH of CAD, HTN, and EtOH abuse with hyponatremia presents to the emergency department with left ear pain and headache.  Symptoms have been ongoing for several weeks but worsening over the past few days.  Patient describes mainly left-sided headache coming from the ear but traveling to the head and neck area.  He has some occasional bleeding from the ear.  He was found to have a left cholesteatoma and has follow-up with ENT.  They have recommended tympanomastoidectomy but patient has been old able to go for surgery due to several cancellations, low sodium, recent NSTEMI and PCI.  Patient denies any fevers.  Has been taking Tylenol for pain.  Symptoms are moderate to severe with no clear modifying factors. Denies CP.   Past Medical History:  Diagnosis Date  . Coronary artery disease   . Heart attack (HCC) 10/25/2019  . Hypertension 2012  . NSTEMI (non-ST elevated myocardial infarction) [facility])     Patient Active Problem List   Diagnosis Date Noted  . Chest pain due to myocardial ischemia   . Coronary artery disease of native artery of native heart with stable angina pectoris (HCC)   . CAD S/P percutaneous coronary angioplasty   . Prediabetes   . NSTEMI (non-ST elevated myocardial infarction) (HCC) 10/25/2019  . Hyponatremia 10/25/2019  . Alcohol dependence (HCC) 10/25/2019  . Pulmonary nodule 10/25/2019  . Elevated troponin   . Angina pectoris (HCC)   . Essential hypertension, benign 03/02/2016  . Tobacco dependence 03/02/2016  . Depression 03/02/2016  . Chronic pain 03/02/2016    Past Surgical History:  Procedure Laterality Date  . BACK SURGERY  2016  . CHOLECYSTECTOMY  2007  . CORONARY ANGIOPLASTY  10/25/2019   CORONARY BALLOON ANGIOPLASTY  . CORONARY BALLOON ANGIOPLASTY N/A  10/25/2019   Procedure: CORONARY BALLOON ANGIOPLASTY;  Surgeon: [name], MD;  Location: [facility] INVASIVE CV LAB;  Service: Cardiovascular;  Laterality: N/A;  . INTRAVASCULAR PRESSURE WIRE/FFR STUDY N/A 10/25/2019   Procedure: INTRAVASCULAR PRESSURE WIRE/FFR STUDY;  Surgeon: [name], MD;  Location: [facility] INVASIVE CV LAB;  Service: Cardiovascular;  Laterality: N/A;  . INTRAVASCULAR ULTRASOUND/IVUS N/A 10/25/2019   Procedure: Intravascular Ultrasound/IVUS;  Surgeon: [name], MD;  Location: [facility] INVASIVE CV LAB;  Service: Cardiovascular;  Laterality: N/A;  . LEFT HEART CATH AND CORONARY ANGIOGRAPHY N/A 10/25/2019   Procedure: LEFT HEART CATH AND CORONARY ANGIOGRAPHY;  Surgeon: [name], MD;  Location: [facility] INVASIVE CV LAB;  Service: Cardiovascular;  Laterality: N/A;    Allergies Patient has no known allergies.  Family History  Problem Relation Age of Onset  . Stroke Mother   . Diabetes Mother   . Hypertension Mother   . Heart failure Father   . Heart attack Father   . Diabetes Maternal Aunt   . Heart attack Maternal Aunt   . Stroke Maternal Uncle   . Stroke Paternal Uncle   . Stroke Maternal Uncle   . Heart attack Paternal Uncle     Social History Social History   Tobacco Use  . Smoking status: Current Every Day Smoker    Packs/day: 0.50    Years: 45.00    Pack years: 22.50    Types: Cigars, Cigarettes  . Smokeless tobacco: Never Used  . Tobacco comment: smokes  2-3 cigars/daily.  former cigarette smoker , quit 2016, 0.5 ppd  Substance Use Topics  . Alcohol use: Yes    Alcohol/week: 35.0 standard drinks    Types: 35 Cans of beer per week    Comment: every other day  . Drug use: No    Review of Systems  Constitutional: No fever/chills Eyes: No visual changes. ENT: No sore throat. Positive earache.  Cardiovascular: Denies chest pain. Respiratory: Denies shortness of breath. Gastrointestinal: No abdominal pain.  No nausea, no vomiting.   No diarrhea.  No constipation. Genitourinary: Negative for dysuria. Musculoskeletal: Negative for back pain. Skin: Negative for rash. Neurological: Negative for focal weakness or numbness. Positive HA.   10-point ROS otherwise negative.  ____________________________________________   PHYSICAL EXAM:  VITAL SIGNS: ED Triage Vitals  Enc Vitals Group     BP 12/17/19 2047 (!) 144/79     Pulse Rate 12/17/19 2047 63     Resp 12/17/19 2047 18     Temp 12/17/19 2047 98.7 F (37.1 C)     Temp Source 12/17/19 2047 Oral     SpO2 12/17/19 2047 100 %     Weight 12/17/19 2048 150 lb (68 kg)     Height 12/17/19 2048 6' (1.829 m)   Constitutional: Alert and oriented. Well appearing and in no acute distress. Eyes: Conjunctivae are normal.  Head: Atraumatic. Ears: Erythema and opacification of the left TM with some exudate in the external auditory canal. No mastoid erythema or warmth.  Nose: No congestion/rhinnorhea. Mouth/Throat: Mucous membranes are moist.  Neck: No stridor.  Cardiovascular: Normal rate, regular rhythm. Good peripheral circulation. Grossly normal heart sounds.   Respiratory: Normal respiratory effort.  No retractions. Lungs CTAB. Gastrointestinal: No distention.  Musculoskeletal: No gross deformities of extremities. Neurologic:  Normal speech and language.  Skin:  Skin is warm, dry and intact. No rash noted.  ____________________________________________   LABS (all labs ordered are listed, but only abnormal results are displayed)  Labs Reviewed  BASIC METABOLIC PANEL - Abnormal; Notable for the following components:      Result Value   Sodium 127 (*)    CO2 19 (*)    Calcium 8.7 (*)    All other components within normal limits  CBC WITH DIFFERENTIAL/PLATELET - Abnormal; Notable for the following components:   Hemoglobin 12.4 (*)    HCT 37.0 (*)    All other components within normal limits   ____________________________________________  RADIOLOGY  CT Head  Wo Contrast  Result Date: 12/17/2019 CLINICAL DATA:  Ear pain and headache for several days, acutely worsening EXAM: CT HEAD WITHOUT CONTRAST TECHNIQUE: Contiguous axial images were obtained from the base of the skull through the vertex without intravenous contrast. COMPARISON:  Temporal bone CT 11 5 2020 FINDINGS: Brain: No evidence of acute infarction, hemorrhage, hydrocephalus, extra-axial collection or mass lesion/mass effect. Vascular: Atherosclerotic calcification of the carotid siphons. No hyperdense vessel. Skull: No calvarial fracture or suspicious osseous lesion. No scalp swelling or hematoma. Sinuses/Orbits: Extensive opacity seen throughout the left middle ear cavity with complete opacification of the left mastoid air cells and some hyperostotic changes favoring chronicity, with similar findings to the comparison CT temporal bone 06/08/2019. There is opacification of much of the attic of the middle ear cavity with destructive changes of the ossicular chain as well as progressive destructive changes along the left semi circular canals including a questionable focus of dehiscence along sigmoid plate. There is chronic dehiscence of the tympanic plate anteriorly along the posterior fossa  the temporomandibular joint. The tegmen tympani appears grossly intact on these non tailored images. The right mastoid air cells and paranasal sinuses are predominantly clear. Included orbital structures are unremarkable. Other: None IMPRESSION: 1. No acute intracranial abnormality. 2. Extensive opacity throughout the left middle ear cavity compatible with patient's known cholesteatoma. 3. Progressive destructive changes along the left ossicular chain. 4. Progressive destructive changes along the left semi circular canals including a questionable focus of dehiscence along the sigmoid plate. 5. Chronic dehiscence of the tympanic plate anteriorly along the posterior fossa the temporomandibular joint. 6. Consider dedicated  temporal bone CT for better interrogation of the sigmoid dehiscence. This study may be reconstructed from these images. Electronically Signed   By: Kreg Shropshire M.D.   On: 12/17/2019 22:10    ____________________________________________   PROCEDURES  Procedure(s) performed:   Procedures  None  ____________________________________________   INITIAL IMPRESSION / ASSESSMENT AND PLAN / ED COURSE  Pertinent labs & imaging results that were available during my care of the patient were reviewed by me and considered in my medical decision making (see chart for details).   Patient presents to the emergency department with earache and headache.  He is followed by Dr. Pollyann Kennedy.  Pain worsening in the last few days.  Patient with exudate and opacification of the left TM consistent with cholesteatoma. Patient has not been a surgical candidate in the past due to CAD and hyponatremia. Will repeat labs here. Will send for CT head and give Morphine and Zofran for pain and nausea.   CT head and labs reviewed.  Sodium remains slightly low at 127 although not symptomatic.  Discussed fully stopping drinking.  Patient is only occasionally drinking at this time.  No leukocytosis.  CT imaging shows chronic findings.  Patient will call the ENT doctor he has followed to discuss CT images and get an update on operative intervention possibilities.  Provided contact information for Dr. Pollyann Kennedy who we have seen in the past.  He plans to call in the morning.   Reviewed the [place] drug database prior to prescribing small amount of Percocet for breakthrough pain. Discussed not driving or taking with other sedating meds or EtOH. Patient has a ride home from the ED here. Discussed ED return precautions.  ____________________________________________  FINAL CLINICAL IMPRESSION(S) / ED DIAGNOSES  Final diagnoses:  Acute nonintractable headache, unspecified headache type  Cholesteatoma of left ear     MEDICATIONS GIVEN DURING  THIS VISIT:  Medications  morphine 4 MG/ML injection 4 mg (4 mg Intravenous Given 12/17/19 2134)  ondansetron (ZOFRAN) injection 4 mg (4 mg Intravenous Given 12/17/19 2134)     NEW OUTPATIENT MEDICATIONS STARTED DURING THIS VISIT:  New Prescriptions   OXYCODONE-ACETAMINOPHEN (PERCOCET/ROXICET) 5-325 MG TABLET    Take 1 tablet by mouth every 6 (six) hours as needed for severe pain.    Note:  This document was prepared using Dragon voice recognition software and may include unintentional dictation errors.  Alona Bene, MD, [facility] Emergency Medicine    Tabari Volkert, Arlyss Repress, MD 12/17/19 2330

## 2019-12-18 ENCOUNTER — Other Ambulatory Visit: Payer: Self-pay | Admitting: Otolaryngology

## 2019-12-18 DIAGNOSIS — H7192 Unspecified cholesteatoma, left ear: Secondary | ICD-10-CM

## 2019-12-20 DIAGNOSIS — K219 Gastro-esophageal reflux disease without esophagitis: Secondary | ICD-10-CM | POA: Diagnosis not present

## 2019-12-20 DIAGNOSIS — R110 Nausea: Secondary | ICD-10-CM | POA: Diagnosis not present

## 2019-12-20 DIAGNOSIS — H9220 Otorrhagia, unspecified ear: Secondary | ICD-10-CM | POA: Diagnosis not present

## 2019-12-20 DIAGNOSIS — R634 Abnormal weight loss: Secondary | ICD-10-CM | POA: Diagnosis not present

## 2019-12-20 DIAGNOSIS — H7100 Cholesteatoma of attic, unspecified ear: Secondary | ICD-10-CM | POA: Diagnosis not present

## 2019-12-22 ENCOUNTER — Other Ambulatory Visit: Payer: Self-pay

## 2019-12-22 ENCOUNTER — Inpatient Hospital Stay (HOSPITAL_COMMUNITY)
Admission: EM | Admit: 2019-12-22 | Discharge: 2019-12-31 | DRG: 640 | Disposition: A | Discharge status: Skilled Nursing Facility | Payer: PPO | Attending: Internal Medicine | Admitting: Internal Medicine

## 2019-12-22 ENCOUNTER — Emergency Department (HOSPITAL_COMMUNITY): Payer: PPO

## 2019-12-22 ENCOUNTER — Encounter (HOSPITAL_COMMUNITY): Payer: Self-pay | Admitting: Emergency Medicine

## 2019-12-22 DIAGNOSIS — G9340 Encephalopathy, unspecified: Secondary | ICD-10-CM | POA: Diagnosis not present

## 2019-12-22 DIAGNOSIS — Z8249 Family history of ischemic heart disease and other diseases of the circulatory system: Secondary | ICD-10-CM

## 2019-12-22 DIAGNOSIS — F1029 Alcohol dependence with unspecified alcohol-induced disorder: Secondary | ICD-10-CM | POA: Diagnosis not present

## 2019-12-22 DIAGNOSIS — I2510 Atherosclerotic heart disease of native coronary artery without angina pectoris: Secondary | ICD-10-CM | POA: Diagnosis present

## 2019-12-22 DIAGNOSIS — I252 Old myocardial infarction: Secondary | ICD-10-CM

## 2019-12-22 DIAGNOSIS — Z79899 Other long term (current) drug therapy: Secondary | ICD-10-CM | POA: Diagnosis not present

## 2019-12-22 DIAGNOSIS — I25118 Atherosclerotic heart disease of native coronary artery with other forms of angina pectoris: Secondary | ICD-10-CM | POA: Diagnosis not present

## 2019-12-22 DIAGNOSIS — H7190 Unspecified cholesteatoma, unspecified ear: Secondary | ICD-10-CM | POA: Diagnosis present

## 2019-12-22 DIAGNOSIS — G9341 Metabolic encephalopathy: Secondary | ICD-10-CM | POA: Diagnosis not present

## 2019-12-22 DIAGNOSIS — R1312 Dysphagia, oropharyngeal phase: Secondary | ICD-10-CM | POA: Diagnosis not present

## 2019-12-22 DIAGNOSIS — R0689 Other abnormalities of breathing: Secondary | ICD-10-CM | POA: Diagnosis not present

## 2019-12-22 DIAGNOSIS — R197 Diarrhea, unspecified: Secondary | ICD-10-CM | POA: Diagnosis not present

## 2019-12-22 DIAGNOSIS — M6281 Muscle weakness (generalized): Secondary | ICD-10-CM | POA: Diagnosis not present

## 2019-12-22 DIAGNOSIS — Z79891 Long term (current) use of opiate analgesic: Secondary | ICD-10-CM | POA: Diagnosis not present

## 2019-12-22 DIAGNOSIS — H7122 Cholesteatoma of mastoid, left ear: Secondary | ICD-10-CM | POA: Diagnosis present

## 2019-12-22 DIAGNOSIS — R4182 Altered mental status, unspecified: Secondary | ICD-10-CM

## 2019-12-22 DIAGNOSIS — R262 Difficulty in walking, not elsewhere classified: Secondary | ICD-10-CM | POA: Diagnosis not present

## 2019-12-22 DIAGNOSIS — E871 Hypo-osmolality and hyponatremia: Principal | ICD-10-CM | POA: Diagnosis present

## 2019-12-22 DIAGNOSIS — H7192 Unspecified cholesteatoma, left ear: Secondary | ICD-10-CM | POA: Diagnosis not present

## 2019-12-22 DIAGNOSIS — Z9861 Coronary angioplasty status: Secondary | ICD-10-CM

## 2019-12-22 DIAGNOSIS — R7303 Prediabetes: Secondary | ICD-10-CM | POA: Diagnosis present

## 2019-12-22 DIAGNOSIS — G4489 Other headache syndrome: Secondary | ICD-10-CM | POA: Diagnosis not present

## 2019-12-22 DIAGNOSIS — E7439 Other disorders of intestinal carbohydrate absorption: Secondary | ICD-10-CM | POA: Diagnosis present

## 2019-12-22 DIAGNOSIS — R066 Hiccough: Secondary | ICD-10-CM | POA: Diagnosis not present

## 2019-12-22 DIAGNOSIS — R509 Fever, unspecified: Secondary | ICD-10-CM | POA: Diagnosis not present

## 2019-12-22 DIAGNOSIS — Z833 Family history of diabetes mellitus: Secondary | ICD-10-CM | POA: Diagnosis not present

## 2019-12-22 DIAGNOSIS — R2681 Unsteadiness on feet: Secondary | ICD-10-CM | POA: Diagnosis not present

## 2019-12-22 DIAGNOSIS — I10 Essential (primary) hypertension: Secondary | ICD-10-CM | POA: Diagnosis not present

## 2019-12-22 DIAGNOSIS — Z7982 Long term (current) use of aspirin: Secondary | ICD-10-CM

## 2019-12-22 DIAGNOSIS — R42 Dizziness and giddiness: Secondary | ICD-10-CM | POA: Diagnosis not present

## 2019-12-22 DIAGNOSIS — Z20822 Contact with and (suspected) exposure to covid-19: Secondary | ICD-10-CM | POA: Diagnosis present

## 2019-12-22 DIAGNOSIS — F1020 Alcohol dependence, uncomplicated: Secondary | ICD-10-CM | POA: Diagnosis not present

## 2019-12-22 DIAGNOSIS — R404 Transient alteration of awareness: Secondary | ICD-10-CM | POA: Diagnosis not present

## 2019-12-22 DIAGNOSIS — F1010 Alcohol abuse, uncomplicated: Secondary | ICD-10-CM | POA: Diagnosis not present

## 2019-12-22 DIAGNOSIS — R41841 Cognitive communication deficit: Secondary | ICD-10-CM | POA: Diagnosis not present

## 2019-12-22 DIAGNOSIS — F17290 Nicotine dependence, other tobacco product, uncomplicated: Secondary | ICD-10-CM | POA: Diagnosis present

## 2019-12-22 DIAGNOSIS — G43909 Migraine, unspecified, not intractable, without status migrainosus: Secondary | ICD-10-CM | POA: Diagnosis present

## 2019-12-22 DIAGNOSIS — R519 Headache, unspecified: Secondary | ICD-10-CM | POA: Diagnosis not present

## 2019-12-22 HISTORY — DX: Migraine, unspecified, not intractable, without status migrainosus: G43.909

## 2019-12-22 LAB — URINALYSIS, ROUTINE W REFLEX MICROSCOPIC
Bilirubin Urine: NEGATIVE
Glucose, UA: 50 mg/dL — AB
Ketones, ur: NEGATIVE mg/dL
Leukocytes,Ua: NEGATIVE
Nitrite: NEGATIVE
Protein, ur: 100 mg/dL — AB
Specific Gravity, Urine: 1.021 (ref 1.005–1.030)
pH: 6 (ref 5.0–8.0)

## 2019-12-22 LAB — RAPID URINE DRUG SCREEN, HOSP PERFORMED
Amphetamines: NOT DETECTED
Barbiturates: NOT DETECTED
Benzodiazepines: NOT DETECTED
Cocaine: NOT DETECTED
Opiates: POSITIVE — AB
Tetrahydrocannabinol: POSITIVE — AB

## 2019-12-22 LAB — DIFFERENTIAL
Abs Immature Granulocytes: 0.04 10*3/uL (ref 0.00–0.07)
Basophils Absolute: 0 10*3/uL (ref 0.0–0.1)
Basophils Relative: 0 %
Eosinophils Absolute: 0 10*3/uL (ref 0.0–0.5)
Eosinophils Relative: 0 %
Immature Granulocytes: 0 %
Lymphocytes Relative: 9 %
Lymphs Abs: 1 10*3/uL (ref 0.7–4.0)
Monocytes Absolute: 0.6 10*3/uL (ref 0.1–1.0)
Monocytes Relative: 6 %
Neutro Abs: 9.6 10*3/uL — ABNORMAL HIGH (ref 1.7–7.7)
Neutrophils Relative %: 85 %

## 2019-12-22 LAB — CBC
HCT: 37.3 % — ABNORMAL LOW (ref 39.0–52.0)
Hemoglobin: 12.9 g/dL — ABNORMAL LOW (ref 13.0–17.0)
MCH: 27.5 pg (ref 26.0–34.0)
MCHC: 34.6 g/dL (ref 30.0–36.0)
MCV: 79.5 fL — ABNORMAL LOW (ref 80.0–100.0)
Platelets: 329 10*3/uL (ref 150–400)
RBC: 4.69 MIL/uL (ref 4.22–5.81)
RDW: 14.4 % (ref 11.5–15.5)
WBC: 11.3 10*3/uL — ABNORMAL HIGH (ref 4.0–10.5)
nRBC: 0 % (ref 0.0–0.2)

## 2019-12-22 LAB — COMPREHENSIVE METABOLIC PANEL
ALT: 10 U/L (ref 0–44)
AST: 15 U/L (ref 15–41)
Albumin: 3.5 g/dL (ref 3.5–5.0)
Alkaline Phosphatase: 58 U/L (ref 38–126)
Anion gap: 12 (ref 5–15)
BUN: 13 mg/dL (ref 8–23)
CO2: 22 mmol/L (ref 22–32)
Calcium: 9.1 mg/dL (ref 8.9–10.3)
Chloride: 86 mmol/L — ABNORMAL LOW (ref 98–111)
Creatinine, Ser: 1 mg/dL (ref 0.61–1.24)
GFR calc Af Amer: >60 mL/min (ref >60)
GFR calc non Af Amer: >60 mL/min (ref >60)
Glucose, Bld: 149 mg/dL — ABNORMAL HIGH (ref 70–99)
Potassium: 4.2 mmol/L (ref 3.5–5.1)
Sodium: 120 mmol/L — ABNORMAL LOW (ref 135–145)
Total Bilirubin: 0.8 mg/dL (ref 0.3–1.2)
Total Protein: 8.6 g/dL — ABNORMAL HIGH (ref 6.5–8.1)

## 2019-12-22 LAB — SARS CORONAVIRUS 2 BY RT PCR (HOSPITAL ORDER, PERFORMED IN ~~LOC~~ HOSPITAL LAB): SARS Coronavirus 2: NEGATIVE

## 2019-12-22 LAB — ACETAMINOPHEN LEVEL: Acetaminophen (Tylenol), Serum: <10 ug/mL — ABNORMAL LOW (ref 10–30)

## 2019-12-22 LAB — AMMONIA: Ammonia: 17 umol/L (ref 9–35)

## 2019-12-22 LAB — ETHANOL: Alcohol, Ethyl (B): <10 mg/dL (ref <10)

## 2019-12-22 LAB — PHOSPHORUS: Phosphorus: 3 mg/dL (ref 2.5–4.6)

## 2019-12-22 LAB — PROTIME-INR
INR: 1.1 (ref 0.8–1.2)
Prothrombin Time: 13.2 seconds (ref 11.4–15.2)

## 2019-12-22 LAB — APTT: aPTT: 41 seconds — ABNORMAL HIGH (ref 24–36)

## 2019-12-22 LAB — SODIUM: Sodium: 119 mmol/L — CL (ref 135–145)

## 2019-12-22 LAB — MAGNESIUM: Magnesium: 1.6 mg/dL — ABNORMAL LOW (ref 1.7–2.4)

## 2019-12-22 MED ORDER — PANTOPRAZOLE SODIUM 20 MG PO TBEC
20.0000 mg | DELAYED_RELEASE_TABLET | Freq: Every day | ORAL | Status: DC
  Administered 2019-12-22: 20 mg via ORAL
  Filled 2019-12-22 (×3): qty 1

## 2019-12-22 MED ORDER — FOLIC ACID 1 MG PO TABS
1.0000 mg | ORAL_TABLET | Freq: Every day | ORAL | Status: DC
  Administered 2019-12-22: 1 mg via ORAL
  Filled 2019-12-22: qty 1

## 2019-12-22 MED ORDER — ONDANSETRON 4 MG PO TBDP: 4.0000 mg | ORAL_TABLET | Freq: Three times a day (TID) | ORAL | Status: DC | PRN

## 2019-12-22 MED ORDER — THIAMINE HCL 100 MG PO TABS
100.0000 mg | ORAL_TABLET | Freq: Every day | ORAL | Status: DC
  Administered 2019-12-22: 100 mg via ORAL
  Filled 2019-12-22: qty 1

## 2019-12-22 MED ORDER — ASPIRIN EC 81 MG PO TBEC
81.0000 mg | DELAYED_RELEASE_TABLET | Freq: Every day | ORAL | Status: DC
  Administered 2019-12-25 – 2019-12-31 (×6): 81 mg via ORAL
  Filled 2019-12-22 (×8): qty 1

## 2019-12-22 MED ORDER — SODIUM CHLORIDE 0.9 % IV BOLUS
500.0000 mL | Freq: Once | INTRAVENOUS | Status: AC
  Administered 2019-12-22: 500 mL via INTRAVENOUS

## 2019-12-22 MED ORDER — HYDROCODONE-ACETAMINOPHEN 7.5-325 MG PO TABS
1.0000 | ORAL_TABLET | Freq: Four times a day (QID) | ORAL | Status: DC | PRN
  Administered 2019-12-27 – 2019-12-29 (×4): 1 via ORAL
  Filled 2019-12-22 (×5): qty 1

## 2019-12-22 MED ORDER — LORAZEPAM 2 MG/ML IJ SOLN
1.0000 mg | INTRAMUSCULAR | Status: AC | PRN
  Administered 2019-12-23: 4 mg via INTRAVENOUS
  Administered 2019-12-23: 1 mg via INTRAVENOUS
  Administered 2019-12-23: 2 mg via INTRAVENOUS
  Administered 2019-12-23: 3 mg via INTRAVENOUS
  Administered 2019-12-23 (×5): 2 mg via INTRAVENOUS
  Administered 2019-12-23: 3 mg via INTRAVENOUS
  Filled 2019-12-22 (×2): qty 1
  Filled 2019-12-22 (×3): qty 2
  Filled 2019-12-22 (×6): qty 1

## 2019-12-22 MED ORDER — ONDANSETRON HCL 4 MG PO TABS: 4.0000 mg | ORAL_TABLET | Freq: Four times a day (QID) | ORAL | Status: DC | PRN

## 2019-12-22 MED ORDER — TICAGRELOR 90 MG PO TABS
90.0000 mg | ORAL_TABLET | Freq: Two times a day (BID) | ORAL | Status: DC
  Administered 2019-12-22 – 2019-12-31 (×13): 90 mg via ORAL
  Filled 2019-12-22 (×14): qty 1

## 2019-12-22 MED ORDER — SODIUM CHLORIDE 0.9 % IV SOLN: INTRAVENOUS | Status: DC

## 2019-12-22 MED ORDER — METOPROLOL SUCCINATE ER 25 MG PO TB24: 25.0000 mg | ORAL_TABLET | Freq: Every morning | ORAL | Status: DC

## 2019-12-22 MED ORDER — HYDRALAZINE HCL 25 MG PO TABS
25.0000 mg | ORAL_TABLET | Freq: Three times a day (TID) | ORAL | Status: DC | PRN
  Administered 2019-12-22: 25 mg via ORAL
  Filled 2019-12-22: qty 1

## 2019-12-22 MED ORDER — ADULT MULTIVITAMIN W/MINERALS CH
1.0000 | ORAL_TABLET | Freq: Every day | ORAL | Status: DC
  Administered 2019-12-22: 1 via ORAL
  Filled 2019-12-22: qty 1

## 2019-12-22 MED ORDER — ONDANSETRON HCL 4 MG/2ML IJ SOLN
4.0000 mg | Freq: Four times a day (QID) | INTRAMUSCULAR | Status: DC | PRN
  Administered 2019-12-23 – 2019-12-25 (×2): 4 mg via INTRAVENOUS
  Filled 2019-12-22 (×2): qty 2

## 2019-12-22 MED ORDER — LORAZEPAM 1 MG PO TABS
1.0000 mg | ORAL_TABLET | ORAL | Status: AC | PRN
  Administered 2019-12-22: 1 mg via ORAL
  Filled 2019-12-22: qty 1

## 2019-12-22 MED ORDER — ATORVASTATIN CALCIUM 40 MG PO TABS
80.0000 mg | ORAL_TABLET | Freq: Every day | ORAL | Status: DC
  Administered 2019-12-22 – 2019-12-30 (×6): 80 mg via ORAL
  Filled 2019-12-22 (×6): qty 2

## 2019-12-22 MED ORDER — THIAMINE HCL 100 MG/ML IJ SOLN: 100.0000 mg | Freq: Every day | INTRAMUSCULAR | Status: DC

## 2019-12-22 MED ORDER — ENOXAPARIN SODIUM 40 MG/0.4ML ~~LOC~~ SOLN
40.0000 mg | Freq: Every day | SUBCUTANEOUS | Status: DC
  Administered 2019-12-22 – 2019-12-30 (×9): 40 mg via SUBCUTANEOUS
  Filled 2019-12-22 (×9): qty 0.4

## 2019-12-22 MED ORDER — MEGESTROL ACETATE 40 MG PO TABS
40.0000 mg | ORAL_TABLET | Freq: Two times a day (BID) | ORAL | Status: DC
  Administered 2019-12-22 – 2019-12-31 (×13): 40 mg via ORAL
  Filled 2019-12-22 (×23): qty 1

## 2019-12-22 NOTE — ED Notes (Signed)
EKG given to Dr. Zackowski  

## 2019-12-22 NOTE — ED Triage Notes (Signed)
Patient brought in via EMS. Alert, disoriented to time. Patient c/o headache and dizziness. Per paramedic patient's wife states "he hasn't been acting right since he got up this morning." Patient slow to answer and restless. Patient does have hx of migraine headaches.

## 2019-12-22 NOTE — ED Notes (Signed)
Have notified Dr. Deretha Emory of patient's condition

## 2019-12-22 NOTE — ED Provider Notes (Signed)
[facility] EMERGENCY DEPARTMENT Provider Note   CSN: [id number] Arrival date & time: 12/22/19  1311     History Chief Complaint  Patient presents with  . Altered Mental Status    Michael Rivas is a 64 y.o. male.  Patient brought in by EMS.  Patient was alert but disoriented to his age but could say his name.  Nursing said complaint of a headache when I spoke to the patient he denied any headache or any dizziness or any pain.  Per paramedic patient's wife had says he has been acting right since he got up this morning.  Patient is slow to answer questions and is restless.  Patient apparently does have a history of migraine headaches.  Patient was seen on May 17 for complaint of ear pain and headache for a while and it was worse over the last 3 days.        Past Medical History:  Diagnosis Date  . Coronary artery disease   . Heart attack (HCC) 10/25/2019  . Hypertension 2012  . Migraine   . NSTEMI (non-ST elevated myocardial infarction) [facility])     Patient Active Problem List   Diagnosis Date Noted  . Chest pain due to myocardial ischemia   . Coronary artery disease of native artery of native heart with stable angina pectoris (HCC)   . CAD S/P percutaneous coronary angioplasty   . Prediabetes   . NSTEMI (non-ST elevated myocardial infarction) (HCC) 10/25/2019  . Hyponatremia 10/25/2019  . Alcohol dependence (HCC) 10/25/2019  . Pulmonary nodule 10/25/2019  . Elevated troponin   . Angina pectoris (HCC)   . Essential hypertension, benign 03/02/2016  . Tobacco dependence 03/02/2016  . Depression 03/02/2016  . Chronic pain 03/02/2016    Past Surgical History:  Procedure Laterality Date  . BACK SURGERY  2016  . CHOLECYSTECTOMY  2007  . CORONARY ANGIOPLASTY  10/25/2019   CORONARY BALLOON ANGIOPLASTY  . CORONARY BALLOON ANGIOPLASTY N/A 10/25/2019   Procedure: CORONARY BALLOON ANGIOPLASTY;  Surgeon: [name], MD;  Location: [facility] INVASIVE CV LAB;  Service:  Cardiovascular;  Laterality: N/A;  . INTRAVASCULAR PRESSURE WIRE/FFR STUDY N/A 10/25/2019   Procedure: INTRAVASCULAR PRESSURE WIRE/FFR STUDY;  Surgeon: [name], MD;  Location: [facility] INVASIVE CV LAB;  Service: Cardiovascular;  Laterality: N/A;  . INTRAVASCULAR ULTRASOUND/IVUS N/A 10/25/2019   Procedure: Intravascular Ultrasound/IVUS;  Surgeon: [name], MD;  Location: [facility] INVASIVE CV LAB;  Service: Cardiovascular;  Laterality: N/A;  . LEFT HEART CATH AND CORONARY ANGIOGRAPHY N/A 10/25/2019   Procedure: LEFT HEART CATH AND CORONARY ANGIOGRAPHY;  Surgeon: [name], MD;  Location: [facility] INVASIVE CV LAB;  Service: Cardiovascular;  Laterality: N/A;       Family History  Problem Relation Age of Onset  . Stroke Mother   . Diabetes Mother   . Hypertension Mother   . Heart failure Father   . Heart attack Father   . Diabetes Maternal Aunt   . Heart attack Maternal Aunt   . Stroke Maternal Uncle   . Stroke Paternal Uncle   . Stroke Maternal Uncle   . Heart attack Paternal Uncle     Social History   Tobacco Use  . Smoking status: Current Every Day Smoker    Packs/day: 0.50    Years: 45.00    Pack years: 22.50    Types: Cigars, Cigarettes  . Smokeless tobacco: Never Used  . Tobacco comment: smokes 2-3 cigars/daily.  former cigarette smoker , quit 2016, 0.5 ppd  Substance Use Topics  . Alcohol use: Yes    Alcohol/week: 35.0 standard drinks    Types: 35 Cans of beer per week    Comment: every other day  . Drug use: No    Home Medications Prior to Admission medications   Medication Sig Start Date End Date Taking? Authorizing Provider  aspirin EC 81 MG EC tablet Take 1 tablet (81 mg total) by mouth daily. 10/26/19 12/25/19  Margie Ege A, DO  atorvastatin (LIPITOR) 80 MG tablet Take 1 tablet (80 mg total) by mouth daily at 6 PM. 11/19/19 01/18/20  Patwardhan, Anabel Bene, MD  lisinopril (ZESTRIL) 10 MG tablet Take 1 tablet (10 mg total) by mouth every evening.  11/19/19 01/18/20  Patwardhan, Anabel Bene, MD  metoprolol succinate (TOPROL-XL) 25 MG 24 hr tablet Take 1 tablet (25 mg total) by mouth every morning. 11/19/19 01/18/20  Patwardhan, Anabel Bene, MD  nitroGLYCERIN (NITROSTAT) 0.4 MG SL tablet Place 1 tablet (0.4 mg total) under the tongue every 5 (five) minutes as needed for chest pain. Take for up to 3 doses. Call physician. 10/26/19 10/25/20  Margie Ege A, DO  oxyCODONE-acetaminophen (PERCOCET/ROXICET) 5-325 MG tablet Take 1 tablet by mouth every 6 (six) hours as needed for severe pain. 12/17/19   Long, Arlyss Repress, MD  ticagrelor (BRILINTA) 90 MG TABS tablet Take 1 tablet (90 mg total) by mouth 2 (two) times daily. 11/19/19 01/18/20  Patwardhan, Anabel Bene, MD  varenicline (CHANTIX STARTING MONTH PAK) 0.5 MG X 11 & 1 MG X 42 tablet Take one 0.5 mg tablet by mouth once daily for 3 days, then increase to one 0.5 mg tablet twice daily for 4 days, then increase to one 1 mg tablet twice daily. Patient not taking: Reported on 12/17/2019 11/07/19   [name], MD    Allergies    Patient has no known allergies.  Review of Systems   Review of Systems  Unable to perform ROS: Mental status change    Physical Exam Updated Vital Signs BP (!) 178/78 (BP Location: Left Arm)   Pulse 90   Temp 99.6 F (37.6 C) (Oral)   Resp 18   Ht 1.829 m (6')   Wt 63.5 kg   SpO2 98%   BMI 18.99 kg/m   Physical Exam Vitals and nursing note reviewed.  Constitutional:      Appearance: Normal appearance. He is well-developed.  HENT:     Head: Normocephalic and atraumatic.  Eyes:     Extraocular Movements: Extraocular movements intact.     Conjunctiva/sclera: Conjunctivae normal.     Pupils: Pupils are equal, round, and reactive to light.  Cardiovascular:     Rate and Rhythm: Normal rate and regular rhythm.     Heart sounds: No murmur.  Pulmonary:     Effort: Pulmonary effort is normal. No respiratory distress.     Breath sounds: Normal breath sounds.  Abdominal:       Palpations: Abdomen is soft.     Tenderness: There is no abdominal tenderness.  Musculoskeletal:        General: No swelling.     Cervical back: Normal range of motion and neck supple.  Skin:    General: Skin is warm and dry.  Neurological:     Mental Status: He is alert.     Cranial Nerves: No cranial nerve deficit.     Sensory: No sensory deficit.     Motor: No weakness.     Comments: Patient will follow  all commands.  Does not appear to have any significant focal neuro deficit.  However he is able to state his name but not sure of his age so there is a component of confusion.     ED Results / Procedures / Treatments   Labs (all labs ordered are listed, but only abnormal results are displayed) Labs Reviewed  ETHANOL  PROTIME-INR  APTT  CBC  DIFFERENTIAL  COMPREHENSIVE METABOLIC PANEL  RAPID URINE DRUG SCREEN, HOSP PERFORMED  URINALYSIS, ROUTINE W REFLEX MICROSCOPIC  ACETAMINOPHEN LEVEL  I-STAT CHEM 8, ED    EKG EKG Interpretation  Date/Time:  Saturday Dec 22 2019 14:16:07 EDT Ventricular Rate:  86 PR Interval:  132 QRS Duration: 76 QT Interval:  384 QTC Calculation: 459 R Axis:   93 Text Interpretation: Sinus rhythm with Premature atrial complexes with Abberant conduction Rightward axis Anteroseptal infarct , age undetermined Abnormal ECG Confirmed by Fredia Sorrow [phone]) on 12/22/2019 2:22:34 PM   Radiology No results found.  Procedures Procedures (including critical care time)  CRITICAL CARE Performed by: Fredia Sorrow Total critical care time: 35 minutes Critical care time was exclusive of separately billable procedures and treating other patients. Critical care was necessary to treat or prevent imminent or life-threatening deterioration. Critical care was time spent personally by me on the following activities: development of treatment plan with patient and/or surrogate as well as nursing, discussions with consultants, evaluation of patient's  response to treatment, examination of patient, obtaining history from patient or surrogate, ordering and performing treatments and interventions, ordering and review of laboratory studies, ordering and review of radiographic studies, pulse oximetry and re-evaluation of patient's condition.   Medications Ordered in ED Medications  0.9 %  sodium chloride infusion ( Intravenous New Bag/Given 12/22/19 1526)    ED Course  I have reviewed the triage vital signs and the nursing notes.  Pertinent labs & imaging results that were available during my care of the patient were reviewed by me and considered in my medical decision making (see chart for details).    MDM Rules/Calculators/A&P                     Patient with confusion here but does not seem to have any focal neuro deficit.  Has a history of headaches not clear whether migraines or not but apparently stated to such.  Not clear at this point what his altered mental status is from.  Stroke order set was used code stroke not activated based on his neuro exam.  And also labs put in for altered mental status.  Patient turned over to evening the emergency physician Dr. Roderic Palau for disposition.  Final Clinical Impression(s) / ED Diagnoses Final diagnoses:  AMS (altered mental status)    Rx / DC Orders ED Discharge Orders    None       Fredia Sorrow, MD 12/22/19 1535

## 2019-12-22 NOTE — H&P (Signed)
History and Physical  Michael Rivas JKD:[id number] DOB: 1955/12/16 DOA: 12/22/2019  Referring physician: Dr Dewayne Hatch, ED physician PCP: Celene Squibb, MD  Outpatient Specialists:  Dr Constance Holster (ENT)  Patient Coming From: home  Chief Complaint: confusion  HPI: Michael Rivas is a 64 y.o. male with a history of CAD s/p recent NSTEMI, cholesteatoma in March, HTN, prediabetes.  Patient seen due to worsening confusion for the past 3 days.  He is oriented to person and place, but not to time.  He is present with his wife who relays his increased confusion.  No palliating or provoking factors.  He did have an NSTEMI in March and was placed on several different medications, including atorvastatin, lisinopril, metoprolol.  He used to drink several beers a day and is currently drinking maybe 1 beer a day.  Emergency Department Course: Head CT clear, sodium 120.  Creatinine 1.0, BUN 13, ammonia 17.  Alcohol less than 10.  UA unremarkable with a specific gravity of 1.021  Review of Systems:   Pt denies any fevers, chills, nausea, vomiting, diarrhea, constipation, abdominal pain, shortness of breath, dyspnea on exertion, orthopnea, cough, wheezing, palpitations, headache, vision changes, lightheadedness, dizziness, melena, rectal bleeding.  Review of systems are otherwise negative  Past Medical History:  Diagnosis Date  . Coronary artery disease   . Heart attack ([place]) 10/25/2019  . Hypertension 2012  . Migraine   . NSTEMI (non-ST elevated myocardial infarction) [facility])    Past Surgical History:  Procedure Laterality Date  . BACK SURGERY  2016  . CHOLECYSTECTOMY  2007  . CORONARY ANGIOPLASTY  10/25/2019   CORONARY BALLOON ANGIOPLASTY  . CORONARY BALLOON ANGIOPLASTY N/A 10/25/2019   Procedure: CORONARY BALLOON ANGIOPLASTY;  Surgeon: Nigel Mormon, MD;  Location: [place] CV LAB;  Service: Cardiovascular;  Laterality: N/A;  . INTRAVASCULAR PRESSURE WIRE/FFR STUDY N/A 10/25/2019   Procedure:  INTRAVASCULAR PRESSURE WIRE/FFR STUDY;  Surgeon: Nigel Mormon, MD;  Location: [place] CV LAB;  Service: Cardiovascular;  Laterality: N/A;  . INTRAVASCULAR ULTRASOUND/IVUS N/A 10/25/2019   Procedure: Intravascular Ultrasound/IVUS;  Surgeon: Nigel Mormon, MD;  Location: [place] CV LAB;  Service: Cardiovascular;  Laterality: N/A;  . LEFT HEART CATH AND CORONARY ANGIOGRAPHY N/A 10/25/2019   Procedure: LEFT HEART CATH AND CORONARY ANGIOGRAPHY;  Surgeon: Nigel Mormon, MD;  Location: [place] CV LAB;  Service: Cardiovascular;  Laterality: N/A;   Social History:  reports that he has been smoking cigars and cigarettes. He has a 22.50 pack-year smoking history. He has never used smokeless tobacco. He reports current alcohol use of about 35.0 standard drinks of alcohol per week. He reports that he does not use drugs. Patient lives at home  No Known Allergies  Family History  Problem Relation Age of Onset  . Stroke Mother   . Diabetes Mother   . Hypertension Mother   . Heart failure Father   . Heart attack Father   . Diabetes Maternal Aunt   . Heart attack Maternal Aunt   . Stroke Maternal Uncle   . Stroke Paternal Uncle   . Stroke Maternal Uncle   . Heart attack Paternal Uncle       Prior to Admission medications   Medication Sig Start Date End Date Taking? Authorizing Provider  aspirin EC 81 MG EC tablet Take 1 tablet (81 mg total) by mouth daily. 10/26/19 12/25/19 Yes Kyle, Tyrone A, DO  atorvastatin (LIPITOR) 80 MG tablet Take 1 tablet (80 mg total) by mouth daily at  6 PM. 11/19/19 01/18/20 Yes Patwardhan, Manish J, MD  lisinopril (ZESTRIL) 10 MG tablet Take 1 tablet (10 mg total) by mouth every evening. 11/19/19 01/18/20 Yes Patwardhan, Manish J, MD  metoprolol succinate (TOPROL-XL) 25 MG 24 hr tablet Take 1 tablet (25 mg total) by mouth every morning. 11/19/19 01/18/20 Yes Patwardhan, Manish J, MD  nitroGLYCERIN (NITROSTAT) 0.4 MG SL tablet Place 1 tablet (0.4 mg  total) under the tongue every 5 (five) minutes as needed for chest pain. Take for up to 3 doses. Call physician. 10/26/19 10/25/20 Yes Ronaldo Miyamoto, Tyrone A, DO  ondansetron (ZOFRAN-ODT) 4 MG disintegrating tablet Take 4 mg by mouth 3 (three) times daily as needed for nausea or vomiting.  12/20/19  Yes [provider]  ticagrelor (BRILINTA) 90 MG TABS tablet Take 1 tablet (90 mg total) by mouth 2 (two) times daily. 11/19/19 01/18/20 Yes Patwardhan, Manish J, MD  HYDROcodone-acetaminophen (NORCO) 7.5-325 MG tablet Take 1 tablet by mouth every 6 (six) hours as needed for moderate pain.  12/20/19   [provider]  megestrol (MEGACE) 40 MG tablet Take 40 mg by mouth 2 (two) times daily. 12/21/19   [provider]  oxyCODONE-acetaminophen (PERCOCET/ROXICET) 5-325 MG tablet Take 1 tablet by mouth every 6 (six) hours as needed for severe pain. Patient not taking: Reported on 12/22/2019 12/17/19   Maia Plan, MD  pantoprazole (PROTONIX) 20 MG tablet Take 20 mg by mouth daily. 12/20/19   [provider]  varenicline (CHANTIX STARTING MONTH PAK) 0.5 MG X 11 & 1 MG X 42 tablet Take one 0.5 mg tablet by mouth once daily for 3 days, then increase to one 0.5 mg tablet twice daily for 4 days, then increase to one 1 mg tablet twice daily. Patient not taking: Reported on 12/17/2019 11/07/19   [name], MD    Physical Exam: BP (!) 187/92   Pulse 84   Temp 99.6 F (37.6 C) (Oral)   Resp (!) 25   Ht 6' (1.829 m)   Wt 63.5 kg   SpO2 99%   BMI 18.99 kg/m   . General: Older male. Awake and alert and oriented x2. No acute cardiopulmonary distress.  Marland Kitchen HEENT: Normocephalic atraumatic.  Right and left ears normal in appearance.  Pupils equal, round, reactive to light. Extraocular muscles are intact. Sclerae anicteric and noninjected.  Moist mucosal membranes. No mucosal lesions.  . Neck: Neck supple without lymphadenopathy. No carotid bruits. No masses palpated.  . Cardiovascular:  Regular rate with normal S1-S2 sounds. No murmurs, rubs, gallops auscultated. No JVD.  Marland Kitchen Respiratory: Good respiratory effort with no wheezes, rales, rhonchi. Lungs clear to auscultation bilaterally.  No accessory muscle use. . Abdomen: Soft, nontender, nondistended. Active bowel sounds. No masses or hepatosplenomegaly  . Skin: No rashes, lesions, or ulcerations.  Dry, warm to touch. 2+ dorsalis pedis and radial pulses. . Musculoskeletal: No calf or leg pain. All major joints not erythematous nontender.  No upper or lower joint deformation.  Good ROM.  No contractures  . Psychiatric: Patient confused. . Neurologic: No focal neurological deficits. Strength is 5/5 and symmetric in upper and lower extremities.  Cranial nerves II through XII are grossly intact.           Labs on Admission: I have personally reviewed following labs and imaging studies  CBC: Recent Labs  Lab 12/17/19 2115 12/22/19 1550  WBC 8.1 11.3*  NEUTROABS 5.4 9.6*  HGB 12.4* 12.9*  HCT 37.0* 37.3*  MCV 82.4  79.5*  PLT 289 329   Basic Metabolic Panel: Recent Labs  Lab 12/17/19 2115 12/22/19 1550  NA 127* 120*  K 4.0 4.2  CL 99 86*  CO2 19* 22  GLUCOSE 91 149*  BUN 9 13  CREATININE 0.94 1.00  CALCIUM 8.7* 9.1   GFR: Estimated Creatinine Clearance: 69.7 mL/min (by C-G formula based on SCr of 1 mg/dL). Liver Function Tests: Recent Labs  Lab 12/22/19 1550  AST 15  ALT 10  ALKPHOS 58  BILITOT 0.8  PROT 8.6*  ALBUMIN 3.5   No results for input(s): LIPASE, AMYLASE in the last 168 hours. Recent Labs  Lab 12/22/19 1555  AMMONIA 17   Coagulation Profile: Recent Labs  Lab 12/22/19 1550  INR 1.1   Cardiac Enzymes: No results for input(s): CKTOTAL, CKMB, CKMBINDEX, TROPONINI in the last 168 hours. BNP (last 3 results) No results for input(s): PROBNP in the last 8760 hours. HbA1C: No results for input(s): HGBA1C in the last 72 hours. CBG: No results for input(s): GLUCAP in the last 168  hours. Lipid Profile: No results for input(s): CHOL, HDL, LDLCALC, TRIG, CHOLHDL, LDLDIRECT in the last 72 hours. Thyroid Function Tests: No results for input(s): TSH, T4TOTAL, FREET4, T3FREE, THYROIDAB in the last 72 hours. Anemia Panel: No results for input(s): VITAMINB12, FOLATE, FERRITIN, TIBC, IRON, RETICCTPCT in the last 72 hours. Urine analysis:    Component Value Date/Time   COLORURINE YELLOW 12/22/2019 1428   APPEARANCEUR CLEAR 12/22/2019 1428   LABSPEC 1.021 12/22/2019 1428   PHURINE 6.0 12/22/2019 1428   GLUCOSEU 50 (A) 12/22/2019 1428   HGBUR SMALL (A) 12/22/2019 1428   BILIRUBINUR NEGATIVE 12/22/2019 1428   KETONESUR NEGATIVE 12/22/2019 1428   PROTEINUR 100 (A) 12/22/2019 1428   UROBILINOGEN 0.2 11/05/2014 1025   NITRITE NEGATIVE 12/22/2019 1428   LEUKOCYTESUR NEGATIVE 12/22/2019 1428   Sepsis Labs: @LABRCNTIP (procalcitonin:4,lacticidven:4) ) Recent Results (from the past 240 hour(s))  SARS Coronavirus 2 by RT PCR (hospital order, performed in [facility] hospital lab) Nasopharyngeal Nasopharyngeal Swab     Status: None   Collection Time: 12/22/19  3:37 PM   Specimen: Nasopharyngeal Swab  Result Value Ref Range Status   SARS Coronavirus 2 NEGATIVE NEGATIVE Final    Comment: (NOTE) SARS-CoV-2 target nucleic acids are NOT DETECTED. The SARS-CoV-2 RNA is generally detectable in upper and lower respiratory specimens during the acute phase of infection. The lowest concentration of SARS-CoV-2 viral copies this assay can detect is 250 copies / mL. A negative result does not preclude SARS-CoV-2 infection and should not be used as the sole basis for treatment or other patient management decisions.  A negative result may occur with improper specimen collection / handling, submission of specimen other than nasopharyngeal swab, presence of viral mutation(s) within the areas targeted by this assay, and inadequate number of viral copies (<250 copies / mL). A negative result  must be combined with clinical observations, patient history, and epidemiological information. Fact Sheet for Patients:   12/24/19 Fact Sheet for Healthcare Providers: BoilerBrush.com.cy This test is not yet approved or cleared  by the https://pope.com/ FDA and has been authorized for detection and/or diagnosis of SARS-CoV-2 by FDA under an Emergency Use Authorization (EUA).  This EUA will remain in effect (meaning this test can be used) for the duration of the COVID-19 declaration under Section 564(b)(1) of the Act, 21 U.S.C. section 360bbb-3(b)(1), unless the authorization is terminated or revoked sooner. Performed at [facility], [address], [place], [place]  Radiological Exams on Admission: DG Chest 1 View  Result Date: 12/22/2019 CLINICAL DATA:  Headaches and dizziness EXAM: CHEST  1 VIEW COMPARISON:  10/24/2019 FINDINGS: The heart size and mediastinal contours are within normal limits. Both lungs are clear. The visualized skeletal structures are unremarkable. IMPRESSION: No active disease. Electronically Signed   By: Alcide Clever M.D.   On: 12/22/2019 15:43   CT HEAD WO CONTRAST  Result Date: 12/22/2019 CLINICAL DATA:  Headache, dizziness, altered level of consciousness EXAM: CT HEAD WITHOUT CONTRAST TECHNIQUE: Contiguous axial images were obtained from the base of the skull through the vertex without intravenous contrast. COMPARISON:  12/17/2019, 06/07/2019 FINDINGS: Brain: Evaluation is limited due to patient motion during the exam. No evidence of acute infarct or hemorrhage. The lateral ventricles and midline structures are grossly unremarkable. There are no acute extra-axial fluid collections. No mass effect. Vascular: No hyperdense vessel or unexpected calcification. Skull: Normal. Negative for fracture or focal lesion. Sinuses/Orbits: The destructive process centered in the left middle ear seen on prior CT head  and temporal bone examinations is unchanged. Chronic opacification of the left mastoid air cells. The remainder of the paranasal sinuses and right mastoid air cells are unremarkable. Other: None. IMPRESSION: 1. Limited study due to patient motion. 2. No acute infarct or hemorrhage. 3. Stable destructive process centered in the left middle ear, compatible with cholesteatoma reported on prior temporal bone CT 06/07/2019. Electronically Signed   By: Sharlet Salina M.D.   On: 12/22/2019 15:41    EKG: Independently reviewed.  Sinus rhythm with old atrial septal infarct  Assessment/Plan: Principal Problem:   Acute hyponatremia Active Problems:   Essential hypertension, benign   History of non-ST elevation myocardial infarction (NSTEMI)   Alcohol dependence (HCC)   Coronary artery disease of native artery of native heart with stable angina pectoris (HCC)   Prediabetes    This patient was discussed with the ED physician, including pertinent vitals, physical exam findings, labs, and imaging.  We also discussed care given by the ED provider.  1. Acute hyponatremia a. Appears euvolemic b. Admit, will likely need at least 2 days for regulation of sodium levels c. Fluid restrict d. Strict I's and O's e. Check sodium levels every 6 hours f. Patient is been on lisinopril since March, which is the likely etiology of the patient's hyponatremia g. Will hold lisinopril 2. CAD with history of NSTEMI a. Continue atorvastatin, metoprolol 3. Hypertension a. Toprol all with as needed hydralazine 4. Alcohol dependence a. Withdrawal protocol 5. Prediabetes  DVT prophylaxis: Lovenox Consultants: None Code Status: Full code Family Communication: Wife present Disposition Plan: Should be able to return home following stabilization of sodium level   Levie Heritage, DO

## 2019-12-23 ENCOUNTER — Encounter (HOSPITAL_COMMUNITY): Payer: Self-pay | Admitting: Family Medicine

## 2019-12-23 ENCOUNTER — Other Ambulatory Visit: Payer: Self-pay

## 2019-12-23 LAB — BASIC METABOLIC PANEL
Anion gap: 11 (ref 5–15)
Anion gap: 11 (ref 5–15)
Anion gap: 10 (ref 5–15)
BUN: 12 mg/dL (ref 8–23)
BUN: 12 mg/dL (ref 8–23)
BUN: 11 mg/dL (ref 8–23)
CO2: 21 mmol/L — ABNORMAL LOW (ref 22–32)
CO2: 21 mmol/L — ABNORMAL LOW (ref 22–32)
CO2: 19 mmol/L — ABNORMAL LOW (ref 22–32)
Calcium: 8.9 mg/dL (ref 8.9–10.3)
Calcium: 8.8 mg/dL — ABNORMAL LOW (ref 8.9–10.3)
Calcium: 8.5 mg/dL — ABNORMAL LOW (ref 8.9–10.3)
Chloride: 87 mmol/L — ABNORMAL LOW (ref 98–111)
Chloride: 87 mmol/L — ABNORMAL LOW (ref 98–111)
Chloride: 90 mmol/L — ABNORMAL LOW (ref 98–111)
Creatinine, Ser: 0.8 mg/dL (ref 0.61–1.24)
Creatinine, Ser: 0.75 mg/dL (ref 0.61–1.24)
Creatinine, Ser: 0.67 mg/dL (ref 0.61–1.24)
GFR calc Af Amer: >60 mL/min (ref >60)
GFR calc Af Amer: >60 mL/min (ref >60)
GFR calc Af Amer: >60 mL/min (ref >60)
GFR calc non Af Amer: >60 mL/min (ref >60)
GFR calc non Af Amer: >60 mL/min (ref >60)
GFR calc non Af Amer: >60 mL/min (ref >60)
Glucose, Bld: 172 mg/dL — ABNORMAL HIGH (ref 70–99)
Glucose, Bld: 142 mg/dL — ABNORMAL HIGH (ref 70–99)
Glucose, Bld: 145 mg/dL — ABNORMAL HIGH (ref 70–99)
Potassium: 4 mmol/L (ref 3.5–5.1)
Potassium: 4.1 mmol/L (ref 3.5–5.1)
Potassium: 4 mmol/L (ref 3.5–5.1)
Sodium: 119 mmol/L — CL (ref 135–145)
Sodium: 119 mmol/L — CL (ref 135–145)
Sodium: 119 mmol/L — CL (ref 135–145)

## 2019-12-23 LAB — RAPID URINE DRUG SCREEN, HOSP PERFORMED
Amphetamines: NOT DETECTED
Barbiturates: NOT DETECTED
Benzodiazepines: POSITIVE — AB
Cocaine: NOT DETECTED
Opiates: NOT DETECTED
Tetrahydrocannabinol: NOT DETECTED

## 2019-12-23 LAB — SODIUM, URINE, RANDOM: Sodium, Ur: 140 mmol/L

## 2019-12-23 LAB — CBC
HCT: 33.8 % — ABNORMAL LOW (ref 39.0–52.0)
Hemoglobin: 11.8 g/dL — ABNORMAL LOW (ref 13.0–17.0)
MCH: 27.4 pg (ref 26.0–34.0)
MCHC: 34.9 g/dL (ref 30.0–36.0)
MCV: 78.4 fL — ABNORMAL LOW (ref 80.0–100.0)
Platelets: 329 10*3/uL (ref 150–400)
RBC: 4.31 MIL/uL (ref 4.22–5.81)
RDW: 14.1 % (ref 11.5–15.5)
WBC: 13.8 10*3/uL — ABNORMAL HIGH (ref 4.0–10.5)
nRBC: 0 % (ref 0.0–0.2)

## 2019-12-23 LAB — MRSA PCR SCREENING: MRSA by PCR: NEGATIVE

## 2019-12-23 LAB — GLUCOSE, CAPILLARY
Glucose-Capillary: 148 mg/dL — ABNORMAL HIGH (ref 70–99)
Glucose-Capillary: 136 mg/dL — ABNORMAL HIGH (ref 70–99)

## 2019-12-23 LAB — OSMOLALITY, URINE: Osmolality, Ur: 813 mOsm/kg (ref 300–900)

## 2019-12-23 LAB — SODIUM: Sodium: 119 mmol/L — CL (ref 135–145)

## 2019-12-23 MED ORDER — PANTOPRAZOLE SODIUM 40 MG PO TBEC
40.0000 mg | DELAYED_RELEASE_TABLET | Freq: Every day | ORAL | Status: DC
  Administered 2019-12-25: 40 mg via ORAL
  Filled 2019-12-23 (×2): qty 1

## 2019-12-23 MED ORDER — FOLIC ACID 1 MG PO TABS
1.0000 mg | ORAL_TABLET | Freq: Every day | ORAL | Status: DC
  Administered 2019-12-25 – 2019-12-31 (×6): 1 mg via ORAL
  Filled 2019-12-23 (×7): qty 1

## 2019-12-23 MED ORDER — LABETALOL HCL 5 MG/ML IV SOLN
10.0000 mg | INTRAVENOUS | Status: DC | PRN
  Administered 2019-12-23 – 2019-12-29 (×8): 10 mg via INTRAVENOUS
  Filled 2019-12-23 (×7): qty 4

## 2019-12-23 MED ORDER — THIAMINE HCL 100 MG PO TABS
100.0000 mg | ORAL_TABLET | Freq: Every day | ORAL | Status: DC
  Administered 2019-12-25 – 2019-12-31 (×5): 100 mg via ORAL
  Filled 2019-12-23 (×6): qty 1

## 2019-12-23 MED ORDER — METOPROLOL TARTRATE 5 MG/5ML IV SOLN
5.0000 mg | Freq: Four times a day (QID) | INTRAVENOUS | Status: DC
  Administered 2019-12-23 – 2019-12-27 (×15): 5 mg via INTRAVENOUS
  Filled 2019-12-23 (×15): qty 5

## 2019-12-23 MED ORDER — NITROGLYCERIN 2 % TD OINT
1.0000 [in_us] | TOPICAL_OINTMENT | Freq: Three times a day (TID) | TRANSDERMAL | Status: AC
  Administered 2019-12-23 – 2019-12-25 (×6): 1 [in_us] via TOPICAL
  Filled 2019-12-23 (×6): qty 1

## 2019-12-23 MED ORDER — ADULT MULTIVITAMIN W/MINERALS CH
1.0000 | ORAL_TABLET | Freq: Every day | ORAL | Status: DC
  Administered 2019-12-25 – 2019-12-31 (×6): 1 via ORAL
  Filled 2019-12-23 (×7): qty 1

## 2019-12-23 MED ORDER — LABETALOL HCL 5 MG/ML IV SOLN
INTRAVENOUS | Status: AC
  Filled 2019-12-23: qty 4

## 2019-12-23 MED ORDER — THIAMINE HCL 100 MG/ML IJ SOLN
Freq: Once | INTRAVENOUS | Status: AC
  Filled 2019-12-23: qty 1000

## 2019-12-23 MED ORDER — SODIUM CHLORIDE 0.9 % IV SOLN: INTRAVENOUS | Status: DC

## 2019-12-23 MED ORDER — HYDRALAZINE HCL 20 MG/ML IJ SOLN
10.0000 mg | Freq: Once | INTRAMUSCULAR | Status: AC
  Administered 2019-12-23: 10 mg via INTRAVENOUS
  Filled 2019-12-23: qty 1

## 2019-12-23 MED ORDER — PIPERACILLIN-TAZOBACTAM 3.375 G IVPB
3.3750 g | Freq: Three times a day (TID) | INTRAVENOUS | Status: DC
  Administered 2019-12-23 – 2019-12-26 (×10): 3.375 g via INTRAVENOUS
  Filled 2019-12-23 (×10): qty 50

## 2019-12-23 MED ORDER — THIAMINE HCL 100 MG/ML IJ SOLN
100.0000 mg | Freq: Every day | INTRAMUSCULAR | Status: DC
  Administered 2019-12-24 – 2019-12-27 (×2): 100 mg via INTRAVENOUS
  Filled 2019-12-23 (×2): qty 2

## 2019-12-23 MED ORDER — ACETAMINOPHEN 650 MG RE SUPP
650.0000 mg | Freq: Once | RECTAL | Status: AC
  Administered 2019-12-23: 650 mg via RECTAL
  Filled 2019-12-23: qty 1

## 2019-12-23 MED ORDER — LABETALOL HCL 5 MG/ML IV SOLN
10.0000 mg | Freq: Once | INTRAVENOUS | Status: AC
  Administered 2019-12-23: 10 mg via INTRAVENOUS
  Filled 2019-12-23: qty 4

## 2019-12-23 MED ORDER — INSULIN ASPART 100 UNIT/ML ~~LOC~~ SOLN: 0.0000 [IU] | Freq: Every day | SUBCUTANEOUS | Status: DC

## 2019-12-23 MED ORDER — CHLORHEXIDINE GLUCONATE 0.12 % MT SOLN
15.0000 mL | Freq: Two times a day (BID) | OROMUCOSAL | Status: DC
  Administered 2019-12-23 – 2019-12-27 (×10): 15 mL via OROMUCOSAL
  Filled 2019-12-23 (×10): qty 15

## 2019-12-23 MED ORDER — CHLORHEXIDINE GLUCONATE CLOTH 2 % EX PADS
6.0000 | MEDICATED_PAD | Freq: Every day | CUTANEOUS | Status: DC
  Administered 2019-12-23 – 2019-12-31 (×9): 6 via TOPICAL

## 2019-12-23 MED ORDER — ORAL CARE MOUTH RINSE
15.0000 mL | Freq: Two times a day (BID) | OROMUCOSAL | Status: DC
  Administered 2019-12-23 – 2019-12-26 (×7): 15 mL via OROMUCOSAL

## 2019-12-23 MED ORDER — INSULIN ASPART 100 UNIT/ML ~~LOC~~ SOLN: 0.0000 [IU] | Freq: Four times a day (QID) | SUBCUTANEOUS | Status: DC

## 2019-12-23 NOTE — Progress Notes (Signed)
CRITICAL VALUE ALERT  Critical Value:  NA 119  Date & Time Notied:  12/23/19   2342  Provider Notified: Bruna Potter Midlevel  Orders Received/Actions taken: Awaiting order/response

## 2019-12-23 NOTE — Progress Notes (Signed)
CRITICAL VALUE ALERT  Critical Value:  NA  Date & Time Notied:  1:45 AM 12/23/19   Provider Notified: kahn  Orders Received/Actions taken: see emar

## 2019-12-23 NOTE — Progress Notes (Signed)
Patient admitted d/t hyponatremia, paged MD regarding no IVF orders. No new orders received.

## 2019-12-23 NOTE — Progress Notes (Signed)
Unable to notify family at this time about transfer to stepdown. Voicemail left for significant other to return call.

## 2019-12-23 NOTE — Progress Notes (Addendum)
CRITICAL VALUE ALERT  Critical Value:  Sodium 119  Date & Time Notied:  12/23/19 @ 0750.  Provider Notified: Mariea Clonts, MD.  Orders Received/Actions taken: Started on NS infusion.

## 2019-12-23 NOTE — Progress Notes (Signed)
Pt has not voided hardly any all shift, with only around 100 mL in catheter bag all shift so far. Bladder scan around 1300 showed 189 mL, bladder scanned again around 1700 and it showed >200 mL. MD made aware. An order for in and out cath placed. 450 mL of malodorous, orange urine removed. Will continue to monitor.

## 2019-12-23 NOTE — Progress Notes (Signed)
Patient Michael Rivas and BP 173/100s. Welton Flakes MD notified. One time dose of 10mg  Hydralazine ordered and given. MD aware of increasing Michael score. Ativan administered as ordered. Continue to monitor.

## 2019-12-23 NOTE — Progress Notes (Signed)
Patient become more agitated and confused. Decreased responsiveness from admission at current time patient will only grunt. Noted possible signs of aspiration. Notified Dr. Welton Flakes. Patient temp elevated at 101.4.   Patient noted to have increased tremors. Dr. Welton Flakes up to evaluate patient. Blood cultures ordered. Tylenol given. Order to transfer to stepdown.

## 2019-12-23 NOTE — Progress Notes (Signed)
Patient Demographics:    Michael Rivas, is a 64 y.o. male, DOB - [birth year], PPI:[id number]  Admit date - 12/22/2019   Admitting Physician Levie Heritage, DO  Outpatient Primary MD for the patient is [name], MD  LOS - 1   Chief Complaint  Patient presents with  . Altered Mental Status        Subjective:    Michael Rivas today has no fevers, no emesis,  No chest pain,   --Girlfriend at bedside--- some days patient drinks more than 200 ounces of beer --Patient with urinary retention, Foley placed, 450 mL of urine retrieved -   Assessment  & Plan :    Principal Problem:   Acute hyponatremia Active Problems:   Essential hypertension, benign   History of non-ST elevation myocardial infarction (NSTEMI)   Alcohol dependence (HCC)   Coronary artery disease of native artery of native heart with stable angina pectoris (HCC)   Prediabetes   Cholesteatoma  Brief Summary -64 y.o. male with a history of CAD s/p recent NSTEMI, (10/2019), HTN, glucose intolerance, alcohol and tobacco abuse admitted on 12/22/2019 with hyponatremia with sodium around 120   A/p 1)Persistent Hyponatremia--- suspect beer Potomania -serial sodium noted to be in the range of 119 (patient sodium usually runs between 127 and 130) -Continue normal saline -Consider 3% saline if seizures or worsening neuro symptoms  2)Acute metabolic encephalopathy--suspect due to #1 above -Patient is at risk for alcohol withdrawal as well -CT head without acute findings, demonstrates left middle ear cholesteatoma  3)Glucose intolerance--- A1c was 6.3 on 10/25/2019 - allow some permissive hyperglycemia due to altered mentation with no oral intake at this time  -use Novolog/Humalog Sliding scale insulin with Accu-Cheks/Fingersticks as ordered   4)Febrile illness--WBC 13.8, blood cultures pending, chest x-ray unremarkable , UA suspicious for  UTI -IV Zosyn pending culture results  5)H/o CAD--s/p NSTEMI 10/2019-- s/p LHC: "Partially successful angioplasty LCx. Residual calcific 50-60% stenosis, which will need atherectomy at a staged procedure. Also has severe RCA disease. Continue medical management for now with Aspirin/Brilinta/high intensity statin/beta blocker." -Give IV metoprolol until mentation improves enough to take oral intake  6)Etoh Abuse--- heavy alcohol use, high risk for DTs, patient received IV banana bag, continue lorazepam per CIWA protocol, continue multivitamin  7)HTN-2 uncontrolled, IV metoprolol scheduled for CAD as above #5 -May use IV labetalol as needed elevated BP, watch for bradycardia  8)Left side middle ear and mastoid cholesteatoma - Followed by ENT (Dr. Serena Colonel) who recommended tympanomastoidectomy back in 06/21/19; he has been unable to complete this surgery d/t sevral cancellations d/t low sodium levels  Disposition/Need for in-Hospital Stay- patient unable to be discharged at this time due to -persistent and symptomatic hyponatremia requiring IV fluids -Patient From: home D/C Place: home Barriers: Not Clinically Stable-   Code Status : full  Family Communication:   Discussed with his live-in girlfriend at bedside  Consults  :  na  DVT Prophylaxis  :  Lovenox -  - SCDs  Lab Results  Component Value Date   PLT 329 12/23/2019    Inpatient Medications  Scheduled Meds: . aspirin EC  81 mg Oral Daily  . atorvastatin  80 mg Oral q1800  . chlorhexidine  15 mL  Mouth Rinse BID  . Chlorhexidine Gluconate Cloth  6 each Topical Daily  . enoxaparin (LOVENOX) injection  40 mg Subcutaneous QHS  . [START ON 12/24/2019] folic acid  1 mg Oral Daily  . mouth rinse  15 mL Mouth Rinse q12n4p  . megestrol  40 mg Oral BID  . metoprolol succinate  25 mg Oral q morning - 10a  . [START ON 12/24/2019] multivitamin with minerals  1 tablet Oral Daily  . pantoprazole  40 mg Oral Daily  . [START ON  12/24/2019] thiamine  100 mg Oral Daily   Or  . [START ON 12/24/2019] thiamine  100 mg Intravenous Daily  . ticagrelor  90 mg Oral BID   Continuous Infusions: . sodium chloride Stopped (12/23/19 1227)  . piperacillin-tazobactam (ZOSYN)  IV 12.5 mL/hr at 12/23/19 1539   PRN Meds:.HYDROcodone-acetaminophen, labetalol, LORazepam **OR** LORazepam, ondansetron **OR** ondansetron (ZOFRAN) IV, ondansetron    Anti-infectives (From admission, onward)   Start     Dose/Rate Route Frequency Ordered Stop   12/23/19 0400  piperacillin-tazobactam (ZOSYN) IVPB 3.375 g     3.375 g 12.5 mL/hr over 240 Minutes Intravenous Every 8 hours 12/23/19 0325          Objective:   Vitals:   12/23/19 1610 12/23/19 1630 12/23/19 1700 12/23/19 1730  BP:  (!) 169/92 (!) 185/108 (!) 158/77  Pulse: 85 86 87 79  Resp: (!) 23 19 (!) 25 (!) 21  Temp: 99.7 F (37.6 C)     TempSrc: Axillary     SpO2: 98% 99% 98% 98%  Weight:      Height:        Wt Readings from Last 3 Encounters:  12/23/19 62.1 kg  12/17/19 68 kg  11/07/19 68.9 kg     Intake/Output Summary (Last 24 hours) at 12/23/2019 1748 Last data filed at 12/23/2019 1730 Gross per 24 hour  Intake 1087.02 ml  Output 450 ml  Net 637.02 ml     Physical Exam  Gen:-Lethargic,  HEENT:- [place].AT, No sclera icterus Neck-Supple Neck,No JVD,.  Lungs-  CTAB , fair symmetrical air movement CV- S1, S2 normal, regular  Abd-  +ve B.Sounds, Abd Soft, No tenderness,    Extremity/Skin:- No  edema, pedal pulses present  Psych-remains sleepy Neuro-Limited exam as patient is lethargic no new focal deficits,    Data Review:   Micro Results Recent Results (from the past 240 hour(s))  SARS Coronavirus 2 by RT PCR (hospital order, performed in [facility] hospital lab) Nasopharyngeal Nasopharyngeal Swab     Status: None   Collection Time: 12/22/19  3:37 PM   Specimen: Nasopharyngeal Swab  Result Value Ref Range Status   SARS Coronavirus 2 NEGATIVE NEGATIVE Final     Comment: (NOTE) SARS-CoV-2 target nucleic acids are NOT DETECTED. The SARS-CoV-2 RNA is generally detectable in upper and lower respiratory specimens during the acute phase of infection. The lowest concentration of SARS-CoV-2 viral copies this assay can detect is 250 copies / mL. A negative result does not preclude SARS-CoV-2 infection and should not be used as the sole basis for treatment or other patient management decisions.  A negative result may occur with improper specimen collection / handling, submission of specimen other than nasopharyngeal swab, presence of viral mutation(s) within the areas targeted by this assay, and inadequate number of viral copies (<250 copies / mL). A negative result must be combined with clinical observations, patient history, and epidemiological information. Fact Sheet for Patients:   BoilerBrush.com.cy Fact Sheet  for Healthcare Providers: https://pope.com/ This test is not yet approved or cleared  by the [place] and has been authorized for detection and/or diagnosis of SARS-CoV-2 by FDA under an Emergency Use Authorization (EUA).  This EUA will remain in effect (meaning this test can be used) for the duration of the COVID-19 declaration under Section 564(b)(1) of the Act, 21 U.S.C. section 360bbb-3(b)(1), unless the authorization is terminated or revoked sooner. Performed at [facility], [address], [place], [place] [address]   MRSA PCR Screening     Status: None   Collection Time: 12/23/19  4:20 AM   Specimen: Nasal Mucosa; Nasopharyngeal  Result Value Ref Range Status   MRSA by PCR NEGATIVE NEGATIVE Final    Comment:        The GeneXpert MRSA Assay (FDA approved for NASAL specimens only), is one component of a comprehensive MRSA colonization surveillance program. It is not intended to diagnose MRSA infection nor to guide or monitor treatment for MRSA infections. Performed at  [facility], [address], [place], [place] [address]   Culture, blood (routine x 2)     Status: None (Preliminary result)   Collection Time: 12/23/19  5:06 AM   Specimen: Left Antecubital; Blood  Result Value Ref Range Status   Specimen Description   Final    LEFT ANTECUBITAL BOTTLES DRAWN AEROBIC AND ANAEROBIC   Special Requests   Final    Blood Culture adequate volume Performed at [facility], [address], [place], [place] [address]    Culture PENDING  Incomplete   Report Status PENDING  Incomplete  Culture, blood (routine x 2)     Status: None (Preliminary result)   Collection Time: 12/23/19  6:03 AM   Specimen: Left Antecubital; Blood  Result Value Ref Range Status   Specimen Description   Final    LEFT ANTECUBITAL BOTTLES DRAWN AEROBIC AND ANAEROBIC   Special Requests   Final    Blood Culture adequate volume Performed at [facility], [address], [place], [place] [address]    Culture PENDING  Incomplete   Report Status PENDING  Incomplete    Radiology Reports DG Chest 1 View  Result Date: 12/22/2019 CLINICAL DATA:  Headaches and dizziness EXAM: CHEST  1 VIEW COMPARISON:  10/24/2019 FINDINGS: The heart size and mediastinal contours are within normal limits. Both lungs are clear. The visualized skeletal structures are unremarkable. IMPRESSION: No active disease. Electronically Signed   By: Alcide Clever M.D.   On: 12/22/2019 15:43   CT HEAD WO CONTRAST  Result Date: 12/22/2019 CLINICAL DATA:  Headache, dizziness, altered level of consciousness EXAM: CT HEAD WITHOUT CONTRAST TECHNIQUE: Contiguous axial images were obtained from the base of the skull through the vertex without intravenous contrast. COMPARISON:  12/17/2019, 06/07/2019 FINDINGS: Brain: Evaluation is limited due to patient motion during the exam. No evidence of acute infarct or hemorrhage. The lateral ventricles and midline structures are grossly unremarkable. There are no acute extra-axial fluid collections.  No mass effect. Vascular: No hyperdense vessel or unexpected calcification. Skull: Normal. Negative for fracture or focal lesion. Sinuses/Orbits: The destructive process centered in the left middle ear seen on prior CT head and temporal bone examinations is unchanged. Chronic opacification of the left mastoid air cells. The remainder of the paranasal sinuses and right mastoid air cells are unremarkable. Other: None. IMPRESSION: 1. Limited study due to patient motion. 2. No acute infarct or hemorrhage. 3. Stable destructive process centered in the left middle ear, compatible  with cholesteatoma reported on prior temporal bone CT 06/07/2019. Electronically Signed   By: Sharlet Salina M.D.   On: 12/22/2019 15:41   CT Head Wo Contrast  Result Date: 12/17/2019 CLINICAL DATA:  Ear pain and headache for several days, acutely worsening EXAM: CT HEAD WITHOUT CONTRAST TECHNIQUE: Contiguous axial images were obtained from the base of the skull through the vertex without intravenous contrast. COMPARISON:  Temporal bone CT 11 5 2020 FINDINGS: Brain: No evidence of acute infarction, hemorrhage, hydrocephalus, extra-axial collection or mass lesion/mass effect. Vascular: Atherosclerotic calcification of the carotid siphons. No hyperdense vessel. Skull: No calvarial fracture or suspicious osseous lesion. No scalp swelling or hematoma. Sinuses/Orbits: Extensive opacity seen throughout the left middle ear cavity with complete opacification of the left mastoid air cells and some hyperostotic changes favoring chronicity, with similar findings to the comparison CT temporal bone 06/08/2019. There is opacification of much of the attic of the middle ear cavity with destructive changes of the ossicular chain as well as progressive destructive changes along the left semi circular canals including a questionable focus of dehiscence along sigmoid plate. There is chronic dehiscence of the tympanic plate anteriorly along the posterior fossa  the temporomandibular joint. The tegmen tympani appears grossly intact on these non tailored images. The right mastoid air cells and paranasal sinuses are predominantly clear. Included orbital structures are unremarkable. Other: None IMPRESSION: 1. No acute intracranial abnormality. 2. Extensive opacity throughout the left middle ear cavity compatible with patient's known cholesteatoma. 3. Progressive destructive changes along the left ossicular chain. 4. Progressive destructive changes along the left semi circular canals including a questionable focus of dehiscence along the sigmoid plate. 5. Chronic dehiscence of the tympanic plate anteriorly along the posterior fossa the temporomandibular joint. 6. Consider dedicated temporal bone CT for better interrogation of the sigmoid dehiscence. This study may be reconstructed from these images. Electronically Signed   By: Kreg Shropshire M.D.   On: 12/17/2019 22:10     CBC Recent Labs  Lab 12/17/19 2115 12/22/19 1550 12/23/19 0020  WBC 8.1 11.3* 13.8*  HGB 12.4* 12.9* 11.8*  HCT 37.0* 37.3* 33.8*  PLT 289 329 329  MCV 82.4 79.5* 78.4*  MCH 27.6 27.5 27.4  MCHC 33.5 34.6 34.9  RDW 14.6 14.4 14.1  LYMPHSABS 1.9 1.0  --   MONOABS 0.6 0.6  --   EOSABS 0.1 0.0  --   BASOSABS 0.0 0.0  --     Chemistries  Recent Labs  Lab 12/17/19 2115 12/22/19 1550 12/22/19 1843 12/23/19 0020 12/23/19 0508  NA 127* 120* 119* 119* 119*  K 4.0 4.2  --   --  4.0  CL 99 86*  --   --  87*  CO2 19* 22  --   --  21*  GLUCOSE 91 149*  --   --  172*  BUN 9 13  --   --  12  CREATININE 0.94 1.00  --   --  0.80  CALCIUM 8.7* 9.1  --   --  8.9  MG  --   --  1.6*  --   --   AST  --  15  --   --   --   ALT  --  10  --   --   --   ALKPHOS  --  58  --   --   --   BILITOT  --  0.8  --   --   --    ------------------------------------------------------------------------------------------------------------------ No  results for input(s): CHOL, HDL, LDLCALC, TRIG, CHOLHDL,  LDLDIRECT in the last 72 hours.  Lab Results  Component Value Date   HGBA1C 6.3 (H) 10/25/2019   ------------------------------------------------------------------------------------------------------------------ No results for input(s): TSH, T4TOTAL, T3FREE, THYROIDAB in the last 72 hours.  Invalid input(s): FREET3 ------------------------------------------------------------------------------------------------------------------ No results for input(s): VITAMINB12, FOLATE, FERRITIN, TIBC, IRON, RETICCTPCT in the last 72 hours.  Coagulation profile Recent Labs  Lab 12/22/19 1550  INR 1.1    No results for input(s): DDIMER in the last 72 hours.  Cardiac Enzymes No results for input(s): CKMB, TROPONINI, MYOGLOBIN in the last 168 hours.  Invalid input(s): CK ------------------------------------------------------------------------------------------------------------------ No results found for: BNP   Roxan Hockey M.D on 12/23/2019 at 5:48 PM  Go to [email or web address] - for contact info  [facility] - Office  [phone]

## 2019-12-23 NOTE — Progress Notes (Signed)
Patient is restless and squirming around bed. Ativan given per orders. Tried po patient is chewing pills. Slow to respond to staff. Confused. Oral care provided.

## 2019-12-23 NOTE — Progress Notes (Signed)
Patient AMS, sedated and not oriented. Notified Blount MD of PO meds and inability to take PO meds at this time. Awaiting confirmation to hold meds. Continue to monitor.

## 2019-12-24 LAB — GLUCOSE, CAPILLARY
Glucose-Capillary: 129 mg/dL — ABNORMAL HIGH (ref 70–99)
Glucose-Capillary: 129 mg/dL — ABNORMAL HIGH (ref 70–99)
Glucose-Capillary: 130 mg/dL — ABNORMAL HIGH (ref 70–99)
Glucose-Capillary: 139 mg/dL — ABNORMAL HIGH (ref 70–99)
Glucose-Capillary: 142 mg/dL — ABNORMAL HIGH (ref 70–99)
Glucose-Capillary: 147 mg/dL — ABNORMAL HIGH (ref 70–99)

## 2019-12-24 LAB — BASIC METABOLIC PANEL
Anion gap: 8 (ref 5–15)
Anion gap: 9 (ref 5–15)
BUN: 11 mg/dL (ref 8–23)
BUN: 11 mg/dL (ref 8–23)
CO2: 21 mmol/L — ABNORMAL LOW (ref 22–32)
CO2: 19 mmol/L — ABNORMAL LOW (ref 22–32)
Calcium: 8.5 mg/dL — ABNORMAL LOW (ref 8.9–10.3)
Calcium: 8.2 mg/dL — ABNORMAL LOW (ref 8.9–10.3)
Chloride: 91 mmol/L — ABNORMAL LOW (ref 98–111)
Chloride: 91 mmol/L — ABNORMAL LOW (ref 98–111)
Creatinine, Ser: 0.74 mg/dL (ref 0.61–1.24)
Creatinine, Ser: 0.74 mg/dL (ref 0.61–1.24)
GFR calc Af Amer: >60 mL/min (ref >60)
GFR calc Af Amer: >60 mL/min (ref >60)
GFR calc non Af Amer: >60 mL/min (ref >60)
GFR calc non Af Amer: >60 mL/min (ref >60)
Glucose, Bld: 134 mg/dL — ABNORMAL HIGH (ref 70–99)
Glucose, Bld: 125 mg/dL — ABNORMAL HIGH (ref 70–99)
Potassium: 4 mmol/L (ref 3.5–5.1)
Potassium: 4 mmol/L (ref 3.5–5.1)
Sodium: 120 mmol/L — ABNORMAL LOW (ref 135–145)
Sodium: 119 mmol/L — CL (ref 135–145)

## 2019-12-24 MED ORDER — FUROSEMIDE 10 MG/ML IJ SOLN
20.0000 mg | Freq: Three times a day (TID) | INTRAMUSCULAR | Status: DC
  Administered 2019-12-24 – 2019-12-25 (×4): 20 mg via INTRAVENOUS
  Filled 2019-12-24 (×4): qty 2

## 2019-12-24 MED ORDER — CLONIDINE HCL 0.1 MG/24HR TD PTWK
0.1000 mg | MEDICATED_PATCH | TRANSDERMAL | Status: DC
  Administered 2019-12-24: 0.1 mg via TRANSDERMAL
  Filled 2019-12-24: qty 1

## 2019-12-24 MED ORDER — ACETAMINOPHEN 650 MG RE SUPP
650.0000 mg | RECTAL | Status: DC | PRN
  Administered 2019-12-24 – 2019-12-25 (×2): 650 mg via RECTAL
  Filled 2019-12-24 (×2): qty 1

## 2019-12-24 NOTE — Progress Notes (Signed)
Per Welton Flakes, MD, continue to monitor Sodium level. If morning lab results less than 119, notify him for new orders/interventions.

## 2019-12-24 NOTE — Progress Notes (Signed)
Patient with altered mental status. Responding by moaning when name called but not making out any words and not following commands. Unable to give PO meds due to mental status, Mouth care performed, patient resistant by biting down on suction toothbrush. Dr Marisa Severin aware of mental status and unable to give PO meds. Will continue to monitor.

## 2019-12-24 NOTE — Progress Notes (Signed)
CRITICAL VALUE ALERT  Critical Value:  Sodium 119  Date & Time Notied:  12/24/2019 @1745   Provider Notified: Dr  Orders Received/Actions taken: No new orders currently

## 2019-12-24 NOTE — Progress Notes (Signed)
No response or orders received from midlevel, Blount MD, in regard to critical sodium of 119. Paged Welton Flakes MD, awaiting response/orders.

## 2019-12-24 NOTE — Progress Notes (Signed)
Patient Demographics:    Michael Rivas, is a 64 y.o. male, DOB - 1956-03-09, GYJ:[id number]  Admit date - 12/22/2019   Admitting Physician No admitting provider for patient encounter.  Outpatient Primary MD for the patient is [name], MD  LOS - 2   Chief Complaint  Patient presents with  . Altered Mental Status        Subjective:    Michael Rivas today has no fevers, no emesis,  No chest pain,   --Girlfriend at bedside---  -Patient remains very lethargic-   Assessment  & Plan :    Principal Problem:   Acute hyponatremia Active Problems:   Essential hypertension, benign   History of non-ST elevation myocardial infarction (NSTEMI)   Alcohol dependence (HCC)   Coronary artery disease of native artery of native heart with stable angina pectoris (HCC)   Prediabetes   Cholesteatoma  Brief Summary -64 y.o. male with a history of CAD s/p recent NSTEMI, (10/2019), HTN, glucose intolerance, alcohol and tobacco abuse admitted on 12/22/2019 with hyponatremia with sodium around 120   A/p 1)Persistent Hyponatremia--- SIADH Versus beer Potomania -serial sodium noted to be in the range of 119 (patient sodium usually runs between 127 and 130) -Hydrated aggressively and hyponatremia persist -Urine osmolarity is 813 -Discussed with Dr. Kathrene Bongo the nephrologist, recommends discontinuation of IV fluids at this time and trial of Lasix 20 mg every 8 hours -  2)Acute metabolic encephalopathy--suspect due to #1 above -Patient is at risk for alcohol withdrawal as well -CT head without acute findings, demonstrates left middle ear cholesteatoma  3)Glucose intolerance--- A1c was 6.3 on 10/25/2019 - allow some permissive hyperglycemia due to altered mentation with no oral intake at this time  -use Novolog/Humalog Sliding scale insulin with Accu-Cheks/Fingersticks as ordered   4)Febrile illness--WBC 13.8,  blood cultures pending, chest x-ray unremarkable , UA suspicious for UTI -IV Zosyn pending culture results  5)H/o CAD--s/p NSTEMI 10/2019-- s/p LHC: "Partially successful angioplasty LCx. Residual calcific 50-60% stenosis, which will need atherectomy at a staged procedure. Also has severe RCA disease. Continue medical management for now with Aspirin/Brilinta/high intensity statin/beta blocker." -Give IV metoprolol until mentation improves enough to take oral intake  6)Etoh Abuse--- heavy alcohol use, high risk for DTs, patient received IV banana bag, continue lorazepam per CIWA protocol, continue multivitamin  7)HTN-2 uncontrolled, IV metoprolol scheduled for CAD as above #5 -We will add clonidine patch 0.1 mg for better BP control until the patient is awake enough to tolerate oral intake safely -May use IV labetalol as needed elevated BP, watch for bradycardia  8)Left side middle ear and mastoid cholesteatoma - Followed by ENT (Dr. Serena Colonel) who recommended tympanomastoidectomy back in 06/21/19; he has been unable to complete this surgery d/t sevral cancellations d/t low sodium levels  Disposition/Need for in-Hospital Stay- patient unable to be discharged at this time due to -persistent and symptomatic hyponatremia requiring IV fluids/IV Lasix -Patient From: home D/C Place: home Barriers: Not Clinically Stable-   Code Status : full  Family Communication:   Discussed with his live-in girlfriend [name] at bedside  Consults  :  na  DVT Prophylaxis  :  Lovenox -  - SCDs  Lab Results  Component Value Date   PLT 329  12/23/2019    Inpatient Medications  Scheduled Meds: . aspirin EC  81 mg Oral Daily  . atorvastatin  80 mg Oral q1800  . chlorhexidine  15 mL Mouth Rinse BID  . Chlorhexidine Gluconate Cloth  6 each Topical Daily  . cloNIDine  0.1 mg Transdermal Weekly  . enoxaparin (LOVENOX) injection  40 mg Subcutaneous QHS  . folic acid  1 mg Oral Daily  . furosemide  20 mg  Intravenous Q8H  . insulin aspart  0-5 Units Subcutaneous QID  . insulin aspart  0-5 Units Subcutaneous QHS  . mouth rinse  15 mL Mouth Rinse q12n4p  . megestrol  40 mg Oral BID  . metoprolol tartrate  5 mg Intravenous Q6H  . multivitamin with minerals  1 tablet Oral Daily  . nitroGLYCERIN  1 inch Topical Q8H  . pantoprazole  40 mg Oral Daily  . thiamine  100 mg Oral Daily   Or  . thiamine  100 mg Intravenous Daily  . ticagrelor  90 mg Oral BID   Continuous Infusions: . piperacillin-tazobactam (ZOSYN)  IV Stopped (12/24/19 1740)   PRN Meds:.HYDROcodone-acetaminophen, labetalol, LORazepam **OR** LORazepam, ondansetron **OR** ondansetron (ZOFRAN) IV, ondansetron    Anti-infectives (From admission, onward)   Start     Dose/Rate Route Frequency Ordered Stop   12/23/19 0400  piperacillin-tazobactam (ZOSYN) IVPB 3.375 g     3.375 g 12.5 mL/hr over 240 Minutes Intravenous Every 8 hours 12/23/19 0325          Objective:   Vitals:   12/24/19 1633 12/24/19 1700 12/24/19 1725 12/24/19 1730  BP:  (!) 167/75  (!) 179/81  Pulse:  89  86  Resp:    (!) 21  Temp: 99 F (37.2 C)  99 F (37.2 C)   TempSrc: Axillary  Axillary   SpO2:  94%  95%  Weight:      Height:        Wt Readings from Last 3 Encounters:  12/23/19 62.1 kg  12/17/19 68 kg  11/07/19 68.9 kg     Intake/Output Summary (Last 24 hours) at 12/24/2019 2012 Last data filed at 12/24/2019 1920 Gross per 24 hour  Intake 1842.95 ml  Output 1100 ml  Net 742.95 ml     Physical Exam  Gen:-Lethargic,  HEENT:- [place].AT, No sclera icterus Neck-Supple Neck,No JVD,.  Lungs-  CTAB , fair symmetrical air movement CV- S1, S2 normal, regular  Abd-  +ve B.Sounds, Abd Soft, No tenderness,    Extremity/Skin:- No  edema, pedal pulses present  Psych-remains sleepy Neuro-Limited exam as patient is lethargic no new focal deficits,    Data Review:   Micro Results Recent Results (from the past 240 hour(s))  SARS Coronavirus 2 by  RT PCR (hospital order, performed in [facility] hospital lab) Nasopharyngeal Nasopharyngeal Swab     Status: None   Collection Time: 12/22/19  3:37 PM   Specimen: Nasopharyngeal Swab  Result Value Ref Range Status   SARS Coronavirus 2 NEGATIVE NEGATIVE Final    Comment: (NOTE) SARS-CoV-2 target nucleic acids are NOT DETECTED. The SARS-CoV-2 RNA is generally detectable in upper and lower respiratory specimens during the acute phase of infection. The lowest concentration of SARS-CoV-2 viral copies this assay can detect is 250 copies / mL. A negative result does not preclude SARS-CoV-2 infection and should not be used as the sole basis for treatment or other patient management decisions.  A negative result may occur with improper specimen collection / handling, submission of  specimen other than nasopharyngeal swab, presence of viral mutation(s) within the areas targeted by this assay, and inadequate number of viral copies (<250 copies / mL). A negative result must be combined with clinical observations, patient history, and epidemiological information. Fact Sheet for Patients:   BoilerBrush.com.cy Fact Sheet for Healthcare Providers: https://pope.com/ This test is not yet approved or cleared  by the [place] FDA and has been authorized for detection and/or diagnosis of SARS-CoV-2 by FDA under an Emergency Use Authorization (EUA).  This EUA will remain in effect (meaning this test can be used) for the duration of the COVID-19 declaration under Section 564(b)(1) of the Act, 21 U.S.C. section 360bbb-3(b)(1), unless the authorization is terminated or revoked sooner. Performed at [facility], [address], [place], [place] [address]   MRSA PCR Screening     Status: None   Collection Time: 12/23/19  4:20 AM   Specimen: Nasal Mucosa; Nasopharyngeal  Result Value Ref Range Status   MRSA by PCR NEGATIVE NEGATIVE Final    Comment:          The GeneXpert MRSA Assay (FDA approved for NASAL specimens only), is one component of a comprehensive MRSA colonization surveillance program. It is not intended to diagnose MRSA infection nor to guide or monitor treatment for MRSA infections. Performed at [facility], [address], [place], [place] [address]   Culture, blood (routine x 2)     Status: None (Preliminary result)   Collection Time: 12/23/19  5:06 AM   Specimen: Left Antecubital; Blood  Result Value Ref Range Status   Specimen Description   Final    LEFT ANTECUBITAL BOTTLES DRAWN AEROBIC AND ANAEROBIC   Special Requests Blood Culture adequate volume  Final   Culture   Final    NO GROWTH 1 DAY Performed at [facility], [address], [place], [place] [address]    Report Status PENDING  Incomplete  Culture, blood (routine x 2)     Status: None (Preliminary result)   Collection Time: 12/23/19  6:03 AM   Specimen: Left Antecubital; Blood  Result Value Ref Range Status   Specimen Description   Final    LEFT ANTECUBITAL BOTTLES DRAWN AEROBIC AND ANAEROBIC   Special Requests Blood Culture adequate volume  Final   Culture   Final    NO GROWTH 1 DAY Performed at [facility], [address], [place], [place] [address]    Report Status PENDING  Incomplete    Radiology Reports DG Chest 1 View  Result Date: 12/22/2019 CLINICAL DATA:  Headaches and dizziness EXAM: CHEST  1 VIEW COMPARISON:  10/24/2019 FINDINGS: The heart size and mediastinal contours are within normal limits. Both lungs are clear. The visualized skeletal structures are unremarkable. IMPRESSION: No active disease. Electronically Signed   By: Alcide Clever M.D.   On: 12/22/2019 15:43   CT HEAD WO CONTRAST  Result Date: 12/22/2019 CLINICAL DATA:  Headache, dizziness, altered level of consciousness EXAM: CT HEAD WITHOUT CONTRAST TECHNIQUE: Contiguous axial images were obtained from the base of the skull through the vertex without intravenous contrast.  COMPARISON:  12/17/2019, 06/07/2019 FINDINGS: Brain: Evaluation is limited due to patient motion during the exam. No evidence of acute infarct or hemorrhage. The lateral ventricles and midline structures are grossly unremarkable. There are no acute extra-axial fluid collections. No mass effect. Vascular: No hyperdense vessel or unexpected calcification. Skull: Normal. Negative for fracture or focal lesion. Sinuses/Orbits: The destructive process centered in the left middle ear seen on prior  CT head and temporal bone examinations is unchanged. Chronic opacification of the left mastoid air cells. The remainder of the paranasal sinuses and right mastoid air cells are unremarkable. Other: None. IMPRESSION: 1. Limited study due to patient motion. 2. No acute infarct or hemorrhage. 3. Stable destructive process centered in the left middle ear, compatible with cholesteatoma reported on prior temporal bone CT 06/07/2019. Electronically Signed   By: Randa Ngo M.D.   On: 12/22/2019 15:41   CT Head Wo Contrast  Result Date: 12/17/2019 CLINICAL DATA:  Ear pain and headache for several days, acutely worsening EXAM: CT HEAD WITHOUT CONTRAST TECHNIQUE: Contiguous axial images were obtained from the base of the skull through the vertex without intravenous contrast. COMPARISON:  Temporal bone CT 11 5 2020 FINDINGS: Brain: No evidence of acute infarction, hemorrhage, hydrocephalus, extra-axial collection or mass lesion/mass effect. Vascular: Atherosclerotic calcification of the carotid siphons. No hyperdense vessel. Skull: No calvarial fracture or suspicious osseous lesion. No scalp swelling or hematoma. Sinuses/Orbits: Extensive opacity seen throughout the left middle ear cavity with complete opacification of the left mastoid air cells and some hyperostotic changes favoring chronicity, with similar findings to the comparison CT temporal bone 06/08/2019. There is opacification of much of the attic of the middle ear cavity  with destructive changes of the ossicular chain as well as progressive destructive changes along the left semi circular canals including a questionable focus of dehiscence along sigmoid plate. There is chronic dehiscence of the tympanic plate anteriorly along the posterior fossa the temporomandibular joint. The tegmen tympani appears grossly intact on these non tailored images. The right mastoid air cells and paranasal sinuses are predominantly clear. Included orbital structures are unremarkable. Other: None IMPRESSION: 1. No acute intracranial abnormality. 2. Extensive opacity throughout the left middle ear cavity compatible with patient's known cholesteatoma. 3. Progressive destructive changes along the left ossicular chain. 4. Progressive destructive changes along the left semi circular canals including a questionable focus of dehiscence along the sigmoid plate. 5. Chronic dehiscence of the tympanic plate anteriorly along the posterior fossa the temporomandibular joint. 6. Consider dedicated temporal bone CT for better interrogation of the sigmoid dehiscence. This study may be reconstructed from these images. Electronically Signed   By: Lovena Le M.D.   On: 12/17/2019 22:10     CBC Recent Labs  Lab 12/17/19 2115 12/22/19 1550 12/23/19 0020  WBC 8.1 11.3* 13.8*  HGB 12.4* 12.9* 11.8*  HCT 37.0* 37.3* 33.8*  PLT 289 329 329  MCV 82.4 79.5* 78.4*  MCH 27.6 27.5 27.4  MCHC 33.5 34.6 34.9  RDW 14.6 14.4 14.1  LYMPHSABS 1.9 1.0  --   MONOABS 0.6 0.6  --   EOSABS 0.1 0.0  --   BASOSABS 0.0 0.0  --     Chemistries  Recent Labs  Lab 12/22/19 1550 12/22/19 1550 12/22/19 1843 12/23/19 0020 12/23/19 0508 12/23/19 1642 12/23/19 2305 12/24/19 0946 12/24/19 1535  NA 120*   < > 119*   < > 119* 119* 119* 120* 119*  K 4.2   < >  --   --  4.0 4.1 4.0 4.0 4.0  CL 86*   < >  --   --  87* 87* 90* 91* 91*  CO2 22   < >  --   --  21* 21* 19* 21* 19*  GLUCOSE 149*   < >  --   --  172* 142* 145*  134* 125*  BUN 13   < >  --   --  12 12 11 11 11   CREATININE 1.00   < >  --   --  0.80 0.75 0.67 0.74 0.74  CALCIUM 9.1   < >  --   --  8.9 8.8* 8.5* 8.5* 8.2*  MG  --   --  1.6*  --   --   --   --   --   --   AST 15  --   --   --   --   --   --   --   --   ALT 10  --   --   --   --   --   --   --   --   ALKPHOS 58  --   --   --   --   --   --   --   --   BILITOT 0.8  --   --   --   --   --   --   --   --    < > = values in this interval not displayed.   ------------------------------------------------------------------------------------------------------------------ No results for input(s): CHOL, HDL, LDLCALC, TRIG, CHOLHDL, LDLDIRECT in the last 72 hours.  Lab Results  Component Value Date   HGBA1C 6.3 (H) 10/25/2019   ------------------------------------------------------------------------------------------------------------------ No results for input(s): TSH, T4TOTAL, T3FREE, THYROIDAB in the last 72 hours.  Invalid input(s): FREET3 ------------------------------------------------------------------------------------------------------------------ No results for input(s): VITAMINB12, FOLATE, FERRITIN, TIBC, IRON, RETICCTPCT in the last 72 hours.  Coagulation profile Recent Labs  Lab 12/22/19 1550  INR 1.1    No results for input(s): DDIMER in the last 72 hours.  Cardiac Enzymes No results for input(s): CKMB, TROPONINI, MYOGLOBIN in the last 168 hours.  Invalid input(s): CK ------------------------------------------------------------------------------------------------------------------ No results found for: BNP   12/24/19 M.D on 12/24/2019 at 8:12 PM  Go to [email or web address] - for contact info  [facility] - Office  [phone]

## 2019-12-24 NOTE — H&P (Signed)
HPI:   Michael Rivas is a 64 y.o. male who presents as a consult Patient.   Referring Provider: Stephens Shire II*  Chief complaint: Left ear problems.  HPI: For years he has had problems with pain and aching in the left ear. More recently he is having drainage from the ear including some bloody drainage. He was in the emergency department recently, over the weekend, recommendation was made for eardrops which she was unable to fill. He is a smoker. He does use Q-tips. Otherwise in good health. He does complain of hearing loss on that left side.  PMH/Meds/All/SocHx/FamHx/ROS:   Past Medical History:  Diagnosis Date  . Hypertension   Past Surgical History:  Procedure Laterality Date  . BACK SURGERY  . CHOLECYSTECTOMY   No family history of bleeding disorders, wound healing problems or difficulty with anesthesia.   Social History   Socioeconomic History  . Marital status: Single  Spouse name: Not on file  . Number of children: Not on file  . Years of education: Not on file  . Highest education level: Not on file  Occupational History  . Not on file  Social Needs  . Financial resource strain: Not on file  . Food insecurity  Worry: Not on file  Inability: Not on file  . Transportation needs  Medical: Not on file  Non-medical: Not on file  Tobacco Use  . Smoking status: Current Every Day Smoker  Packs/day: 0.50  . Smokeless tobacco: Never Used  Substance and Sexual Activity  . Alcohol use: Yes  . Drug use: Never  . Sexual activity: Not on file  Lifestyle  . Physical activity  Days per week: Not on file  Minutes per session: Not on file  . Stress: Not on file  Relationships  . Social Multimedia programmer on phone: Not on file  Gets together: Not on file  Attends religious service: Not on file  Active member of club or organization: Not on file  Attends meetings of clubs or organizations: Not on file  Relationship status: Not on file  Other Topics Concern   . Not on file  Social History Narrative  . Not on file   Current Outpatient Medications:  . baclofen (LIORESAL) 20 MG tablet, Take 20 mg by mouth., Disp: , Rfl:   A complete ROS was performed with pertinent positives/negatives noted in the HPI. The remainder of the ROS are negative.   Physical Exam:   BP 180/85  Pulse 75  Temp 97.3 F (36.3 C)  Ht 1.829 m (6')  Wt 72.6 kg (160 lb)  BMI 21.70 kg/m   General: Healthy and alert, in no distress, breathing easily. Normal affect. In a pleasant mood. Head: Normocephalic, atraumatic. No masses, or scars. Eyes: Pupils are equal, and reactive to light. Vision is grossly intact. No spontaneous or gaze nystagmus. Ears: Right ear canal, tympanic membrane and middle ear look normal to inspection. Left ear canal with mild skin edema, purulent exudate and thickening/erythema of the drum. Hearing: Tuning fork lateralizes to the left. Nose: Nasal cavities are clear with healthy mucosa, no polyps or exudate. Airways are patent. Face: No masses or scars, facial nerve function is symmetric. Oral Cavity: No mucosal abnormalities are noted. Tongue with normal mobility. Dentition appears healthy. Oropharynx: Tonsils are symmetric. There are no mucosal masses identified. Tongue base appears normal and healthy. Larynx/Hypopharynx: deferred Chest: Deferred Neck: No palpable masses, no cervical adenopathy, no thyroid nodules or enlargement. Neuro: Cranial nerves II-XII with normal  function. Balance: Normal gate. Other findings: none.  Independent Review of Additional Tests or Records:  none  Procedures:  Procedure note:  Indications: External otitis  Details of ear canal cleaning were discussed with the patient and all questions were answered.  Procedure:  Using the operating microscope, the left side was cleaned of mucopurulent exudate and debris using suction.   He tolerated this procedure well. There were no complications.  The  tympanic membrane is bulging and there appears to be a posterior perforation with bony debris inside, very concerning for cholesteatoma.  Impression & Plans:  Chronic draining ear with evidence of conductive hearing loss and probable cholesteatoma. Recommend strict water precautions. Recommend he try to stop smoking. Keep Q-tips out of his ears. Continue on antibiotic drops. I will schedule him for CT temporal bones to further evaluate the anatomy. We will need a hearing test at some point and then we will discuss possible surgical intervention.

## 2019-12-25 ENCOUNTER — Inpatient Hospital Stay (HOSPITAL_COMMUNITY): Payer: PPO

## 2019-12-25 LAB — BASIC METABOLIC PANEL
Anion gap: 10 (ref 5–15)
Anion gap: 12 (ref 5–15)
Anion gap: 12 (ref 5–15)
Anion gap: 13 (ref 5–15)
BUN: 14 mg/dL (ref 8–23)
BUN: 16 mg/dL (ref 8–23)
BUN: 17 mg/dL (ref 8–23)
BUN: 18 mg/dL (ref 8–23)
CO2: 19 mmol/L — ABNORMAL LOW (ref 22–32)
CO2: 20 mmol/L — ABNORMAL LOW (ref 22–32)
CO2: 20 mmol/L — ABNORMAL LOW (ref 22–32)
CO2: 22 mmol/L (ref 22–32)
Calcium: 8.1 mg/dL — ABNORMAL LOW (ref 8.9–10.3)
Calcium: 8.4 mg/dL — ABNORMAL LOW (ref 8.9–10.3)
Calcium: 8.6 mg/dL — ABNORMAL LOW (ref 8.9–10.3)
Calcium: 8.8 mg/dL — ABNORMAL LOW (ref 8.9–10.3)
Chloride: 90 mmol/L — ABNORMAL LOW (ref 98–111)
Chloride: 90 mmol/L — ABNORMAL LOW (ref 98–111)
Chloride: 90 mmol/L — ABNORMAL LOW (ref 98–111)
Chloride: 91 mmol/L — ABNORMAL LOW (ref 98–111)
Creatinine, Ser: 0.85 mg/dL (ref 0.61–1.24)
Creatinine, Ser: 0.88 mg/dL (ref 0.61–1.24)
Creatinine, Ser: 0.88 mg/dL (ref 0.61–1.24)
Creatinine, Ser: 0.9 mg/dL (ref 0.61–1.24)
GFR calc Af Amer: >60 mL/min (ref >60)
GFR calc Af Amer: >60 mL/min (ref >60)
GFR calc Af Amer: >60 mL/min (ref >60)
GFR calc Af Amer: >60 mL/min (ref >60)
GFR calc non Af Amer: >60 mL/min (ref >60)
GFR calc non Af Amer: >60 mL/min (ref >60)
GFR calc non Af Amer: >60 mL/min (ref >60)
GFR calc non Af Amer: >60 mL/min (ref >60)
Glucose, Bld: 122 mg/dL — ABNORMAL HIGH (ref 70–99)
Glucose, Bld: 123 mg/dL — ABNORMAL HIGH (ref 70–99)
Glucose, Bld: 133 mg/dL — ABNORMAL HIGH (ref 70–99)
Glucose, Bld: 152 mg/dL — ABNORMAL HIGH (ref 70–99)
Potassium: 3.7 mmol/L (ref 3.5–5.1)
Potassium: 3.7 mmol/L (ref 3.5–5.1)
Potassium: 3.7 mmol/L (ref 3.5–5.1)
Potassium: 4.1 mmol/L (ref 3.5–5.1)
Sodium: 120 mmol/L — ABNORMAL LOW (ref 135–145)
Sodium: 121 mmol/L — ABNORMAL LOW (ref 135–145)
Sodium: 124 mmol/L — ABNORMAL LOW (ref 135–145)
Sodium: 124 mmol/L — ABNORMAL LOW (ref 135–145)

## 2019-12-25 LAB — GLUCOSE, CAPILLARY
Glucose-Capillary: 148 mg/dL — ABNORMAL HIGH (ref 70–99)
Glucose-Capillary: 114 mg/dL — ABNORMAL HIGH (ref 70–99)
Glucose-Capillary: 124 mg/dL — ABNORMAL HIGH (ref 70–99)
Glucose-Capillary: 113 mg/dL — ABNORMAL HIGH (ref 70–99)
Glucose-Capillary: 145 mg/dL — ABNORMAL HIGH (ref 70–99)

## 2019-12-25 LAB — URINALYSIS, ROUTINE W REFLEX MICROSCOPIC
Bacteria, UA: NONE SEEN
Bilirubin Urine: NEGATIVE
Glucose, UA: NEGATIVE mg/dL
Ketones, ur: NEGATIVE mg/dL
Leukocytes,Ua: NEGATIVE
Nitrite: NEGATIVE
Protein, ur: NEGATIVE mg/dL
Specific Gravity, Urine: 1.012 (ref 1.005–1.030)
pH: 5 (ref 5.0–8.0)

## 2019-12-25 MED ORDER — LORAZEPAM 2 MG/ML IJ SOLN
1.0000 mg | INTRAMUSCULAR | Status: DC | PRN
  Administered 2019-12-25 – 2019-12-26 (×2): 1 mg via INTRAVENOUS
  Filled 2019-12-25: qty 1

## 2019-12-25 MED ORDER — LORAZEPAM 1 MG PO TABS: 1.0000 mg | ORAL_TABLET | ORAL | Status: DC | PRN

## 2019-12-25 MED ORDER — POTASSIUM CHLORIDE 10 MEQ/100ML IV SOLN
10.0000 meq | INTRAVENOUS | Status: AC
  Administered 2019-12-25 (×4): 10 meq via INTRAVENOUS
  Filled 2019-12-25 (×4): qty 100

## 2019-12-25 MED ORDER — SODIUM CHLORIDE 1 G PO TABS
1.0000 g | ORAL_TABLET | Freq: Two times a day (BID) | ORAL | Status: AC
  Administered 2019-12-27: 1 g via ORAL
  Filled 2019-12-25 (×2): qty 1

## 2019-12-25 MED ORDER — FUROSEMIDE 10 MG/ML IJ SOLN
20.0000 mg | Freq: Two times a day (BID) | INTRAMUSCULAR | Status: DC
  Administered 2019-12-26 – 2019-12-27 (×3): 20 mg via INTRAVENOUS
  Filled 2019-12-25 (×3): qty 2

## 2019-12-25 NOTE — Progress Notes (Signed)
Called and spoke to Dr. Lucky Rathke office to make aware that cardiac clearance and medical clearance will need to be obtained before proceeding with surgery. Patient is currently admitted to hospital and recent NSTEMI in March 2021.

## 2019-12-25 NOTE — Progress Notes (Addendum)
Patient Demographics:    Michael Rivas, is a 64 y.o. male, DOB - 10-29-1955, YNW:[id number]  Admit date - 12/22/2019   Admitting Physician No admitting provider for patient encounter.  Outpatient Primary MD for the patient is [name], MD  LOS - 3   Chief Complaint  Patient presents with  . Altered Mental Status        Subjective:    Michael Rivas today has   no emesis,  No chest pain,   --Girlfriend at bedside---  -More awake, able to tolerate oral liquid intake -Low-grade temp noted   Assessment  & Plan :    Principal Problem:   Acute hyponatremia Active Problems:   Essential hypertension, benign   History of non-ST elevation myocardial infarction (NSTEMI)   Alcohol dependence (HCC)   Coronary artery disease of native artery of native heart with stable angina pectoris (HCC)   Prediabetes   Cholesteatoma  Brief Summary -64 y.o. male with a history of CAD s/p recent NSTEMI, (10/2019), HTN, glucose intolerance, alcohol and tobacco abuse admitted on 12/22/2019 with hyponatremia with sodium around 120   A/p 1)Persistent Hyponatremia--- SIADH Versus beer Potomania--informal over the latter as patient's the week preceding admission patient's beer intake was very minimal--- and [name]  -serial sodium noted to be in the range of 119 (patient sodium usually runs between 127 and 130) -Hydrated aggressively and hyponatremia persist -Urine osmolarity is 813 -Urine sodium is 140 and TSH is 2.7 -Discussed with Dr. Kathrene Bongo and Dr Ronalee Belts ( nephrologist)  -Sodium is up to 124- -okay to change IV Lasix to 20 mg twice daily starting 12/26/2019 -Okay to give sodium chloride tablets 1 g twice daily  2)Acute metabolic encephalopathy--suspect due to #1 above -Patient is at risk for alcohol withdrawal as well -CT head  without acute findings, demonstrates left middle ear cholesteatoma  3)Glucose intolerance--- A1c was 6.3 on 10/25/2019 - allow some permissive hyperglycemia due to altered mentation with no oral intake at this time  -use Novolog/Humalog Sliding scale insulin with Accu-Cheks/Fingersticks as ordered   4)Febrile illness--WBC 13.8, blood cultures NGTD chest x-ray unremarkable , UA requested -Chest x-ray without definite pneumonia -Empiric iv Rocephin pending urine culture  5)H/o CAD--s/p NSTEMI 10/2019-- s/p LHC: "Partially successful angioplasty LCx. Residual calcific 50-60% stenosis, which will need atherectomy at a staged procedure. Also has severe RCA disease. Continue medical management   with Aspirin, metoprolol and Lipitor -Give IV metoprolol until mentation improves enough to take oral intake  6)Etoh Abuse--- heavy alcohol use, high risk for DTs, patient received IV banana bag, continue lorazepam per CIWA protocol, continue multivitamin  7)HTN-2 uncontrolled, IV metoprolol scheduled for CAD as above #5 -- clonidine patch 0.1 mg for better BP control until the patient is awake enough to tolerate oral intake safely -May use IV labetalol as needed elevated BP, watch for bradycardia  8)Left side middle ear and mastoid cholesteatoma - Followed by ENT (Dr. Serena Colonel) who recommended tympanomastoidectomy back in 06/21/19; he has been unable to complete this surgery d/t sevral cancellations d/t low sodium levels  Disposition/Need for in-Hospital Stay- patient unable to be discharged at this time due to -persistent and symptomatic  hyponatremia requiring IV Lasix -Patient From: home D/C Place: home Barriers: Not Clinically Stable-   Code Status : full  Family Communication:   Discussed with his live-in girlfriend [name] at bedside  Consults  :  --Curbside consultation with nephrology service  DVT Prophylaxis  :  Lovenox -  - SCDs  Lab Results  Component Value Date   PLT 329  12/23/2019    Inpatient Medications  Scheduled Meds: . aspirin EC  81 mg Oral Daily  . atorvastatin  80 mg Oral q1800  . chlorhexidine  15 mL Mouth Rinse BID  . Chlorhexidine Gluconate Cloth  6 each Topical Daily  . cloNIDine  0.1 mg Transdermal Weekly  . enoxaparin (LOVENOX) injection  40 mg Subcutaneous QHS  . folic acid  1 mg Oral Daily  . [START ON 12/26/2019] furosemide  20 mg Intravenous BID  . insulin aspart  0-5 Units Subcutaneous QID  . insulin aspart  0-5 Units Subcutaneous QHS  . mouth rinse  15 mL Mouth Rinse q12n4p  . megestrol  40 mg Oral BID  . metoprolol tartrate  5 mg Intravenous Q6H  . multivitamin with minerals  1 tablet Oral Daily  . pantoprazole  40 mg Oral Daily  . thiamine  100 mg Oral Daily   Or  . thiamine  100 mg Intravenous Daily  . ticagrelor  90 mg Oral BID   Continuous Infusions: . piperacillin-tazobactam (ZOSYN)  IV 3.375 g (12/25/19 1404)   PRN Meds:.acetaminophen, HYDROcodone-acetaminophen, labetalol, LORazepam **OR** LORazepam, ondansetron **OR** ondansetron (ZOFRAN) IV, ondansetron    Anti-infectives (From admission, onward)   Start     Dose/Rate Route Frequency Ordered Stop   12/23/19 0400  piperacillin-tazobactam (ZOSYN) IVPB 3.375 g     3.375 g 12.5 mL/hr over 240 Minutes Intravenous Every 8 hours 12/23/19 0325          Objective:   Vitals:   12/25/19 1500 12/25/19 1530 12/25/19 1600 12/25/19 1832  BP: (!) 151/91   (!) 163/67  Pulse: 90  87 87  Resp:  20  (!) 21  Temp:      TempSrc:      SpO2: 96% 97%  98%  Weight:      Height:        Wt Readings from Last 3 Encounters:  12/25/19 62.1 kg  12/17/19 68 kg  11/07/19 68.9 kg     Intake/Output Summary (Last 24 hours) at 12/25/2019 1924 Last data filed at 12/25/2019 1753 Gross per 24 hour  Intake 521.79 ml  Output 1650 ml  Net -1128.21 ml     Physical Exam  Gen:-More awake, less lethargic HEENT:- [place].AT, No sclera icterus Neck-Supple Neck,No JVD,.    Lungs-diminished in bases, no wheezing CV- S1, S2 normal, regular  Abd-  +ve B.Sounds, Abd Soft, No tenderness,    Extremity/Skin:- No  edema, pedal pulses present  Psych-less sleepy, Neuro-Limited exam as patient is waking up but not fully awake yet, no new focal deficits,    Data Review:   Micro Results Recent Results (from the past 240 hour(s))  SARS Coronavirus 2 by RT PCR (hospital order, performed in [facility] hospital lab) Nasopharyngeal Nasopharyngeal Swab     Status: None   Collection Time: 12/22/19  3:37 PM   Specimen: Nasopharyngeal Swab  Result Value Ref Range Status   SARS Coronavirus 2 NEGATIVE NEGATIVE Final    Comment: (NOTE) SARS-CoV-2 target nucleic acids are NOT DETECTED. The SARS-CoV-2 RNA is generally detectable in upper and lower  respiratory specimens during the acute phase of infection. The lowest concentration of SARS-CoV-2 viral copies this assay can detect is 250 copies / mL. A negative result does not preclude SARS-CoV-2 infection and should not be used as the sole basis for treatment or other patient management decisions.  A negative result may occur with improper specimen collection / handling, submission of specimen other than nasopharyngeal swab, presence of viral mutation(s) within the areas targeted by this assay, and inadequate number of viral copies (<250 copies / mL). A negative result must be combined with clinical observations, patient history, and epidemiological information. Fact Sheet for Patients:   BoilerBrush.com.cy Fact Sheet for Healthcare Providers: https://pope.com/ This test is not yet approved or cleared  by the [place] FDA and has been authorized for detection and/or diagnosis of SARS-CoV-2 by FDA under an Emergency Use Authorization (EUA).  This EUA will remain in effect (meaning this test can be used) for the duration of the COVID-19 declaration under Section 564(b)(1) of  the Act, 21 U.S.C. section 360bbb-3(b)(1), unless the authorization is terminated or revoked sooner. Performed at [facility], [address] [place], [place] [address]   MRSA PCR Screening     Status: None   Collection Time: 12/23/19  4:20 AM   Specimen: Nasal Mucosa; Nasopharyngeal  Result Value Ref Range Status   MRSA by PCR NEGATIVE NEGATIVE Final    Comment:        The GeneXpert MRSA Assay (FDA approved for NASAL specimens only), is one component of a comprehensive MRSA colonization surveillance program. It is not intended to diagnose MRSA infection nor to guide or monitor treatment for MRSA infections. Performed at [facility], [address], [place], [place] [address]   Culture, blood (routine x 2)     Status: None (Preliminary result)   Collection Time: 12/23/19  5:06 AM   Specimen: Left Antecubital; Blood  Result Value Ref Range Status   Specimen Description   Final    LEFT ANTECUBITAL BOTTLES DRAWN AEROBIC AND ANAEROBIC   Special Requests Blood Culture adequate volume  Final   Culture   Final    NO GROWTH 2 DAYS Performed at [facility], [address] [place], [place] [address]    Report Status PENDING  Incomplete  Culture, blood (routine x 2)     Status: None (Preliminary result)   Collection Time: 12/23/19  6:03 AM   Specimen: Left Antecubital; Blood  Result Value Ref Range Status   Specimen Description   Final    LEFT ANTECUBITAL BOTTLES DRAWN AEROBIC AND ANAEROBIC   Special Requests Blood Culture adequate volume  Final   Culture   Final    NO GROWTH 2 DAYS Performed at [facility], [address] [place], [place] [address]    Report Status PENDING  Incomplete    Radiology Reports DG Chest 1 View  Result Date: 12/22/2019 CLINICAL DATA:  Headaches and dizziness EXAM: CHEST  1 VIEW COMPARISON:  10/24/2019 FINDINGS: The heart size and mediastinal contours are within normal limits. Both lungs are clear. The visualized skeletal structures are  unremarkable. IMPRESSION: No active disease. Electronically Signed   By: Alcide Clever M.D.   On: 12/22/2019 15:43   CT HEAD WO CONTRAST  Result Date: 12/22/2019 CLINICAL DATA:  Headache, dizziness, altered level of consciousness EXAM: CT HEAD WITHOUT CONTRAST TECHNIQUE: Contiguous axial images were obtained from the base of the skull through the vertex without intravenous contrast. COMPARISON:  12/17/2019, 06/07/2019 FINDINGS: Brain: Evaluation is limited  due to patient motion during the exam. No evidence of acute infarct or hemorrhage. The lateral ventricles and midline structures are grossly unremarkable. There are no acute extra-axial fluid collections. No mass effect. Vascular: No hyperdense vessel or unexpected calcification. Skull: Normal. Negative for fracture or focal lesion. Sinuses/Orbits: The destructive process centered in the left middle ear seen on prior CT head and temporal bone examinations is unchanged. Chronic opacification of the left mastoid air cells. The remainder of the paranasal sinuses and right mastoid air cells are unremarkable. Other: None. IMPRESSION: 1. Limited study due to patient motion. 2. No acute infarct or hemorrhage. 3. Stable destructive process centered in the left middle ear, compatible with cholesteatoma reported on prior temporal bone CT 06/07/2019. Electronically Signed   By: Sharlet Salina M.D.   On: 12/22/2019 15:41   CT Head Wo Contrast  Result Date: 12/17/2019 CLINICAL DATA:  Ear pain and headache for several days, acutely worsening EXAM: CT HEAD WITHOUT CONTRAST TECHNIQUE: Contiguous axial images were obtained from the base of the skull through the vertex without intravenous contrast. COMPARISON:  Temporal bone CT 11 5 2020 FINDINGS: Brain: No evidence of acute infarction, hemorrhage, hydrocephalus, extra-axial collection or mass lesion/mass effect. Vascular: Atherosclerotic calcification of the carotid siphons. No hyperdense vessel. Skull: No calvarial  fracture or suspicious osseous lesion. No scalp swelling or hematoma. Sinuses/Orbits: Extensive opacity seen throughout the left middle ear cavity with complete opacification of the left mastoid air cells and some hyperostotic changes favoring chronicity, with similar findings to the comparison CT temporal bone 06/08/2019. There is opacification of much of the attic of the middle ear cavity with destructive changes of the ossicular chain as well as progressive destructive changes along the left semi circular canals including a questionable focus of dehiscence along sigmoid plate. There is chronic dehiscence of the tympanic plate anteriorly along the posterior fossa the temporomandibular joint. The tegmen tympani appears grossly intact on these non tailored images. The right mastoid air cells and paranasal sinuses are predominantly clear. Included orbital structures are unremarkable. Other: None IMPRESSION: 1. No acute intracranial abnormality. 2. Extensive opacity throughout the left middle ear cavity compatible with patient's known cholesteatoma. 3. Progressive destructive changes along the left ossicular chain. 4. Progressive destructive changes along the left semi circular canals including a questionable focus of dehiscence along the sigmoid plate. 5. Chronic dehiscence of the tympanic plate anteriorly along the posterior fossa the temporomandibular joint. 6. Consider dedicated temporal bone CT for better interrogation of the sigmoid dehiscence. This study may be reconstructed from these images. Electronically Signed   By: Kreg Shropshire M.D.   On: 12/17/2019 22:10   DG CHEST PORT 1 VIEW  Result Date: 12/25/2019 CLINICAL DATA:  Fevers EXAM: PORTABLE CHEST 1 VIEW COMPARISON:  12/22/2019 FINDINGS: Cardiac shadow is stable. Mild aortic calcifications seen. The lungs are well aerated bilaterally. Minimal pleural fluid on the left is seen as well as new increased retrocardiac density likely representing atelectasis.  No bony abnormality is noted. IMPRESSION: New left basilar atelectasis in the retrocardiac region with minimal pleural fluid. Electronically Signed   By: Alcide Clever M.D.   On: 12/25/2019 09:57     CBC Recent Labs  Lab 12/22/19 1550 12/23/19 0020  WBC 11.3* 13.8*  HGB 12.9* 11.8*  HCT 37.3* 33.8*  PLT 329 329  MCV 79.5* 78.4*  MCH 27.5 27.4  MCHC 34.6 34.9  RDW 14.4 14.1  LYMPHSABS 1.0  --   MONOABS 0.6  --   EOSABS  0.0  --   BASOSABS 0.0  --     Chemistries  Recent Labs  Lab 12/22/19 1550 12/22/19 1550 12/22/19 1843 12/23/19 0020 12/24/19 0946 12/24/19 1535 12/24/19 2353 12/25/19 0748 12/25/19 1614  NA 120*   < > 119*   < > 120* 119* 120* 124* 124*  K 4.2  --   --    < > 4.0 4.0 3.7 3.7 4.1  CL 86*  --   --    < > 91* 91* 90* 90* 91*  CO2 22  --   --    < > 21* 19* 20* 22 20*  GLUCOSE 149*  --   --    < > 134* 125* 133* 122* 123*  BUN 13  --   --    < > 11 11 14 16 17   CREATININE 1.00  --   --    < > 0.74 0.74 0.88 0.85 0.88  CALCIUM 9.1  --   --    < > 8.5* 8.2* 8.1* 8.6* 8.8*  MG  --   --  1.6*  --   --   --   --   --   --   AST 15  --   --   --   --   --   --   --   --   ALT 10  --   --   --   --   --   --   --   --   ALKPHOS 58  --   --   --   --   --   --   --   --   BILITOT 0.8  --   --   --   --   --   --   --   --    < > = values in this interval not displayed.   ------------------------------------------------------------------------------------------------------------------ No results for input(s): CHOL, HDL, LDLCALC, TRIG, CHOLHDL, LDLDIRECT in the last 72 hours.  Lab Results  Component Value Date   HGBA1C 6.3 (H) 10/25/2019   ------------------------------------------------------------------------------------------------------------------ No results for input(s): TSH, T4TOTAL, T3FREE, THYROIDAB in the last 72 hours.  Invalid input(s):  FREET3 ------------------------------------------------------------------------------------------------------------------ No results for input(s): VITAMINB12, FOLATE, FERRITIN, TIBC, IRON, RETICCTPCT in the last 72 hours.  Coagulation profile Recent Labs  Lab 12/22/19 1550  INR 1.1    No results for input(s): DDIMER in the last 72 hours.  Cardiac Enzymes No results for input(s): CKMB, TROPONINI, MYOGLOBIN in the last 168 hours.  Invalid input(s): CK ------------------------------------------------------------------------------------------------------------------ No results found for: BNP   Roxan Hockey M.D on 12/25/2019 at 7:24 PM  Go to [email or web address] - for contact info  [facility] - Office  [phone]

## 2019-12-25 NOTE — Progress Notes (Signed)
Patient more alert today, responding with eyes open to verbal stimuli. Gave patient sips of water to which patient tolerated well, no coughing or clearing of throat noted. Dr Marisa Severin aware. Ok to give PO meds per Dr Marisa Severin and given one by one and patient tolerated well, Dr. Marisa Severin aware.

## 2019-12-26 ENCOUNTER — Inpatient Hospital Stay (HOSPITAL_COMMUNITY): Payer: PPO

## 2019-12-26 DIAGNOSIS — H7192 Unspecified cholesteatoma, left ear: Secondary | ICD-10-CM

## 2019-12-26 DIAGNOSIS — I10 Essential (primary) hypertension: Secondary | ICD-10-CM

## 2019-12-26 DIAGNOSIS — E871 Hypo-osmolality and hyponatremia: Principal | ICD-10-CM

## 2019-12-26 DIAGNOSIS — F1029 Alcohol dependence with unspecified alcohol-induced disorder: Secondary | ICD-10-CM

## 2019-12-26 LAB — BASIC METABOLIC PANEL
Anion gap: 10 (ref 5–15)
Anion gap: 11 (ref 5–15)
Anion gap: 11 (ref 5–15)
Anion gap: 12 (ref 5–15)
BUN: 20 mg/dL (ref 8–23)
BUN: 20 mg/dL (ref 8–23)
BUN: 21 mg/dL (ref 8–23)
BUN: 22 mg/dL (ref 8–23)
CO2: 21 mmol/L — ABNORMAL LOW (ref 22–32)
CO2: 20 mmol/L — ABNORMAL LOW (ref 22–32)
CO2: 21 mmol/L — ABNORMAL LOW (ref 22–32)
CO2: 21 mmol/L — ABNORMAL LOW (ref 22–32)
Calcium: 8.4 mg/dL — ABNORMAL LOW (ref 8.9–10.3)
Calcium: 8.6 mg/dL — ABNORMAL LOW (ref 8.9–10.3)
Calcium: 8.6 mg/dL — ABNORMAL LOW (ref 8.9–10.3)
Calcium: 8.5 mg/dL — ABNORMAL LOW (ref 8.9–10.3)
Chloride: 92 mmol/L — ABNORMAL LOW (ref 98–111)
Chloride: 92 mmol/L — ABNORMAL LOW (ref 98–111)
Chloride: 93 mmol/L — ABNORMAL LOW (ref 98–111)
Chloride: 92 mmol/L — ABNORMAL LOW (ref 98–111)
Creatinine, Ser: 1.02 mg/dL (ref 0.61–1.24)
Creatinine, Ser: 0.92 mg/dL (ref 0.61–1.24)
Creatinine, Ser: 1.04 mg/dL (ref 0.61–1.24)
Creatinine, Ser: 0.99 mg/dL (ref 0.61–1.24)
GFR calc Af Amer: >60 mL/min (ref >60)
GFR calc Af Amer: >60 mL/min (ref >60)
GFR calc Af Amer: >60 mL/min (ref >60)
GFR calc Af Amer: >60 mL/min (ref >60)
GFR calc non Af Amer: >60 mL/min (ref >60)
GFR calc non Af Amer: >60 mL/min (ref >60)
GFR calc non Af Amer: >60 mL/min (ref >60)
GFR calc non Af Amer: >60 mL/min (ref >60)
Glucose, Bld: 126 mg/dL — ABNORMAL HIGH (ref 70–99)
Glucose, Bld: 122 mg/dL — ABNORMAL HIGH (ref 70–99)
Glucose, Bld: 131 mg/dL — ABNORMAL HIGH (ref 70–99)
Glucose, Bld: 116 mg/dL — ABNORMAL HIGH (ref 70–99)
Potassium: 3.9 mmol/L (ref 3.5–5.1)
Potassium: 3.9 mmol/L (ref 3.5–5.1)
Potassium: 3.8 mmol/L (ref 3.5–5.1)
Potassium: 3.7 mmol/L (ref 3.5–5.1)
Sodium: 123 mmol/L — ABNORMAL LOW (ref 135–145)
Sodium: 123 mmol/L — ABNORMAL LOW (ref 135–145)
Sodium: 125 mmol/L — ABNORMAL LOW (ref 135–145)
Sodium: 125 mmol/L — ABNORMAL LOW (ref 135–145)

## 2019-12-26 LAB — CBC
HCT: 31.2 % — ABNORMAL LOW (ref 39.0–52.0)
Hemoglobin: 10.7 g/dL — ABNORMAL LOW (ref 13.0–17.0)
MCH: 27 pg (ref 26.0–34.0)
MCHC: 34.3 g/dL (ref 30.0–36.0)
MCV: 78.8 fL — ABNORMAL LOW (ref 80.0–100.0)
Platelets: 416 10*3/uL — ABNORMAL HIGH (ref 150–400)
RBC: 3.96 MIL/uL — ABNORMAL LOW (ref 4.22–5.81)
RDW: 14.3 % (ref 11.5–15.5)
WBC: 15.8 10*3/uL — ABNORMAL HIGH (ref 4.0–10.5)
nRBC: 0 % (ref 0.0–0.2)

## 2019-12-26 LAB — BLOOD GAS, ARTERIAL
Acid-base deficit: 0.4 mmol/L (ref 0.0–2.0)
Bicarbonate: 24.5 mmol/L (ref 20.0–28.0)
FIO2: 21
O2 Saturation: 95.5 %
Patient temperature: 37
pCO2 arterial: 31.5 mmHg — ABNORMAL LOW (ref 32.0–48.0)
pH, Arterial: 7.473 — ABNORMAL HIGH (ref 7.350–7.450)
pO2, Arterial: 78.9 mmHg — ABNORMAL LOW (ref 83.0–108.0)

## 2019-12-26 LAB — GLUCOSE, CAPILLARY
Glucose-Capillary: 104 mg/dL — ABNORMAL HIGH (ref 70–99)
Glucose-Capillary: 111 mg/dL — ABNORMAL HIGH (ref 70–99)
Glucose-Capillary: 113 mg/dL — ABNORMAL HIGH (ref 70–99)
Glucose-Capillary: 121 mg/dL — ABNORMAL HIGH (ref 70–99)
Glucose-Capillary: 126 mg/dL — ABNORMAL HIGH (ref 70–99)
Glucose-Capillary: 139 mg/dL — ABNORMAL HIGH (ref 70–99)

## 2019-12-26 LAB — MAGNESIUM: Magnesium: 2.2 mg/dL (ref 1.7–2.4)

## 2019-12-26 LAB — CORTISOL: Cortisol, Plasma: 23.2 ug/dL

## 2019-12-26 MED ORDER — PANTOPRAZOLE SODIUM 40 MG IV SOLR
40.0000 mg | INTRAVENOUS | Status: DC
  Administered 2019-12-26 – 2019-12-27 (×2): 40 mg via INTRAVENOUS
  Filled 2019-12-26 (×2): qty 40

## 2019-12-26 MED ORDER — BACLOFEN 10 MG PO TABS
10.0000 mg | ORAL_TABLET | Freq: Once | ORAL | Status: AC
  Administered 2019-12-26: 10 mg via ORAL
  Filled 2019-12-26: qty 1

## 2019-12-26 MED ORDER — SODIUM CHLORIDE 0.9 % IV SOLN
1.0000 g | INTRAVENOUS | Status: DC
  Administered 2019-12-26 – 2019-12-27 (×2): 1 g via INTRAVENOUS
  Filled 2019-12-26 (×3): qty 10

## 2019-12-26 MED ORDER — SODIUM CHLORIDE 0.9 % IV SOLN: INTRAVENOUS | Status: DC

## 2019-12-26 NOTE — TOC Initial Note (Signed)
Transition of Care [facility]) - Initial/Assessment Note    Patient Details  Name: Michael Rivas MRN: [id number] Date of Birth: 1956/04/14  Transition [facility]) CM/SW Contact:    Leitha Bleak, RN Phone Number: 12/26/2019, 3:42 PM  Clinical Narrative:    Patient admitted for acute hyponatremia. Patient lives at home with his girl friend. TOC consulted for SA resources. Patient is agreeable and discussed list, he drives and can provide his own transportation. Resources list provided.                Expected Discharge Plan: Home/Self Care Barriers to Discharge: Continued Medical Work up   Patient Goals and CMS Choice     Expected Discharge Plan and Services Expected Discharge Plan: Home/Self Care     Prior Living Arrangements/Services   Lives with:: Significant Other            Activities of Daily Living Home Assistive Devices/Equipment: Cane (specify quad or straight) ADL Screening (condition at time of admission) Patient's cognitive ability adequate to safely complete daily activities?: Yes Is the patient deaf or have difficulty hearing?: No Does the patient have difficulty seeing, even when wearing glasses/contacts?: No Does the patient have difficulty concentrating, remembering, or making decisions?: Yes Patient able to express need for assistance with ADLs?: No Does the patient have difficulty dressing or bathing?: Yes Independently performs ADLs?: No Communication: Independent Dressing (OT): Needs assistance Is this a change from baseline?: Change from baseline, expected to last <3days Grooming: Needs assistance Is this a change from baseline?: Change from baseline, expected to last <3 days Feeding: Needs assistance Is this a change from baseline?: Change from baseline, expected to last <3 days Bathing: Needs assistance Is this a change from baseline?: Change from baseline, expected to last <3 days Toileting: Needs assistance Is this a change from baseline?:  Change from baseline, expected to last <3 days In/Out Bed: Needs assistance Is this a change from baseline?: Change from baseline, expected to last <3 days Walks in Home: Independent with device (comment) Does the patient have difficulty walking or climbing stairs?: Yes Weakness of Legs: Both Weakness of Arms/Hands: None  Permission Sought/Granted    Emotional Assessment     Affect (typically observed): Flat, Quiet   Alcohol / Substance Use: Alcohol Use Psych Involvement: No (comment)  Admission diagnosis:  Acute hyponatremia [E87.1] Hyponatremia [E87.1] AMS (altered mental status) [R41.82] Patient Active Problem List   Diagnosis Date Noted  . Acute hyponatremia 12/22/2019  . Cholesteatoma 12/22/2019  . Chest pain due to myocardial ischemia   . Coronary artery disease of native artery of native heart with stable angina pectoris (HCC)   . CAD S/P percutaneous coronary angioplasty   . Prediabetes   . History of non-ST elevation myocardial infarction (NSTEMI) 10/25/2019  . Hyponatremia 10/25/2019  . Alcohol dependence (HCC) 10/25/2019  . Pulmonary nodule 10/25/2019  . Elevated troponin   . Angina pectoris (HCC)   . Essential hypertension, benign 03/02/2016  . Tobacco dependence 03/02/2016  . Depression 03/02/2016  . Chronic pain 03/02/2016   PCP:  [name], MD Pharmacy:   [facility] [address] [place] - [address] [place] [address] [place] [address] [place] [address] Phone: [phone] Fax: [phone]

## 2019-12-26 NOTE — Progress Notes (Signed)
Patient Demographics:    Michael Rivas, is a 64 y.o. male, DOB - Mar 10, 1956, LZJ:[id number]  Admit date - 12/22/2019   Admitting Physician No admitting provider for patient encounter.  Outpatient Primary MD for the patient is [name], MD  LOS - 4   Chief Complaint  Patient presents with  . Altered Mental Status        Subjective:    Michael Rivas does not appear to be in distress. He received Ativan overnight for agitation.  He remains lethargic at this time.  Speech is difficult to comprehend.  Family notes that he is repeatedly having hiccups.   Assessment  & Plan :    Principal Problem:   Acute hyponatremia Active Problems:   Essential hypertension, benign   History of non-ST elevation myocardial infarction (NSTEMI)   Alcohol dependence (HCC)   Coronary artery disease of native artery of native heart with stable angina pectoris (HCC)   Prediabetes   Cholesteatoma  Brief Summary -64 y.o. male with a history of CAD s/p recent NSTEMI, (10/2019), HTN, glucose intolerance, alcohol and tobacco abuse admitted on 12/22/2019 with hyponatremia with sodium around 120   A/p 1)Persistent Hyponatremia--- SIADH Versus beer Potomania--informal over the latter as patient's the week preceding admission patient's beer intake was very minimal--- and [name]  -serial sodium noted to be in the range of 119 (patient sodium usually runs between 127 and 130) -Hydrated aggressively and hyponatremia persist -Urine osmolarity is 813 -Urine sodium is 140 and TSH is 2.7 -Discussed with Dr. Kathrene Bongo and Dr Ronalee Belts ( nephrologist)  -Sodium is up to 125 -okay to change IV Lasix to 20 mg twice daily starting 12/26/2019 -Okay to give sodium chloride tablets 1 g twice daily  2)Acute metabolic encephalopathy--suspect due to #1  above -will also check ABG, and MRI brain -avoid further sedatives like ativan.  3)Glucose intolerance--- A1c was 6.3 on 10/25/2019 - allow some permissive hyperglycemia due to altered mentation with no oral intake at this time  -use Novolog/Humalog Sliding scale insulin with Accu-Cheks/Fingersticks as ordered   4)Febrile illness--WBC 13.8, blood cultures NGTD chest x-ray unremarkable , UA does not show signs of infection -Chest x-ray without definite pneumonia ?atelectasis -Empiric iv Rocephin pending urine culture  5)H/o CAD--s/p NSTEMI 10/2019-- s/p LHC: "Partially successful angioplasty LCx. Residual calcific 50-60% stenosis, which will need atherectomy at a staged procedure. Also has severe RCA disease. Continue medical management   with Aspirin, metoprolol and Lipitor -Give IV metoprolol until mentation improves enough to take oral intake  6)Etoh Abuse--no signs of alcohol withdrawal. His girlfriend reports that last drink of alcohol almost 1 week prior to admission. Continue to monitor.  7)HTN-2 uncontrolled, IV metoprolol scheduled for CAD as above #5 -- clonidine patch 0.1 mg for better BP control until the patient is awake enough to tolerate oral intake safely -May use IV labetalol as needed elevated BP, watch for bradycardia  8)Left side middle ear and mastoid cholesteatoma - Followed by ENT (Dr. Serena Colonel) who recommended tympanomastoidectomy back in 06/21/19; he has been unable to complete this surgery d/t sevral cancellations d/t low sodium levels  Disposition/Need for in-Hospital Stay- patient unable to be  discharged at this time due to -persistent and symptomatic hyponatremia requiring IV Lasix -Patient From: home D/C Place: home Barriers: Not Clinically Stable-   Code Status : full  Family Communication:   Discussed with his live-in girlfriend [name] at bedside  Consults  :  --Curbside consultation with nephrology service  DVT Prophylaxis  :  Lovenox -  - SCDs   Lab Results  Component Value Date   PLT 416 (H) 12/26/2019    Inpatient Medications  Scheduled Meds: . aspirin EC  81 mg Oral Daily  . atorvastatin  80 mg Oral q1800  . chlorhexidine  15 mL Mouth Rinse BID  . Chlorhexidine Gluconate Cloth  6 each Topical Daily  . cloNIDine  0.1 mg Transdermal Weekly  . enoxaparin (LOVENOX) injection  40 mg Subcutaneous QHS  . folic acid  1 mg Oral Daily  . furosemide  20 mg Intravenous BID  . insulin aspart  0-5 Units Subcutaneous QID  . insulin aspart  0-5 Units Subcutaneous QHS  . mouth rinse  15 mL Mouth Rinse q12n4p  . megestrol  40 mg Oral BID  . metoprolol tartrate  5 mg Intravenous Q6H  . multivitamin with minerals  1 tablet Oral Daily  . pantoprazole (PROTONIX) IV  40 mg Intravenous Q24H  . sodium chloride  1 g Oral BID WC  . thiamine  100 mg Oral Daily   Or  . thiamine  100 mg Intravenous Daily  . ticagrelor  90 mg Oral BID   Continuous Infusions: . sodium chloride 50 mL/hr at 12/26/19 1617  . cefTRIAXone (ROCEPHIN)  IV 1 g (12/26/19 1440)   PRN Meds:.acetaminophen, HYDROcodone-acetaminophen, labetalol, ondansetron **OR** ondansetron (ZOFRAN) IV, ondansetron    Anti-infectives (From admission, onward)   Start     Dose/Rate Route Frequency Ordered Stop   12/26/19 1200  cefTRIAXone (ROCEPHIN) 1 g in sodium chloride 0.9 % 100 mL IVPB     1 g 200 mL/hr over 30 Minutes Intravenous Every 24 hours 12/26/19 0955     12/23/19 0400  piperacillin-tazobactam (ZOSYN) IVPB 3.375 g  Status:  Discontinued     3.375 g 12.5 mL/hr over 240 Minutes Intravenous Every 8 hours 12/23/19 0325 12/26/19 0955        Objective:   Vitals:   12/26/19 1500 12/26/19 1646 12/26/19 1700 12/26/19 1800  BP: 125/84  131/78 (!) 147/83  Pulse: 71 73 73 79  Resp: (!) 25 20 20 18   Temp:  99 F (37.2 C)    TempSrc:  Axillary    SpO2: 97% 97% 97% 98%  Weight:      Height:        Wt Readings from Last 3 Encounters:  12/26/19 62.1 kg  12/17/19 68 kg   11/07/19 68.9 kg     Intake/Output Summary (Last 24 hours) at 12/26/2019 1923 Last data filed at 12/26/2019 1813 Gross per 24 hour  Intake 155.92 ml  Output 1275 ml  Net -1119.08 ml     Physical Exam  General exam: lethargic Respiratory system: Clear to auscultation. Respiratory effort normal. Cardiovascular system:RRR. No murmurs, rubs, gallops. Gastrointestinal system: Abdomen is nondistended, soft and nontender. No organomegaly or masses felt. Normal bowel sounds heard. Central nervous system: speech is dysathric, pupils equal bilaterally, does not cooperate with exam Extremities: no edema bilaterally Skin: No rashes, lesions or ulcers Psychiatry: lethargic    Data Review:   Micro Results Recent Results (from the past 240 hour(s))  SARS Coronavirus 2 by RT PCR (hospital  order, performed in [facility] hospital lab) Nasopharyngeal Nasopharyngeal Swab     Status: None   Collection Time: 12/22/19  3:37 PM   Specimen: Nasopharyngeal Swab  Result Value Ref Range Status   SARS Coronavirus 2 NEGATIVE NEGATIVE Final    Comment: (NOTE) SARS-CoV-2 target nucleic acids are NOT DETECTED. The SARS-CoV-2 RNA is generally detectable in upper and lower respiratory specimens during the acute phase of infection. The lowest concentration of SARS-CoV-2 viral copies this assay can detect is 250 copies / mL. A negative result does not preclude SARS-CoV-2 infection and should not be used as the sole basis for treatment or other patient management decisions.  A negative result may occur with improper specimen collection / handling, submission of specimen other than nasopharyngeal swab, presence of viral mutation(s) within the areas targeted by this assay, and inadequate number of viral copies (<250 copies / mL). A negative result must be combined with clinical observations, patient history, and epidemiological information. Fact Sheet for Patients:    BoilerBrush.com.cy Fact Sheet for Healthcare Providers: https://pope.com/ This test is not yet approved or cleared  by the [place] FDA and has been authorized for detection and/or diagnosis of SARS-CoV-2 by FDA under an Emergency Use Authorization (EUA).  This EUA will remain in effect (meaning this test can be used) for the duration of the COVID-19 declaration under Section 564(b)(1) of the Act, 21 U.S.C. section 360bbb-3(b)(1), unless the authorization is terminated or revoked sooner. Performed at [facility], [address], [place], [place] [address]   MRSA PCR Screening     Status: None   Collection Time: 12/23/19  4:20 AM   Specimen: Nasal Mucosa; Nasopharyngeal  Result Value Ref Range Status   MRSA by PCR NEGATIVE NEGATIVE Final    Comment:        The GeneXpert MRSA Assay (FDA approved for NASAL specimens only), is one component of a comprehensive MRSA colonization surveillance program. It is not intended to diagnose MRSA infection nor to guide or monitor treatment for MRSA infections. Performed at [facility], [address], [place], [place] [address]   Culture, blood (routine x 2)     Status: None (Preliminary result)   Collection Time: 12/23/19  5:06 AM   Specimen: Left Antecubital; Blood  Result Value Ref Range Status   Specimen Description   Final    LEFT ANTECUBITAL BOTTLES DRAWN AEROBIC AND ANAEROBIC   Special Requests Blood Culture adequate volume  Final   Culture   Final    NO GROWTH 3 DAYS Performed at [facility], [address] [place], [place] [address]    Report Status PENDING  Incomplete  Culture, blood (routine x 2)     Status: None (Preliminary result)   Collection Time: 12/23/19  6:03 AM   Specimen: Left Antecubital; Blood  Result Value Ref Range Status   Specimen Description   Final    LEFT ANTECUBITAL BOTTLES DRAWN AEROBIC AND ANAEROBIC   Special Requests Blood Culture adequate volume   Final   Culture   Final    NO GROWTH 3 DAYS Performed at [facility], [address], [place], [place] [address]    Report Status PENDING  Incomplete    Radiology Reports DG Chest 1 View  Result Date: 12/22/2019 CLINICAL DATA:  Headaches and dizziness EXAM: CHEST  1 VIEW COMPARISON:  10/24/2019 FINDINGS: The heart size and mediastinal contours are within normal limits. Both lungs are clear. The visualized skeletal structures are unremarkable. IMPRESSION: No active disease.  Electronically Signed   By: Alcide Clever M.D.   On: 12/22/2019 15:43   CT HEAD WO CONTRAST  Result Date: 12/22/2019 CLINICAL DATA:  Headache, dizziness, altered level of consciousness EXAM: CT HEAD WITHOUT CONTRAST TECHNIQUE: Contiguous axial images were obtained from the base of the skull through the vertex without intravenous contrast. COMPARISON:  12/17/2019, 06/07/2019 FINDINGS: Brain: Evaluation is limited due to patient motion during the exam. No evidence of acute infarct or hemorrhage. The lateral ventricles and midline structures are grossly unremarkable. There are no acute extra-axial fluid collections. No mass effect. Vascular: No hyperdense vessel or unexpected calcification. Skull: Normal. Negative for fracture or focal lesion. Sinuses/Orbits: The destructive process centered in the left middle ear seen on prior CT head and temporal bone examinations is unchanged. Chronic opacification of the left mastoid air cells. The remainder of the paranasal sinuses and right mastoid air cells are unremarkable. Other: None. IMPRESSION: 1. Limited study due to patient motion. 2. No acute infarct or hemorrhage. 3. Stable destructive process centered in the left middle ear, compatible with cholesteatoma reported on prior temporal bone CT 06/07/2019. Electronically Signed   By: Sharlet Salina M.D.   On: 12/22/2019 15:41   CT Head Wo Contrast  Result Date: 12/17/2019 CLINICAL DATA:  Ear pain and headache for several days, acutely  worsening EXAM: CT HEAD WITHOUT CONTRAST TECHNIQUE: Contiguous axial images were obtained from the base of the skull through the vertex without intravenous contrast. COMPARISON:  Temporal bone CT 11 5 2020 FINDINGS: Brain: No evidence of acute infarction, hemorrhage, hydrocephalus, extra-axial collection or mass lesion/mass effect. Vascular: Atherosclerotic calcification of the carotid siphons. No hyperdense vessel. Skull: No calvarial fracture or suspicious osseous lesion. No scalp swelling or hematoma. Sinuses/Orbits: Extensive opacity seen throughout the left middle ear cavity with complete opacification of the left mastoid air cells and some hyperostotic changes favoring chronicity, with similar findings to the comparison CT temporal bone 06/08/2019. There is opacification of much of the attic of the middle ear cavity with destructive changes of the ossicular chain as well as progressive destructive changes along the left semi circular canals including a questionable focus of dehiscence along sigmoid plate. There is chronic dehiscence of the tympanic plate anteriorly along the posterior fossa the temporomandibular joint. The tegmen tympani appears grossly intact on these non tailored images. The right mastoid air cells and paranasal sinuses are predominantly clear. Included orbital structures are unremarkable. Other: None IMPRESSION: 1. No acute intracranial abnormality. 2. Extensive opacity throughout the left middle ear cavity compatible with patient's known cholesteatoma. 3. Progressive destructive changes along the left ossicular chain. 4. Progressive destructive changes along the left semi circular canals including a questionable focus of dehiscence along the sigmoid plate. 5. Chronic dehiscence of the tympanic plate anteriorly along the posterior fossa the temporomandibular joint. 6. Consider dedicated temporal bone CT for better interrogation of the sigmoid dehiscence. This study may be reconstructed from  these images. Electronically Signed   By: Kreg Shropshire M.D.   On: 12/17/2019 22:10   DG CHEST PORT 1 VIEW  Result Date: 12/25/2019 CLINICAL DATA:  Fevers EXAM: PORTABLE CHEST 1 VIEW COMPARISON:  12/22/2019 FINDINGS: Cardiac shadow is stable. Mild aortic calcifications seen. The lungs are well aerated bilaterally. Minimal pleural fluid on the left is seen as well as new increased retrocardiac density likely representing atelectasis. No bony abnormality is noted. IMPRESSION: New left basilar atelectasis in the retrocardiac region with minimal pleural fluid. Electronically Signed   By: Alcide Clever  M.D.   On: 12/25/2019 09:57     CBC Recent Labs  Lab 12/22/19 1550 12/23/19 0020 12/26/19 0503  WBC 11.3* 13.8* 15.8*  HGB 12.9* 11.8* 10.7*  HCT 37.3* 33.8* 31.2*  PLT 329 329 416*  MCV 79.5* 78.4* 78.8*  MCH 27.5 27.4 27.0  MCHC 34.6 34.9 34.3  RDW 14.4 14.1 14.3  LYMPHSABS 1.0  --   --   MONOABS 0.6  --   --   EOSABS 0.0  --   --   BASOSABS 0.0  --   --     Chemistries  Recent Labs  Lab 12/22/19 1550 12/22/19 1550 12/22/19 1843 12/23/19 0020 12/25/19 1614 12/25/19 2200 12/26/19 0503 12/26/19 0838 12/26/19 1358  NA 120*   < > 119*   < > 124* 121* 123* 123* 125*  K 4.2  --   --    < > 4.1 3.7 3.9 3.9 3.8  CL 86*  --   --    < > 91* 90* 92* 92* 93*  CO2 22  --   --    < > 20* 19* 21* 20* 21*  GLUCOSE 149*  --   --    < > 123* 152* 126* 122* 131*  BUN 13  --   --    < > 17 18 20 20 21   CREATININE 1.00  --   --    < > 0.88 0.90 1.02 0.92 1.04  CALCIUM 9.1  --   --    < > 8.8* 8.4* 8.4* 8.6* 8.6*  MG  --   --  1.6*  --   --   --  2.2  --   --   AST 15  --   --   --   --   --   --   --   --   ALT 10  --   --   --   --   --   --   --   --   ALKPHOS 58  --   --   --   --   --   --   --   --   BILITOT 0.8  --   --   --   --   --   --   --   --    < > = values in this interval not displayed.    ------------------------------------------------------------------------------------------------------------------ No results for input(s): CHOL, HDL, LDLCALC, TRIG, CHOLHDL, LDLDIRECT in the last 72 hours.  Lab Results  Component Value Date   HGBA1C 6.3 (H) 10/25/2019   ------------------------------------------------------------------------------------------------------------------ No results for input(s): TSH, T4TOTAL, T3FREE, THYROIDAB in the last 72 hours.  Invalid input(s): FREET3 ------------------------------------------------------------------------------------------------------------------ No results for input(s): VITAMINB12, FOLATE, FERRITIN, TIBC, IRON, RETICCTPCT in the last 72 hours.  Coagulation profile Recent Labs  Lab 12/22/19 1550  INR 1.1    No results for input(s): DDIMER in the last 72 hours.  Cardiac Enzymes No results for input(s): CKMB, TROPONINI, MYOGLOBIN in the last 168 hours.  Invalid input(s): CK ------------------------------------------------------------------------------------------------------------------ No results found for: BNP   Kathie Dike M.D on 12/26/2019 at 7:23 PM  Go to [email or web address] - for contact info  [facility] - Office  [phone]

## 2019-12-26 NOTE — Progress Notes (Signed)
Pt is more alert and communicating better than previous shift with the patient. He is able to tell me his name, DOB and that he is at [facility]." He took po meds and sips of water appropriately again tonight. He still however is having hiccups. Will continue to monitor patient.

## 2019-12-27 LAB — GLUCOSE, CAPILLARY
Glucose-Capillary: 105 mg/dL — ABNORMAL HIGH (ref 70–99)
Glucose-Capillary: 119 mg/dL — ABNORMAL HIGH (ref 70–99)
Glucose-Capillary: 103 mg/dL — ABNORMAL HIGH (ref 70–99)
Glucose-Capillary: 103 mg/dL — ABNORMAL HIGH (ref 70–99)
Glucose-Capillary: 102 mg/dL — ABNORMAL HIGH (ref 70–99)
Glucose-Capillary: 124 mg/dL — ABNORMAL HIGH (ref 70–99)
Glucose-Capillary: 114 mg/dL — ABNORMAL HIGH (ref 70–99)

## 2019-12-27 LAB — BASIC METABOLIC PANEL
Anion gap: 11 (ref 5–15)
BUN: 26 mg/dL — ABNORMAL HIGH (ref 8–23)
CO2: 20 mmol/L — ABNORMAL LOW (ref 22–32)
Calcium: 8.7 mg/dL — ABNORMAL LOW (ref 8.9–10.3)
Chloride: 96 mmol/L — ABNORMAL LOW (ref 98–111)
Creatinine, Ser: 0.96 mg/dL (ref 0.61–1.24)
GFR calc Af Amer: >60 mL/min (ref >60)
GFR calc non Af Amer: >60 mL/min (ref >60)
Glucose, Bld: 103 mg/dL — ABNORMAL HIGH (ref 70–99)
Potassium: 3.8 mmol/L (ref 3.5–5.1)
Sodium: 127 mmol/L — ABNORMAL LOW (ref 135–145)

## 2019-12-27 LAB — URINE CULTURE
Culture: NO GROWTH
Special Requests: NORMAL

## 2019-12-27 LAB — CBC
HCT: 29.8 % — ABNORMAL LOW (ref 39.0–52.0)
Hemoglobin: 10.3 g/dL — ABNORMAL LOW (ref 13.0–17.0)
MCH: 27.7 pg (ref 26.0–34.0)
MCHC: 34.6 g/dL (ref 30.0–36.0)
MCV: 80.1 fL (ref 80.0–100.0)
Platelets: 395 10*3/uL (ref 150–400)
RBC: 3.72 MIL/uL — ABNORMAL LOW (ref 4.22–5.81)
RDW: 14.6 % (ref 11.5–15.5)
WBC: 13 10*3/uL — ABNORMAL HIGH (ref 4.0–10.5)
nRBC: 0 % (ref 0.0–0.2)

## 2019-12-27 MED ORDER — BACLOFEN 10 MG PO TABS
5.0000 mg | ORAL_TABLET | Freq: Four times a day (QID) | ORAL | Status: DC | PRN
  Administered 2019-12-27: 5 mg via ORAL
  Filled 2019-12-27: qty 1

## 2019-12-27 MED ORDER — CHLORPROMAZINE HCL 25 MG PO TABS: 25.0000 mg | ORAL_TABLET | Freq: Once | ORAL | Status: DC

## 2019-12-27 MED ORDER — METOPROLOL TARTRATE 50 MG PO TABS
25.0000 mg | ORAL_TABLET | Freq: Two times a day (BID) | ORAL | Status: DC
  Administered 2019-12-27 – 2019-12-31 (×8): 25 mg via ORAL
  Filled 2019-12-27 (×9): qty 1

## 2019-12-27 MED ORDER — INSULIN ASPART 100 UNIT/ML ~~LOC~~ SOLN: 0.0000 [IU] | Freq: Four times a day (QID) | SUBCUTANEOUS | Status: DC

## 2019-12-27 NOTE — Progress Notes (Signed)
Patient has had 3 large watery, mucous-like bowel movements. Dr. Kerry Hough notified.

## 2019-12-27 NOTE — Progress Notes (Signed)
Patient Demographics:    Michael Rivas, is a 64 y.o. male, DOB - [birth year], HQI:[id number]  Admit date - 12/22/2019   Admitting Physician No admitting provider for patient encounter.  Outpatient Primary MD for the patient is [name], MD  LOS - 5   Chief Complaint  Patient presents with   Altered Mental Status        Subjective:    Michael Rivas patient is more awake today.  Denies any shortness of breath.  He has been taking p.o. intake.   Assessment  & Plan :    Principal Problem:   Acute hyponatremia Active Problems:   Essential hypertension, benign   History of non-ST elevation myocardial infarction (NSTEMI)   Alcohol dependence (HCC)   Coronary artery disease of native artery of native heart with stable angina pectoris (HCC)   Prediabetes   Cholesteatoma  Brief Summary -64 y.o. male with a history of CAD s/p recent NSTEMI, (10/2019), HTN, glucose intolerance, alcohol and tobacco abuse admitted on 12/22/2019 with hyponatremia with sodium around 120   A/p 1)Persistent Hyponatremia--- SIADH Versus beer Potomania--informal over the latter as patient's the week preceding admission patient's beer intake was very minimal--- and [name]  -serial sodium noted to be in the range of 119 (patient sodium usually runs between 127 and 130) -Hydrated aggressively and hyponatremia persist -Urine osmolarity is 813 -Urine sodium is 140 and TSH is 2.7 -Discussed with Dr. Kathrene Bongo and Dr Ronalee Belts ( nephrologist)  -Sodium is up to 127 -Okay to give sodium chloride tablets 1 g twice daily  2)Acute metabolic encephalopathy--suspect due to #1 above -MRI brain negative for acute infarct -ABG did not show any acidosis -avoid further sedatives like ativan. -mental status appears to be improving.  3)Glucose  intolerance--- A1c was 6.3 on 10/25/2019 - allow some permissive hyperglycemia due to altered mentation with no oral intake at this time  -use Novolog/Humalog Sliding scale insulin with Accu-Cheks/Fingersticks as ordered   4)Febrile illness--WBC 13.8, blood cultures NGTD chest x-ray unremarkable , UA does not show signs of infection -Chest x-ray without definite pneumonia ?atelectasis -discontinue further antibiotics  5)H/o CAD--s/p NSTEMI 10/2019-- s/p LHC: "Partially successful angioplasty LCx. Residual calcific 50-60% stenosis, which will need atherectomy at a staged procedure. Also has severe RCA disease. Continue medical management   with Aspirin, metoprolol and Lipitor -stable on oral lopressor  6)Etoh Abuse--no signs of alcohol withdrawal. His girlfriend reports that last drink of alcohol almost 1 week prior to admission. Continue to monitor.  7)HTN-2 uncontrolled, IV metoprolol scheduled for CAD as above #5 -- stable on lopressor -May use IV labetalol as needed elevated BP, watch for bradycardia  8)Left side middle ear and mastoid cholesteatoma - Followed by ENT (Dr. Serena Colonel) who recommended tympanomastoidectomy back in 06/21/19; he has been unable to complete this surgery d/t sevral cancellations d/t low sodium levels  Disposition/Need for in-Hospital Stay- [name] -Patient From: home D/C Place: home Barriers: Not Clinically Stable-we will transfer out of stepdown today.  Physical therapy evaluation.  Code Status : full  Family Communication:   Discussed  with his live-in girlfriend [name] at bedside  Consults  :  --Curbside consultation with nephrology service  DVT Prophylaxis  :  Lovenox -  - SCDs  Lab Results  Component Value Date   PLT 395 12/27/2019    Inpatient Medications  Scheduled Meds:  aspirin EC  81 mg Oral Daily   atorvastatin  80 mg Oral q1800     chlorhexidine  15 mL Mouth Rinse BID   Chlorhexidine Gluconate Cloth  6 each Topical Daily   enoxaparin (LOVENOX) injection  40 mg Subcutaneous QHS   folic acid  1 mg Oral Daily   insulin aspart  0-5 Units Subcutaneous QHS   insulin aspart  0-5 Units Subcutaneous QID   mouth rinse  15 mL Mouth Rinse q12n4p   megestrol  40 mg Oral BID   metoprolol tartrate  25 mg Oral BID   multivitamin with minerals  1 tablet Oral Daily   pantoprazole (PROTONIX) IV  40 mg Intravenous Q24H   thiamine  100 mg Oral Daily   Or   thiamine  100 mg Intravenous Daily   ticagrelor  90 mg Oral BID   Continuous Infusions:  sodium chloride Stopped (12/27/19 1734)   PRN Meds:.acetaminophen, baclofen, HYDROcodone-acetaminophen, labetalol, ondansetron **OR** ondansetron (ZOFRAN) IV, ondansetron    Anti-infectives (From admission, onward)   Start     Dose/Rate Route Frequency Ordered Stop   12/26/19 1200  cefTRIAXone (ROCEPHIN) 1 g in sodium chloride 0.9 % 100 mL IVPB  Status:  Discontinued     1 g 200 mL/hr over 30 Minutes Intravenous Every 24 hours 12/26/19 0955 12/27/19 1909   12/23/19 0400  piperacillin-tazobactam (ZOSYN) IVPB 3.375 g  Status:  Discontinued     3.375 g 12.5 mL/hr over 240 Minutes Intravenous Every 8 hours 12/23/19 0325 12/26/19 0955        Objective:   Vitals:   12/27/19 1500 12/27/19 1605 12/27/19 1700 12/27/19 1800  BP: (!) 152/66  (!) 153/60 (!) 156/78  Pulse: 66 71 69 71  Resp: 16 16 (!) 21 18  Temp:  99 F (37.2 C)    TempSrc:  Oral    SpO2: 100% 99% 99% 99%  Weight:      Height:        Wt Readings from Last 3 Encounters:  12/26/19 62.1 kg  12/17/19 68 kg  11/07/19 68.9 kg     Intake/Output Summary (Last 24 hours) at 12/27/2019 1910 Last data filed at 12/27/2019 1740 Gross per 24 hour  Intake 1074.73 ml  Output 1200 ml  Net -125.27 ml     Physical Exam  General exam: Alert, awake, oriented x 3 Respiratory system: Clear to auscultation.  Respiratory effort normal. Cardiovascular system:RRR. No murmurs, rubs, gallops. Gastrointestinal system: Abdomen is nondistended, soft and nontender. No organomegaly or masses felt. Normal bowel sounds heard. Central nervous system: Alert and oriented. No focal neurological deficits. Extremities: No C/C/E, +pedal pulses Skin: No rashes, lesions or ulcers Psychiatry: Judgement and insight appear normal. Mood & affect appropriate.      Data Review:   Micro Results Recent Results (from the past 240 hour(s))  SARS Coronavirus 2 by RT PCR (hospital order, performed in [facility] hospital lab) Nasopharyngeal Nasopharyngeal Swab     Status: None   Collection Time: 12/22/19  3:37 PM   Specimen: Nasopharyngeal Swab  Result Value Ref Range Status   SARS Coronavirus 2 NEGATIVE NEGATIVE Final    Comment: (NOTE) SARS-CoV-2 target nucleic acids are NOT DETECTED.  The SARS-CoV-2 RNA is generally detectable in upper and lower respiratory specimens during the acute phase of infection. The lowest concentration of SARS-CoV-2 viral copies this assay can detect is 250 copies / mL. A negative result does not preclude SARS-CoV-2 infection and should not be used as the sole basis for treatment or other patient management decisions.  A negative result may occur with improper specimen collection / handling, submission of specimen other than nasopharyngeal swab, presence of viral mutation(s) within the areas targeted by this assay, and inadequate number of viral copies (<250 copies / mL). A negative result must be combined with clinical observations, patient history, and epidemiological information. Fact Sheet for Patients:   BoilerBrush.com.cy Fact Sheet for Healthcare Providers: https://pope.com/ This test is not yet approved or cleared  by the [place] FDA and has been authorized for detection and/or diagnosis of SARS-CoV-2 by FDA under an Emergency  Use Authorization (EUA).  This EUA will remain in effect (meaning this test can be used) for the duration of the COVID-19 declaration under Section 564(b)(1) of the Act, 21 U.S.C. section 360bbb-3(b)(1), unless the authorization is terminated or revoked sooner. Performed at [facility], [address], [place], [place] [address]   MRSA PCR Screening     Status: None   Collection Time: 12/23/19  4:20 AM   Specimen: Nasal Mucosa; Nasopharyngeal  Result Value Ref Range Status   MRSA by PCR NEGATIVE NEGATIVE Final    Comment:        The GeneXpert MRSA Assay (FDA approved for NASAL specimens only), is one component of a comprehensive MRSA colonization surveillance program. It is not intended to diagnose MRSA infection nor to guide or monitor treatment for MRSA infections. Performed at [facility], [address] [place], [place] [address]   Culture, blood (routine x 2)     Status: None (Preliminary result)   Collection Time: 12/23/19  5:06 AM   Specimen: Left Antecubital; Blood  Result Value Ref Range Status   Specimen Description   Final    LEFT ANTECUBITAL BOTTLES DRAWN AEROBIC AND ANAEROBIC   Special Requests Blood Culture adequate volume  Final   Culture   Final    NO GROWTH 4 DAYS Performed at [facility], [address], [place], [place] [address]    Report Status PENDING  Incomplete  Culture, blood (routine x 2)     Status: None (Preliminary result)   Collection Time: 12/23/19  6:03 AM   Specimen: Left Antecubital; Blood  Result Value Ref Range Status   Specimen Description   Final    LEFT ANTECUBITAL BOTTLES DRAWN AEROBIC AND ANAEROBIC   Special Requests Blood Culture adequate volume  Final   Culture   Final    NO GROWTH 4 DAYS Performed at [facility], [address], [place], [place] [address]    Report Status PENDING  Incomplete  Urine Culture     Status: None   Collection Time: 12/25/19  7:28 PM   Specimen: Urine, Clean Catch  Result Value Ref Range Status     Specimen Description   Final    URINE, CLEAN CATCH Performed at [facility], [address] [place], [place] [address]    Special Requests   Final    Normal Performed at [facility], [address], [place], [place] [address]    Culture   Final    NO GROWTH Performed at [facility], [address], [place], [place] [address]    Report Status  12/27/2019 FINAL  Final    Radiology Reports DG Chest 1 View  Result Date: 12/22/2019 CLINICAL DATA:  Headaches and dizziness EXAM: CHEST  1 VIEW COMPARISON:  10/24/2019 FINDINGS: The heart size and mediastinal contours are within normal limits. Both lungs are clear. The visualized skeletal structures are unremarkable. IMPRESSION: No active disease. Electronically Signed   By: Inez Catalina M.D.   On: 12/22/2019 15:43   CT HEAD WO CONTRAST  Result Date: 12/22/2019 CLINICAL DATA:  Headache, dizziness, altered level of consciousness EXAM: CT HEAD WITHOUT CONTRAST TECHNIQUE: Contiguous axial images were obtained from the base of the skull through the vertex without intravenous contrast. COMPARISON:  12/17/2019, 06/07/2019 FINDINGS: Brain: Evaluation is limited due to patient motion during the exam. No evidence of acute infarct or hemorrhage. The lateral ventricles and midline structures are grossly unremarkable. There are no acute extra-axial fluid collections. No mass effect. Vascular: No hyperdense vessel or unexpected calcification. Skull: Normal. Negative for fracture or focal lesion. Sinuses/Orbits: The destructive process centered in the left middle ear seen on prior CT head and temporal bone examinations is unchanged. Chronic opacification of the left mastoid air cells. The remainder of the paranasal sinuses and right mastoid air cells are unremarkable. Other: None. IMPRESSION: 1. Limited study due to patient motion. 2. No acute infarct or hemorrhage. 3. Stable destructive process centered in the left middle ear, compatible with cholesteatoma  reported on prior temporal bone CT 06/07/2019. Electronically Signed   By: Randa Ngo M.D.   On: 12/22/2019 15:41   CT Head Wo Contrast  Result Date: 12/17/2019 CLINICAL DATA:  Ear pain and headache for several days, acutely worsening EXAM: CT HEAD WITHOUT CONTRAST TECHNIQUE: Contiguous axial images were obtained from the base of the skull through the vertex without intravenous contrast. COMPARISON:  Temporal bone CT 11 5 2020 FINDINGS: Brain: No evidence of acute infarction, hemorrhage, hydrocephalus, extra-axial collection or mass lesion/mass effect. Vascular: Atherosclerotic calcification of the carotid siphons. No hyperdense vessel. Skull: No calvarial fracture or suspicious osseous lesion. No scalp swelling or hematoma. Sinuses/Orbits: Extensive opacity seen throughout the left middle ear cavity with complete opacification of the left mastoid air cells and some hyperostotic changes favoring chronicity, with similar findings to the comparison CT temporal bone 06/08/2019. There is opacification of much of the attic of the middle ear cavity with destructive changes of the ossicular chain as well as progressive destructive changes along the left semi circular canals including a questionable focus of dehiscence along sigmoid plate. There is chronic dehiscence of the tympanic plate anteriorly along the posterior fossa the temporomandibular joint. The tegmen tympani appears grossly intact on these non tailored images. The right mastoid air cells and paranasal sinuses are predominantly clear. Included orbital structures are unremarkable. Other: None IMPRESSION: 1. No acute intracranial abnormality. 2. Extensive opacity throughout the left middle ear cavity compatible with patient's known cholesteatoma. 3. Progressive destructive changes along the left ossicular chain. 4. Progressive destructive changes along the left semi circular canals including a questionable focus of dehiscence along the sigmoid plate. 5.  Chronic dehiscence of the tympanic plate anteriorly along the posterior fossa the temporomandibular joint. 6. Consider dedicated temporal bone CT for better interrogation of the sigmoid dehiscence. This study may be reconstructed from these images. Electronically Signed   By: Lovena Le M.D.   On: 12/17/2019 22:10   MR BRAIN WO CONTRAST  Result Date: 12/26/2019 CLINICAL DATA:  Encephalopathy EXAM: MRI HEAD WITHOUT CONTRAST TECHNIQUE: Multiplanar, multiecho pulse sequences of the  brain and surrounding structures were obtained without intravenous contrast. COMPARISON:  12/22/2019 head CT FINDINGS: The examination is severely degraded by motion and patient altered mental status. There is generalized volume loss. No midline shift or other significant mass effect. No hydrocephalus. No large territory infarct. IMPRESSION: 1. Severely motion degraded examination. 2. Within that limitation, no large territory infarct. Electronically Signed   By: Deatra Robinson M.D.   On: 12/26/2019 20:59   DG CHEST PORT 1 VIEW  Result Date: 12/25/2019 CLINICAL DATA:  Fevers EXAM: PORTABLE CHEST 1 VIEW COMPARISON:  12/22/2019 FINDINGS: Cardiac shadow is stable. Mild aortic calcifications seen. The lungs are well aerated bilaterally. Minimal pleural fluid on the left is seen as well as new increased retrocardiac density likely representing atelectasis. No bony abnormality is noted. IMPRESSION: New left basilar atelectasis in the retrocardiac region with minimal pleural fluid. Electronically Signed   By: Alcide Clever M.D.   On: 12/25/2019 09:57     CBC Recent Labs  Lab 12/22/19 1550 12/23/19 0020 12/26/19 0503 12/27/19 0344  WBC 11.3* 13.8* 15.8* 13.0*  HGB 12.9* 11.8* 10.7* 10.3*  HCT 37.3* 33.8* 31.2* 29.8*  PLT 329 329 416* 395  MCV 79.5* 78.4* 78.8* 80.1  MCH 27.5 27.4 27.0 27.7  MCHC 34.6 34.9 34.3 34.6  RDW 14.4 14.1 14.3 14.6  LYMPHSABS 1.0  --   --   --   MONOABS 0.6  --   --   --   EOSABS 0.0  --   --   --    BASOSABS 0.0  --   --   --     Chemistries  Recent Labs  Lab 12/22/19 1550 12/22/19 1550 12/22/19 1843 12/23/19 0020 12/26/19 0503 12/26/19 0838 12/26/19 1358 12/26/19 2135 12/27/19 0908  NA 120*   < > 119*   < > 123* 123* 125* 125* 127*  K 4.2  --   --    < > 3.9 3.9 3.8 3.7 3.8  CL 86*  --   --    < > 92* 92* 93* 92* 96*  CO2 22  --   --    < > 21* 20* 21* 21* 20*  GLUCOSE 149*  --   --    < > 126* 122* 131* 116* 103*  BUN 13  --   --    < > 20 20 21 22  26*  CREATININE 1.00  --   --    < > 1.02 0.92 1.04 0.99 0.96  CALCIUM 9.1  --   --    < > 8.4* 8.6* 8.6* 8.5* 8.7*  MG  --   --  1.6*  --  2.2  --   --   --   --   AST 15  --   --   --   --   --   --   --   --   ALT 10  --   --   --   --   --   --   --   --   ALKPHOS 58  --   --   --   --   --   --   --   --   BILITOT 0.8  --   --   --   --   --   --   --   --    < > = values in this interval not displayed.   ------------------------------------------------------------------------------------------------------------------ No results for input(s): CHOL, HDL, LDLCALC,  TRIG, CHOLHDL, LDLDIRECT in the last 72 hours.  Lab Results  Component Value Date   HGBA1C 6.3 (H) 10/25/2019   ------------------------------------------------------------------------------------------------------------------ No results for input(s): TSH, T4TOTAL, T3FREE, THYROIDAB in the last 72 hours.  Invalid input(s): FREET3 ------------------------------------------------------------------------------------------------------------------ No results for input(s): VITAMINB12, FOLATE, FERRITIN, TIBC, IRON, RETICCTPCT in the last 72 hours.  Coagulation profile Recent Labs  Lab 12/22/19 1550  INR 1.1    No results for input(s): DDIMER in the last 72 hours.  Cardiac Enzymes No results for input(s): CKMB, TROPONINI, MYOGLOBIN in the last 168 hours.  Invalid input(s):  CK ------------------------------------------------------------------------------------------------------------------ No results found for: BNP   Erick Blinks M.D on 12/27/2019 at 7:10 PM  Go to [email or web address] - for contact info  [facility] - Office  [phone]

## 2019-12-27 NOTE — Progress Notes (Signed)
Informed by staff that patient had 3 loose bowel movements earlier today.  She does not have any reported abdominal pain.  He has not received any laxatives recently.  He had a mild fever yesterday, but none since then.  Has mild leukocytosis 13,000.  He has been receiving IV antibiotics which were discontinued today.  Discussed with Dr. Lowell Guitar with recommendations to monitor patient overnight.  If he continues to have loose stools overnight, can consider C. difficile testing in a.m.  [employer]

## 2019-12-28 LAB — GLUCOSE, CAPILLARY
Glucose-Capillary: 109 mg/dL — ABNORMAL HIGH (ref 70–99)
Glucose-Capillary: 113 mg/dL — ABNORMAL HIGH (ref 70–99)
Glucose-Capillary: 120 mg/dL — ABNORMAL HIGH (ref 70–99)
Glucose-Capillary: 125 mg/dL — ABNORMAL HIGH (ref 70–99)
Glucose-Capillary: 145 mg/dL — ABNORMAL HIGH (ref 70–99)
Glucose-Capillary: 121 mg/dL — ABNORMAL HIGH (ref 70–99)

## 2019-12-28 LAB — BASIC METABOLIC PANEL
Anion gap: 10 (ref 5–15)
BUN: 21 mg/dL (ref 8–23)
CO2: 19 mmol/L — ABNORMAL LOW (ref 22–32)
Calcium: 8.2 mg/dL — ABNORMAL LOW (ref 8.9–10.3)
Chloride: 98 mmol/L (ref 98–111)
Creatinine, Ser: 0.8 mg/dL (ref 0.61–1.24)
GFR calc Af Amer: >60 mL/min (ref >60)
GFR calc non Af Amer: >60 mL/min (ref >60)
Glucose, Bld: 109 mg/dL — ABNORMAL HIGH (ref 70–99)
Potassium: 3.6 mmol/L (ref 3.5–5.1)
Sodium: 127 mmol/L — ABNORMAL LOW (ref 135–145)

## 2019-12-28 LAB — CULTURE, BLOOD (ROUTINE X 2)
Culture: NO GROWTH
Culture: NO GROWTH
Special Requests: ADEQUATE
Special Requests: ADEQUATE

## 2019-12-28 LAB — C DIFFICILE QUICK SCREEN W PCR REFLEX
C Diff antigen: NEGATIVE
C Diff interpretation: NOT DETECTED
C Diff toxin: NEGATIVE

## 2019-12-28 MED ORDER — INSULIN ASPART 100 UNIT/ML ~~LOC~~ SOLN
0.0000 [IU] | Freq: Three times a day (TID) | SUBCUTANEOUS | Status: DC
  Administered 2019-12-29 – 2019-12-31 (×4): 1 [IU] via SUBCUTANEOUS

## 2019-12-28 MED ORDER — PANTOPRAZOLE SODIUM 40 MG PO TBEC
40.0000 mg | DELAYED_RELEASE_TABLET | Freq: Every day | ORAL | Status: DC
  Administered 2019-12-28 – 2019-12-31 (×4): 40 mg via ORAL
  Filled 2019-12-28 (×3): qty 1

## 2019-12-28 MED ORDER — AMLODIPINE BESYLATE 5 MG PO TABS
5.0000 mg | ORAL_TABLET | Freq: Every day | ORAL | Status: DC
  Administered 2019-12-28 – 2019-12-31 (×4): 5 mg via ORAL
  Filled 2019-12-28 (×4): qty 1

## 2019-12-28 MED ORDER — INSULIN ASPART 100 UNIT/ML ~~LOC~~ SOLN: 0.0000 [IU] | Freq: Every day | SUBCUTANEOUS | Status: DC

## 2019-12-28 MED ORDER — LOPERAMIDE HCL 2 MG PO CAPS
2.0000 mg | ORAL_CAPSULE | Freq: Once | ORAL | Status: AC
  Administered 2019-12-28: 2 mg via ORAL
  Filled 2019-12-28: qty 1

## 2019-12-28 NOTE — Evaluation (Addendum)
Physical Therapy Evaluation Patient Details Name: Michael Rivas MRN: [id number] DOB: 30-Nov-1955 Today's Date: 12/28/2019   History of Present Illness  Michael Rivas is a 64 y.o. male with a history of CAD s/p recent NSTEMI, cholesteatoma in March, HTN, prediabetes.  Patient seen due to worsening confusion for the past 3 days.  He is oriented to person and place, but not to time.  He is present with his wife who relays his increased confusion.  No palliating or provoking factors.    Clinical Impression  Patient limited for functional mobility as stated below secondary to BLE weakness, fatigue and poor standing balance. Patient requires min assist for bed mobility. He requires mod/max assist for transfers, ambulation, and to remain standing. He demonstrates impaired standing tolerance and fatigues quickly. Patient able to transfer to and from [facility] today with assist. Patient left in bed with nursing present. Patient will benefit from continued physical therapy in hospital and recommended venue below to increase strength, balance, endurance for safe ADLs and gait.     Follow Up Recommendations SNF    Equipment Recommendations  None recommended by PT    Recommendations for Other Services       Precautions / Restrictions Precautions Precautions: Fall Restrictions Weight Bearing Restrictions: No      Mobility  Bed Mobility Overal bed mobility: Needs Assistance Bed Mobility: Supine to Sit;Sit to Supine     Supine to sit: Min assist;HOB elevated Sit to supine: Min assist   General bed mobility comments: slow, labored, pull on therapist hand to sit  Transfers Overall transfer level: Needs assistance Equipment used: Rolling walker (2 wheeled) Transfers: Sit to/from [employer] Sit to Stand: Mod assist;Max assist Stand pivot transfers: Max assist;Mod assist       General transfer comment: physical assist with RW to transfer to standing, very unsteady upon standing,  transfer to and from [facility]  Ambulation/Gait Ambulation/Gait assistance: Mod assist;Max assist Gait Distance (Feet): 2 Feet Assistive device: Rolling walker (2 wheeled) Gait Pattern/deviations: Shuffle Gait velocity: decreased   General Gait Details: mod/max assist for ambulation with RW, unsteady, fatigues quickly  Stairs            Wheelchair Mobility    Modified Rankin (Stroke Patients Only)       Balance Overall balance assessment: Needs assistance   Sitting balance-Leahy Scale: Good Sitting balance - Comments: seated EOB     Standing balance-Leahy Scale: Zero Standing balance comment: using RW and physical assist                             Pertinent Vitals/Pain Pain Assessment: No/denies pain    Home Living Family/patient expects to be discharged to:: Private residence Living Arrangements: Spouse/significant other Available Help at Discharge: Family;Available 24 hours/day Type of Home: Mobile home Home Access: Stairs to enter Entrance Stairs-Rails: Doctor, general practice of Steps: 4 Home Layout: One level Home Equipment: Cane - single point      Prior Function Level of Independence: Independent with assistive device(s)         Comments: Patient states independent with SPC occasionally, does not require assist with ADL     Hand Dominance        Extremity/Trunk Assessment   Upper Extremity Assessment Upper Extremity Assessment: Generalized weakness    Lower Extremity Assessment Lower Extremity Assessment: Generalized weakness    Cervical / Trunk Assessment Cervical / Trunk Assessment: Normal  Communication   Communication: No  difficulties  Cognition Arousal/Alertness: Awake/alert Behavior During Therapy: WFL for tasks assessed/performed Overall Cognitive Status: Within Functional Limits for tasks assessed                                        General Comments      Exercises      Assessment/Plan    PT Assessment Patient needs continued PT services  PT Problem List Decreased strength;Decreased mobility;Decreased activity tolerance;Decreased balance;Decreased knowledge of use of DME       PT Treatment Interventions DME instruction;Therapeutic exercise;Gait training;Balance training;Stair training;Neuromuscular re-education;Functional mobility training;Therapeutic activities;Patient/family education    PT Goals (Current goals can be found in the Care Plan section)  Acute Rehab PT Goals Patient Stated Goal: Get stronger and go home PT Goal Formulation: With patient Time For Goal Achievement: 01/11/20 Potential to Achieve Goals: Fair    Frequency Min 3X/week   Barriers to discharge        Co-evaluation               AM-PAC PT "6 Clicks" Mobility  Outcome Measure Help needed turning from your back to your side while in a flat bed without using bedrails?: None Help needed moving from lying on your back to sitting on the side of a flat bed without using bedrails?: A Little Help needed moving to and from a bed to a chair (including a wheelchair)?: Total Help needed standing up from a chair using your arms (e.g., wheelchair or bedside chair)?: A Lot Help needed to walk in hospital room?: Total Help needed climbing 3-5 steps with a railing? : Total 6 Click Score: 12    End of Session Equipment Utilized During Treatment: Gait belt Activity Tolerance: Patient tolerated treatment well;Patient limited by fatigue Patient left: in bed;with call bell/phone within reach;with nursing/sitter in room Nurse Communication: Mobility status PT Visit Diagnosis: Unsteadiness on feet (R26.81);Other abnormalities of gait and mobility (R26.89);Muscle weakness (generalized) (M62.81)    Time: 1105-1130 PT Time Calculation (min) (ACUTE ONLY): 25 min   Charges:   PT Evaluation $PT Eval Moderate Complexity: 1 Mod PT Treatments $Therapeutic Activity: 8-22 mins        12:16 PM, 12/28/19 Mearl Latin PT, DPT Physical Therapist at [facility]

## 2019-12-28 NOTE — Progress Notes (Signed)
Patient Demographics:    Michael Rivas, is a 64 y.o. male, DOB - 1956/02/19, VQM:[id number]  Admit date - 12/22/2019   Admitting Physician No admitting provider for patient encounter.  Outpatient Primary MD for the patient is [name], MD  LOS - 6   Chief Complaint  Patient presents with  . Altered Mental Status        Subjective:    Michael Rivas reports continued loose stools overnight.  Has some abdominal pain.  No shortness of breath or cough.   Assessment  & Plan :    Principal Problem:   Acute hyponatremia Active Problems:   Essential hypertension, benign   History of non-ST elevation myocardial infarction (NSTEMI)   Alcohol dependence (HCC)   Coronary artery disease of native artery of native heart with stable angina pectoris (HCC)   Prediabetes   Cholesteatoma  Brief Summary -64 y.o. male with a history of CAD s/p recent NSTEMI, (10/2019), HTN, glucose intolerance, alcohol and tobacco abuse admitted on 12/22/2019 with hyponatremia with sodium around 120   A/p 1)Persistent Hyponatremia--- SIADH Versus beer Potomania--informal over the latter as patient's the week preceding admission patient's beer intake was very minimal--- and [name]  -serial sodium noted to be in the range of 119 (patient sodium usually runs between 127 and 130) -Hydrated aggressively and hyponatremia persist -Urine osmolarity is 813 -Urine sodium is 140 and TSH is 2.7 -Discussed with Dr. Kathrene Bongo and Dr Ronalee Belts ( nephrologist)  -Sodium is up to 127 -Okay to give sodium chloride tablets 1 g twice daily  2)Acute metabolic encephalopathy--suspect due to #1 above -MRI brain negative for acute infarct -ABG did not show any acidosis -avoid further sedatives like ativan. -mental status appears to be improving.  3)Glucose  intolerance--- A1c was 6.3 on 10/25/2019 - allow some permissive hyperglycemia due to altered mentation with no oral intake at this time  -use Novolog/Humalog Sliding scale insulin with Accu-Cheks/Fingersticks as ordered   4)Febrile illness--WBC 13.8, blood cultures NGTD chest x-ray unremarkable , UA does not show signs of infection -Chest x-ray without definite pneumonia ?atelectasis -discontinue further antibiotics  5)H/o CAD--s/p NSTEMI 10/2019-- s/p LHC: "Partially successful angioplasty LCx. Residual calcific 50-60% stenosis, which will need atherectomy at a staged procedure. Also has severe RCA disease. Continue medical management   with Aspirin, metoprolol and Lipitor -stable on oral lopressor  6)Etoh Abuse--no signs of alcohol withdrawal. His girlfriend reports that last drink of alcohol almost 1 week prior to admission. Continue to monitor.  7)HTN-2 uncontrolled -Currently on Lopressor -Amlodipine since blood pressures are running high  8)Left side middle ear and mastoid cholesteatoma - Followed by ENT (Dr. Serena Colonel) who recommended tympanomastoidectomy back in 06/21/19; he has been unable to complete this surgery d/t sevral cancellations d/t low sodium levels  9) diarrhea.  Patient was having significant loose stools with past medical records.  No recent laxatives.  He has recently received antibiotics and was having abdominal pain.  Discussed with Dr. Lowell Guitar and C diff ordered. This was found to be negative.  Will use as needed Imodium.  Disposition/Need for in-Hospital Stay- patient unable to be discharged at this time due to -persistent and  symptomatic hyponatremia requiring IV Lasix -Patient From: home D/C Place: home Barriers: Not Clinically Stable-we will transfer out of stepdown today.  Physical therapy evaluation.  Code Status : full  Family Communication:   Discussed with his live-in girlfriend Michael Rivas at bedside  Consults  :  --Curbside consultation with  nephrology service  DVT Prophylaxis  :  Lovenox -  - SCDs  Lab Results  Component Value Date   PLT 395 12/27/2019    Inpatient Medications  Scheduled Meds: . aspirin EC  81 mg Oral Daily  . atorvastatin  80 mg Oral q1800  . Chlorhexidine Gluconate Cloth  6 each Topical Daily  . enoxaparin (LOVENOX) injection  40 mg Subcutaneous QHS  . folic acid  1 mg Oral Daily  . insulin aspart  0-5 Units Subcutaneous QHS  . insulin aspart  0-5 Units Subcutaneous QID  . loperamide  2 mg Oral Once  . megestrol  40 mg Oral BID  . metoprolol tartrate  25 mg Oral BID  . multivitamin with minerals  1 tablet Oral Daily  . pantoprazole  40 mg Oral Daily  . thiamine  100 mg Oral Daily   Or  . thiamine  100 mg Intravenous Daily  . ticagrelor  90 mg Oral BID   Continuous Infusions: . sodium chloride Stopped (12/28/19 1117)   PRN Meds:.acetaminophen, baclofen, HYDROcodone-acetaminophen, labetalol, ondansetron **OR** ondansetron (ZOFRAN) IV, ondansetron    Anti-infectives (From admission, onward)   Start     Dose/Rate Route Frequency Ordered Stop   12/26/19 1200  cefTRIAXone (ROCEPHIN) 1 g in sodium chloride 0.9 % 100 mL IVPB  Status:  Discontinued     1 g 200 mL/hr over 30 Minutes Intravenous Every 24 hours 12/26/19 0955 12/27/19 1909   12/23/19 0400  piperacillin-tazobactam (ZOSYN) IVPB 3.375 g  Status:  Discontinued     3.375 g 12.5 mL/hr over 240 Minutes Intravenous Every 8 hours 12/23/19 0325 12/26/19 0955        Objective:   Vitals:   12/28/19 1200 12/28/19 1400 12/28/19 1500 12/28/19 1600  BP: (!) 158/99     Pulse: 79 70 73   Resp: (!) 22 20 19    Temp: (!) 97.5 F (36.4 C)   98.6 F (37 C)  TempSrc: Oral   Oral  SpO2: 100% 100% 100%   Weight:      Height:        Wt Readings from Last 3 Encounters:  12/26/19 62.1 kg  12/17/19 68 kg  11/07/19 68.9 kg     Intake/Output Summary (Last 24 hours) at 12/28/2019 1945 Last data filed at 12/28/2019 1749 Gross per 24 hour    Intake 786.46 ml  Output 500 ml  Net 286.46 ml     Physical Exam  General exam: Alert, awake, oriented x 3 Respiratory system: Clear to auscultation. Respiratory effort normal. Cardiovascular system:RRR. No murmurs, rubs, gallops. Gastrointestinal system: Abdomen is nondistended, soft and diffusely tender along. No organomegaly or masses felt. Normal bowel sounds heard. Central nervous system: Alert and oriented. No focal neurological deficits. Extremities: No C/C/E, +pedal pulses Skin: No rashes, lesions or ulcers Psychiatry: Judgement and insight appear normal. Mood & affect appropriate.     Data Review:   Micro Results Recent Results (from the past 240 hour(s))  SARS Coronavirus 2 by RT PCR (hospital order, performed in [facility] hospital lab) Nasopharyngeal Nasopharyngeal Swab     Status: None   Collection Time: 12/22/19  3:37 PM   Specimen: Nasopharyngeal Swab  Result Value Ref Range Status   SARS Coronavirus 2 NEGATIVE NEGATIVE Final    Comment: (NOTE) SARS-CoV-2 target nucleic acids are NOT DETECTED. The SARS-CoV-2 RNA is generally detectable in upper and lower respiratory specimens during the acute phase of infection. The lowest concentration of SARS-CoV-2 viral copies this assay can detect is 250 copies / mL. A negative result does not preclude SARS-CoV-2 infection and should not be used as the sole basis for treatment or other patient management decisions.  A negative result may occur with improper specimen collection / handling, submission of specimen other than nasopharyngeal swab, presence of viral mutation(s) within the areas targeted by this assay, and inadequate number of viral copies (<250 copies / mL). A negative result must be combined with clinical observations, patient history, and epidemiological information. Fact Sheet for Patients:   BoilerBrush.com.cy Fact Sheet for Healthcare  Providers: https://pope.com/ This test is not yet approved or cleared  by the [place] FDA and has been authorized for detection and/or diagnosis of SARS-CoV-2 by FDA under an Emergency Use Authorization (EUA).  This EUA will remain in effect (meaning this test can be used) for the duration of the COVID-19 declaration under Section 564(b)(1) of the Act, 21 U.S.C. section 360bbb-3(b)(1), unless the authorization is terminated or revoked sooner. Performed at [facility], [address] [place], [place] [address]   MRSA PCR Screening     Status: None   Collection Time: 12/23/19  4:20 AM   Specimen: Nasal Mucosa; Nasopharyngeal  Result Value Ref Range Status   MRSA by PCR NEGATIVE NEGATIVE Final    Comment:        The GeneXpert MRSA Assay (FDA approved for NASAL specimens only), is one component of a comprehensive MRSA colonization surveillance program. It is not intended to diagnose MRSA infection nor to guide or monitor treatment for MRSA infections. Performed at [facility], [address] [place], [place] [address]   Culture, blood (routine x 2)     Status: None   Collection Time: 12/23/19  5:06 AM   Specimen: Left Antecubital; Blood  Result Value Ref Range Status   Specimen Description   Final    LEFT ANTECUBITAL BOTTLES DRAWN AEROBIC AND ANAEROBIC   Special Requests Blood Culture adequate volume  Final   Culture   Final    NO GROWTH 5 DAYS Performed at [facility], [address] [place], [place] [address]    Report Status 12/28/2019 FINAL  Final  Culture, blood (routine x 2)     Status: None   Collection Time: 12/23/19  6:03 AM   Specimen: Left Antecubital; Blood  Result Value Ref Range Status   Specimen Description   Final    LEFT ANTECUBITAL BOTTLES DRAWN AEROBIC AND ANAEROBIC   Special Requests Blood Culture adequate volume  Final   Culture   Final    NO GROWTH 5 DAYS Performed at [facility], [address], [place], [place]  [address]    Report Status 12/28/2019 FINAL  Final  Urine Culture     Status: None   Collection Time: 12/25/19  7:28 PM   Specimen: Urine, Clean Catch  Result Value Ref Range Status   Specimen Description   Final    URINE, CLEAN CATCH Performed at [facility], [address] [place], [place] [address]    Special Requests   Final    Normal Performed at [facility], [address] [place], [place] [address]    Culture   Final  NO GROWTH Performed at [facility], [address], [place], [place] [address]    Report Status 12/27/2019 FINAL  Final  C Difficile Quick Screen w PCR reflex     Status: None   Collection Time: 12/28/19 12:00 PM  Result Value Ref Range Status   C Diff antigen NEGATIVE NEGATIVE Final   C Diff toxin NEGATIVE NEGATIVE Final   C Diff interpretation No C. difficile detected.  Final    Comment: Performed at [facility], [address], [place], [place] [address]    Radiology Reports DG Chest 1 View  Result Date: 12/22/2019 CLINICAL DATA:  Headaches and dizziness EXAM: CHEST  1 VIEW COMPARISON:  10/24/2019 FINDINGS: The heart size and mediastinal contours are within normal limits. Both lungs are clear. The visualized skeletal structures are unremarkable. IMPRESSION: No active disease. Electronically Signed   By: Alcide Clever M.D.   On: 12/22/2019 15:43   CT HEAD WO CONTRAST  Result Date: 12/22/2019 CLINICAL DATA:  Headache, dizziness, altered level of consciousness EXAM: CT HEAD WITHOUT CONTRAST TECHNIQUE: Contiguous axial images were obtained from the base of the skull through the vertex without intravenous contrast. COMPARISON:  12/17/2019, 06/07/2019 FINDINGS: Brain: Evaluation is limited due to patient motion during the exam. No evidence of acute infarct or hemorrhage. The lateral ventricles and midline structures are grossly unremarkable. There are no acute extra-axial fluid collections. No mass effect. Vascular: No hyperdense vessel or unexpected  calcification. Skull: Normal. Negative for fracture or focal lesion. Sinuses/Orbits: The destructive process centered in the left middle ear seen on prior CT head and temporal bone examinations is unchanged. Chronic opacification of the left mastoid air cells. The remainder of the paranasal sinuses and right mastoid air cells are unremarkable. Other: None. IMPRESSION: 1. Limited study due to patient motion. 2. No acute infarct or hemorrhage. 3. Stable destructive process centered in the left middle ear, compatible with cholesteatoma reported on prior temporal bone CT 06/07/2019. Electronically Signed   By: Sharlet Salina M.D.   On: 12/22/2019 15:41   CT Head Wo Contrast  Result Date: 12/17/2019 CLINICAL DATA:  Ear pain and headache for several days, acutely worsening EXAM: CT HEAD WITHOUT CONTRAST TECHNIQUE: Contiguous axial images were obtained from the base of the skull through the vertex without intravenous contrast. COMPARISON:  Temporal bone CT 11 5 2020 FINDINGS: Brain: No evidence of acute infarction, hemorrhage, hydrocephalus, extra-axial collection or mass lesion/mass effect. Vascular: Atherosclerotic calcification of the carotid siphons. No hyperdense vessel. Skull: No calvarial fracture or suspicious osseous lesion. No scalp swelling or hematoma. Sinuses/Orbits: Extensive opacity seen throughout the left middle ear cavity with complete opacification of the left mastoid air cells and some hyperostotic changes favoring chronicity, with similar findings to the comparison CT temporal bone 06/08/2019. There is opacification of much of the attic of the middle ear cavity with destructive changes of the ossicular chain as well as progressive destructive changes along the left semi circular canals including a questionable focus of dehiscence along sigmoid plate. There is chronic dehiscence of the tympanic plate anteriorly along the posterior fossa the temporomandibular joint. The tegmen tympani appears grossly  intact on these non tailored images. The right mastoid air cells and paranasal sinuses are predominantly clear. Included orbital structures are unremarkable. Other: None IMPRESSION: 1. No acute intracranial abnormality. 2. Extensive opacity throughout the left middle ear cavity compatible with patient's known cholesteatoma. 3. Progressive destructive changes along the left ossicular chain. 4. Progressive destructive changes along the left  semi circular canals including a questionable focus of dehiscence along the sigmoid plate. 5. Chronic dehiscence of the tympanic plate anteriorly along the posterior fossa the temporomandibular joint. 6. Consider dedicated temporal bone CT for better interrogation of the sigmoid dehiscence. This study may be reconstructed from these images. Electronically Signed   By: Kreg Shropshire M.D.   On: 12/17/2019 22:10   MR BRAIN WO CONTRAST  Result Date: 12/26/2019 CLINICAL DATA:  Encephalopathy EXAM: MRI HEAD WITHOUT CONTRAST TECHNIQUE: Multiplanar, multiecho pulse sequences of the brain and surrounding structures were obtained without intravenous contrast. COMPARISON:  12/22/2019 head CT FINDINGS: The examination is severely degraded by motion and patient altered mental status. There is generalized volume loss. No midline shift or other significant mass effect. No hydrocephalus. No large territory infarct. IMPRESSION: 1. Severely motion degraded examination. 2. Within that limitation, no large territory infarct. Electronically Signed   By: Deatra Robinson M.D.   On: 12/26/2019 20:59   DG CHEST PORT 1 VIEW  Result Date: 12/25/2019 CLINICAL DATA:  Fevers EXAM: PORTABLE CHEST 1 VIEW COMPARISON:  12/22/2019 FINDINGS: Cardiac shadow is stable. Mild aortic calcifications seen. The lungs are well aerated bilaterally. Minimal pleural fluid on the left is seen as well as new increased retrocardiac density likely representing atelectasis. No bony abnormality is noted. IMPRESSION: New left  basilar atelectasis in the retrocardiac region with minimal pleural fluid. Electronically Signed   By: Alcide Clever M.D.   On: 12/25/2019 09:57     CBC Recent Labs  Lab 12/22/19 1550 12/23/19 0020 12/26/19 0503 12/27/19 0344  WBC 11.3* 13.8* 15.8* 13.0*  HGB 12.9* 11.8* 10.7* 10.3*  HCT 37.3* 33.8* 31.2* 29.8*  PLT 329 329 416* 395  MCV 79.5* 78.4* 78.8* 80.1  MCH 27.5 27.4 27.0 27.7  MCHC 34.6 34.9 34.3 34.6  RDW 14.4 14.1 14.3 14.6  LYMPHSABS 1.0  --   --   --   MONOABS 0.6  --   --   --   EOSABS 0.0  --   --   --   BASOSABS 0.0  --   --   --     Chemistries  Recent Labs  Lab 12/22/19 1550 12/22/19 1550 12/22/19 1843 12/23/19 0020 12/26/19 0503 12/26/19 0503 12/26/19 1610 12/26/19 1358 12/26/19 2135 12/27/19 0908 12/28/19 0317  NA 120*   < > 119*   < > 123*   < > 123* 125* 125* 127* 127*  K 4.2  --   --    < > 3.9   < > 3.9 3.8 3.7 3.8 3.6  CL 86*  --   --    < > 92*   < > 92* 93* 92* 96* 98  CO2 22  --   --    < > 21*   < > 20* 21* 21* 20* 19*  GLUCOSE 149*  --   --    < > 126*   < > 122* 131* 116* 103* 109*  BUN 13  --   --    < > 20   < > 20 21 22  26* 21  CREATININE 1.00  --   --    < > 1.02   < > 0.92 1.04 0.99 0.96 0.80  CALCIUM 9.1  --   --    < > 8.4*   < > 8.6* 8.6* 8.5* 8.7* 8.2*  MG  --   --  1.6*  --  2.2  --   --   --   --   --   --  AST 15  --   --   --   --   --   --   --   --   --   --   ALT 10  --   --   --   --   --   --   --   --   --   --   ALKPHOS 58  --   --   --   --   --   --   --   --   --   --   BILITOT 0.8  --   --   --   --   --   --   --   --   --   --    < > = values in this interval not displayed.   ------------------------------------------------------------------------------------------------------------------ No results for input(s): CHOL, HDL, LDLCALC, TRIG, CHOLHDL, LDLDIRECT in the last 72 hours.  Lab Results  Component Value Date   HGBA1C 6.3 (H) 10/25/2019    ------------------------------------------------------------------------------------------------------------------ No results for input(s): TSH, T4TOTAL, T3FREE, THYROIDAB in the last 72 hours.  Invalid input(s): FREET3 ------------------------------------------------------------------------------------------------------------------ No results for input(s): VITAMINB12, FOLATE, FERRITIN, TIBC, IRON, RETICCTPCT in the last 72 hours.  Coagulation profile Recent Labs  Lab 12/22/19 1550  INR 1.1    No results for input(s): DDIMER in the last 72 hours.  Cardiac Enzymes No results for input(s): CKMB, TROPONINI, MYOGLOBIN in the last 168 hours.  Invalid input(s): CK ------------------------------------------------------------------------------------------------------------------ No results found for: BNP   Erick Blinks M.D on 12/28/2019 at 7:45 PM  Go to [email or web address] - for contact info  [facility] - Office  [phone]

## 2019-12-28 NOTE — TOC Progression Note (Signed)
Transition of Care [facility]) - Progression Note    Patient Details  Name: Michael Rivas MRN: [id number] Date of Birth: 1955/12/09  [facility]) CM/SW Contact  Karn Cassis, [place] Phone Number: 12/28/2019, 2:53 PM  Clinical Narrative:  Bed offer accepted at [facility]. LCSW started authorization through [facility] and spoke with Crystal.     Expected Discharge Plan: Home/Self Care Barriers to Discharge: Continued Medical Work up  Expected Discharge Plan and Services Expected Discharge Plan: Home/Self Care                                               Social Determinants of Health (SDOH) Interventions    Readmission Risk Interventions No flowsheet data found.

## 2019-12-28 NOTE — TOC Progression Note (Signed)
Transition of Care [facility]) - Progression Note    Patient Details  Name: Michael Rivas MRN: [id number] Date of Birth: 1956-05-05  Transition of Care [facility]) CM/SW Contact  Michael Rivas, [place] Phone Number: 12/28/2019, 1:11 PM  Clinical Narrative:  PT evaluated pt and recommend SNF. LCSW discussed placement process, including authorization with pt and pt's girlfriend at bedside. Pt agreeable and requests [facility]. Referral sent.      Expected Discharge Plan: Home/Self Care Barriers to Discharge: Continued Medical Work up  Expected Discharge Plan and Services Expected Discharge Plan: Home/Self Care                                               Social Determinants of Health (SDOH) Interventions    Readmission Risk Interventions No flowsheet data found.

## 2019-12-28 NOTE — Plan of Care (Signed)
  Problem: Acute Rehab PT Goals(only PT should resolve) Goal: Pt Will Go Supine/Side To Sit Outcome: Progressing Flowsheets (Taken 12/28/2019 1214) Pt will go Supine/Side to Sit: with supervision Goal: Pt Will Go Sit To Supine/Side Outcome: Progressing Flowsheets (Taken 12/28/2019 1214) Pt will go Sit to Supine/Side: with supervision Goal: Patient Will Transfer Sit To/From Stand Outcome: Progressing Flowsheets (Taken 12/28/2019 1214) Patient will transfer sit to/from stand: with minimal assist Goal: Pt Will Transfer Bed To Chair/Chair To Bed Outcome: Progressing Flowsheets (Taken 12/28/2019 1214) Pt will Transfer Bed to Chair/Chair to Bed: with min assist Goal: Pt Will Perform Standing Balance Or Pre-Gait Outcome: Progressing Flowsheets (Taken 12/28/2019 1214) Pt will perform standing balance or pre-gait: with minimal assist Goal: Pt Will Ambulate Outcome: Progressing Flowsheets (Taken 12/28/2019 1214) Pt will Ambulate: with moderate assist   12:15 PM, 12/28/19 Wyman Songster PT, DPT Physical Therapist at [facility]

## 2019-12-28 NOTE — [place] FL2 (Signed)
[place] MEDICAID FL2 LEVEL OF CARE SCREENING TOOL     IDENTIFICATION  Patient Name: Michael Rivas Birthdate: [birth year] Sex: male Admission Date (Current Location): 12/22/2019  [facility] and [place] Number:  [employer] and Address:  [facility],  [address], [place] [address]      Provider Number: [id number]  Attending Physician Name and Address:  Erick Blinks, MD  Relative Name and Phone Number:       Current Level of Care: Hospital Recommended Level of Care: Skilled Nursing Facility Prior Approval Number:    Date Approved/Denied:   PASRR Number: [id number]  Discharge Plan: SNF    Current Diagnoses: Patient Active Problem List   Diagnosis Date Noted  . Acute hyponatremia 12/22/2019  . Cholesteatoma 12/22/2019  . Chest pain due to myocardial ischemia   . Coronary artery disease of native artery of native heart with stable angina pectoris (HCC)   . CAD S/P percutaneous coronary angioplasty   . Prediabetes   . History of non-ST elevation myocardial infarction (NSTEMI) 10/25/2019  . Hyponatremia 10/25/2019  . Alcohol dependence (HCC) 10/25/2019  . Pulmonary nodule 10/25/2019  . Elevated troponin   . Angina pectoris (HCC)   . Essential hypertension, benign 03/02/2016  . Tobacco dependence 03/02/2016  . Depression 03/02/2016  . Chronic pain 03/02/2016    Orientation RESPIRATION BLADDER Height & Weight     Self, Situation, Place  Normal External catheter Weight: 136 lb 14.5 oz (62.1 kg) Height:  6' (182.9 cm)  BEHAVIORAL SYMPTOMS/MOOD NEUROLOGICAL BOWEL NUTRITION STATUS      Incontinent Diet(Regular (see d/c summary for updates))  AMBULATORY STATUS COMMUNICATION OF NEEDS Skin   Extensive Assist Verbally Normal                       Personal Care Assistance Level of Assistance  Bathing, Feeding, Dressing Bathing Assistance: Maximum assistance Feeding assistance: Limited assistance Dressing Assistance: Maximum  assistance     Functional Limitations Info  Hearing, Sight, Speech Sight Info: Impaired Hearing Info: Impaired Speech Info: Adequate    SPECIAL CARE FACTORS FREQUENCY  PT (By licensed PT)     PT Frequency: daily              Contractures      Additional Factors Info  Code Status, Allergies Code Status Info: Full code Allergies Info: NKDA           Current Medications (12/28/2019):  This is the current hospital active medication list Current Facility-Administered Medications  Medication Dose Route Frequency Provider Last Rate Last Admin  . 0.9 %  sodium chloride infusion   Intravenous Continuous Erick Blinks, MD 50 mL/hr at 12/27/19 1933 New Bag at 12/27/19 1933  . acetaminophen (TYLENOL) suppository 650 mg  650 mg Rectal Q4H PRN Bobette Mo, MD   650 mg at 12/25/19 0800  . aspirin EC tablet 81 mg  81 mg Oral Daily Levie Heritage, DO   81 mg at 12/28/19 8676  . atorvastatin (LIPITOR) tablet 80 mg  80 mg Oral q1800 Levie Heritage, DO   80 mg at 12/27/19 1732  . baclofen (LIORESAL) tablet 5 mg  5 mg Oral QID PRN Meredeth Ide, MD   5 mg at 12/27/19 0331  . Chlorhexidine Gluconate Cloth 2 % PADS 6 each  6 each Topical Daily Renda Rolls Z, DO   6 each at 12/28/19 [phone]  . enoxaparin (LOVENOX) injection 40 mg  40 mg Subcutaneous  QHS Truett Mainland, DO   40 mg at 12/27/19 2104  . folic acid (FOLVITE) tablet 1 mg  1 mg Oral Daily Truett Mainland, DO   1 mg at 12/28/19 6295  . HYDROcodone-acetaminophen (NORCO) 7.5-325 MG per tablet 1 tablet  1 tablet Oral Q6H PRN Truett Mainland, DO   1 tablet at 12/27/19 2103  . insulin aspart (novoLOG) injection 0-5 Units  0-5 Units Subcutaneous QHS Emokpae, Courage, MD      . insulin aspart (novoLOG) injection 0-5 Units  0-5 Units Subcutaneous QID Kathie Dike, MD      . labetalol (NORMODYNE) injection 10 mg  10 mg Intravenous Q4H PRN Denton Brick, Courage, MD   10 mg at 12/25/19 0705  . megestrol (MEGACE) tablet 40 mg  40 mg  Oral BID Truett Mainland, DO   40 mg at 12/28/19 2841  . metoprolol tartrate (LOPRESSOR) tablet 25 mg  25 mg Oral BID Kathie Dike, MD   25 mg at 12/28/19 0951  . multivitamin with minerals tablet 1 tablet  1 tablet Oral Daily Truett Mainland, DO   1 tablet at 12/28/19 3244  . ondansetron (ZOFRAN) tablet 4 mg  4 mg Oral Q6H PRN Truett Mainland, DO       Or  . ondansetron [facility]) injection 4 mg  4 mg Intravenous Q6H PRN Truett Mainland, DO   4 mg at 12/25/19 2217  . ondansetron (ZOFRAN-ODT) disintegrating tablet 4 mg  4 mg Oral TID PRN Truett Mainland, DO      . pantoprazole (PROTONIX) EC tablet 40 mg  40 mg Oral Daily Kathie Dike, MD   40 mg at 12/28/19 0954  . thiamine tablet 100 mg  100 mg Oral Daily Truett Mainland, DO   100 mg at 12/28/19 0102   Or  . thiamine (B-1) injection 100 mg  100 mg Intravenous Daily Truett Mainland, DO   100 mg at 12/27/19 0932  . ticagrelor (BRILINTA) tablet 90 mg  90 mg Oral BID Truett Mainland, DO   90 mg at 12/28/19 7253     Discharge Medications: Please see discharge summary for a list of discharge medications.  Relevant Imaging Results:  Relevant Lab Results:   Additional Information SSN: [id number]  Salome Arnt, LCSW

## 2019-12-28 NOTE — Care Management Important Message (Signed)
Important Message  Patient Details  Name: Elvin Mccartin MRN: [id number] Date of Birth: [birth year]   Medicare Important Message Given:  Yes     Corey Harold 12/28/2019, 3:54 PM

## 2019-12-28 NOTE — Progress Notes (Signed)
Pt is still on schedule for [facility] Called Dr Lucky Rathke office and spoke with Marliss Czar who will notify Dr Lucky Rathke scheduler to remove/reschedule at a later date.

## 2019-12-29 ENCOUNTER — Other Ambulatory Visit (HOSPITAL_COMMUNITY): Payer: PPO

## 2019-12-29 ENCOUNTER — Inpatient Hospital Stay (HOSPITAL_COMMUNITY): Payer: PPO

## 2019-12-29 LAB — BASIC METABOLIC PANEL
Anion gap: 6 (ref 5–15)
BUN: 13 mg/dL (ref 8–23)
CO2: 21 mmol/L — ABNORMAL LOW (ref 22–32)
Calcium: 8.5 mg/dL — ABNORMAL LOW (ref 8.9–10.3)
Chloride: 99 mmol/L (ref 98–111)
Creatinine, Ser: 0.68 mg/dL (ref 0.61–1.24)
GFR calc Af Amer: >60 mL/min (ref >60)
GFR calc non Af Amer: >60 mL/min (ref >60)
Glucose, Bld: 113 mg/dL — ABNORMAL HIGH (ref 70–99)
Potassium: 3.4 mmol/L — ABNORMAL LOW (ref 3.5–5.1)
Sodium: 126 mmol/L — ABNORMAL LOW (ref 135–145)

## 2019-12-29 LAB — CBC
HCT: 30.1 % — ABNORMAL LOW (ref 39.0–52.0)
Hemoglobin: 10.1 g/dL — ABNORMAL LOW (ref 13.0–17.0)
MCH: 27.1 pg (ref 26.0–34.0)
MCHC: 33.6 g/dL (ref 30.0–36.0)
MCV: 80.7 fL (ref 80.0–100.0)
Platelets: 488 10*3/uL — ABNORMAL HIGH (ref 150–400)
RBC: 3.73 MIL/uL — ABNORMAL LOW (ref 4.22–5.81)
RDW: 14.8 % (ref 11.5–15.5)
WBC: 9.7 10*3/uL (ref 4.0–10.5)
nRBC: 0 % (ref 0.0–0.2)

## 2019-12-29 LAB — GLUCOSE, CAPILLARY
Glucose-Capillary: 123 mg/dL — ABNORMAL HIGH (ref 70–99)
Glucose-Capillary: 123 mg/dL — ABNORMAL HIGH (ref 70–99)
Glucose-Capillary: 107 mg/dL — ABNORMAL HIGH (ref 70–99)

## 2019-12-29 LAB — HEMOGLOBIN A1C
Hgb A1c MFr Bld: 6.7 % — ABNORMAL HIGH (ref 4.8–5.6)
Mean Plasma Glucose: 145.59 mg/dL

## 2019-12-29 MED ORDER — POTASSIUM CHLORIDE CRYS ER 20 MEQ PO TBCR
40.0000 meq | EXTENDED_RELEASE_TABLET | Freq: Once | ORAL | Status: AC
  Administered 2019-12-29: 40 meq via ORAL
  Filled 2019-12-29: qty 2

## 2019-12-29 MED ORDER — FUROSEMIDE 10 MG/ML IJ SOLN
20.0000 mg | Freq: Once | INTRAMUSCULAR | Status: AC
  Administered 2019-12-29: 20 mg via INTRAVENOUS
  Filled 2019-12-29: qty 2

## 2019-12-29 MED ORDER — ACETAMINOPHEN 325 MG PO TABS
650.0000 mg | ORAL_TABLET | Freq: Four times a day (QID) | ORAL | Status: DC | PRN
  Administered 2019-12-29 – 2019-12-30 (×2): 650 mg via ORAL
  Filled 2019-12-29 (×2): qty 2

## 2019-12-29 MED ORDER — SODIUM CHLORIDE 1 G PO TABS
1.0000 g | ORAL_TABLET | Freq: Three times a day (TID) | ORAL | Status: DC
  Administered 2019-12-29 – 2019-12-31 (×7): 1 g via ORAL
  Filled 2019-12-29 (×7): qty 1

## 2019-12-29 MED ORDER — LOPERAMIDE HCL 2 MG PO CAPS
2.0000 mg | ORAL_CAPSULE | Freq: Once | ORAL | Status: AC
  Administered 2019-12-29: 2 mg via ORAL
  Filled 2019-12-29: qty 1

## 2019-12-29 NOTE — Progress Notes (Signed)
Patient Demographics:    Michael Rivas, is a 64 y.o. male, DOB - [birth year], HYW:[id number]  Admit date - 12/22/2019   Admitting Physician Erick Blinks, MD  Outpatient Primary MD for the patient is [name], MD  LOS - 7   Chief Complaint  Patient presents with  . Altered Mental Status        Subjective:    Michael Rivas reports continued loose stools overnight, although seems to be less frequent. He ate his entire breakfast today. Still feels very weak and has difficulty ambulating   Assessment  & Plan :    Principal Problem:   Acute hyponatremia Active Problems:   Essential hypertension, benign   History of non-ST elevation myocardial infarction (NSTEMI)   Alcohol dependence (HCC)   Coronary artery disease of native artery of native heart with stable angina pectoris (HCC)   Prediabetes   Cholesteatoma  Brief Summary -64 y.o. male with a history of CAD s/p recent NSTEMI, (10/2019), HTN, glucose intolerance, alcohol and tobacco abuse admitted on 12/22/2019 with hyponatremia with sodium around 120   A/p 1)Persistent Hyponatremia--- SIADH Versus beer Potomania--informal over the latter as patient's the week preceding admission patient's beer intake was very minimal--- and [name]  -serial sodium noted to be in the range of 119 (patient sodium usually runs between 127 and 130) -Hydrated aggressively and hyponatremia persist -Urine osmolarity is 813 -Urine sodium is 140 and TSH is 2.7 -Discussed with Dr. Kathrene Bongo and Dr Ronalee Belts ( nephrologist)  -Sodium is up to 127 -Okay to give sodium chloride tablets 1 g twice daily  2)Acute metabolic encephalopathy--suspect due to #1 above -MRI brain negative for acute infarct -ABG did not show any acidosis -avoid further sedatives like ativan. -mental status appears  to be back to baseline  3)Glucose intolerance--- A1c was 6.3 on 10/25/2019 - allow some permissive hyperglycemia due to altered mentation with no oral intake at this time  -use Novolog/Humalog Sliding scale insulin with Accu-Cheks/Fingersticks as ordered   4)Febrile illness--WBC 13.8, blood cultures NGTD chest x-ray unremarkable , UA does not show signs of infection -Chest x-ray without definite pneumonia ?atelectasis -discontinue further antibiotics  5)H/o CAD--s/p NSTEMI 10/2019-- s/p LHC: "Partially successful angioplasty LCx. Residual calcific 50-60% stenosis, which will need atherectomy at a staged procedure. Also has severe RCA disease. Continue medical management   with Aspirin, metoprolol and Lipitor -stable on oral lopressor  6)Etoh Abuse--no signs of alcohol withdrawal. His girlfriend reports that last drink of alcohol almost 1 week prior to admission. Continue to monitor.  7)HTN-2 uncontrolled -Currently on Lopressor and amlodipine  8)Left side middle ear and mastoid cholesteatoma - Followed by ENT (Dr. Serena Colonel) who recommended tympanomastoidectomy back in 06/21/19; he has been unable to complete this surgery d/t sevral cancellations d/t low sodium levels  9) diarrhea.  C. difficile negative negative.  Use Imodium as needed.  -Patient From: home D/C Place: Skilled nursing facility Barriers: Not Clinically Stable-patient still having some loose stools.  These improve over the next 24 hours, anticipate discharge to skilled facility in a.m.  Code Status : full  Family Communication:   Discussed with his live-in girlfriend [name] at bedside  Consults  :  --Curbside consultation with nephrology service  DVT Prophylaxis  :  Lovenox -  - SCDs  Lab Results  Component Value Date   PLT 488 (H) 12/29/2019    Inpatient Medications  Scheduled Meds: . amLODipine  5 mg Oral Daily  . aspirin EC  81 mg Oral Daily  . atorvastatin  80 mg Oral q1800  . Chlorhexidine  Gluconate Cloth  6 each Topical Daily  . enoxaparin (LOVENOX) injection  40 mg Subcutaneous QHS  . folic acid  1 mg Oral Daily  . insulin aspart  0-5 Units Subcutaneous QHS  . insulin aspart  0-9 Units Subcutaneous TID WC  . megestrol  40 mg Oral BID  . metoprolol tartrate  25 mg Oral BID  . multivitamin with minerals  1 tablet Oral Daily  . pantoprazole  40 mg Oral Daily  . sodium chloride  1 g Oral TID WC  . thiamine  100 mg Oral Daily   Or  . thiamine  100 mg Intravenous Daily  . ticagrelor  90 mg Oral BID   Continuous Infusions:  PRN Meds:.acetaminophen, acetaminophen, baclofen, HYDROcodone-acetaminophen, labetalol, ondansetron **OR** ondansetron (ZOFRAN) IV, ondansetron    Anti-infectives (From admission, onward)   Start     Dose/Rate Route Frequency Ordered Stop   12/26/19 1200  cefTRIAXone (ROCEPHIN) 1 g in sodium chloride 0.9 % 100 mL IVPB  Status:  Discontinued     1 g 200 mL/hr over 30 Minutes Intravenous Every 24 hours 12/26/19 0955 12/27/19 1909   12/23/19 0400  piperacillin-tazobactam (ZOSYN) IVPB 3.375 g  Status:  Discontinued     3.375 g 12.5 mL/hr over 240 Minutes Intravenous Every 8 hours 12/23/19 0325 12/26/19 0955        Objective:   Vitals:   12/29/19 1219 12/29/19 1251 12/29/19 1521 12/29/19 1954  BP: 137/76  (!) 146/71   Pulse: 74  79   Resp: 20  18   Temp:  99.1 F (37.3 C) 100.1 F (37.8 C)   TempSrc:  Oral Oral   SpO2: 100%  99% 99%  Weight:      Height:        Wt Readings from Last 3 Encounters:  12/29/19 64.2 kg  12/17/19 68 kg  11/07/19 68.9 kg     Intake/Output Summary (Last 24 hours) at 12/29/2019 1958 Last data filed at 12/29/2019 1200 Gross per 24 hour  Intake 350 ml  Output --  Net 350 ml     Physical Exam  General exam: Alert, awake, oriented x 3 Respiratory system: Clear to auscultation. Respiratory effort normal. Cardiovascular system:RRR. No murmurs, rubs, gallops. Gastrointestinal system: Abdomen is nondistended,  soft and nontender. No organomegaly or masses felt. Normal bowel sounds heard. Central nervous system: Alert and oriented. No focal neurological deficits. Extremities: No C/C/E, +pedal pulses Skin: No rashes, lesions or ulcers Psychiatry: Judgement and insight appear normal. Mood & affect appropriate.      Data Review:   Micro Results Recent Results (from the past 240 hour(s))  SARS Coronavirus 2 by RT PCR (hospital order, performed in [facility] hospital lab) Nasopharyngeal Nasopharyngeal Swab     Status: None   Collection Time: 12/22/19  3:37 PM   Specimen: Nasopharyngeal Swab  Result Value Ref Range Status   SARS Coronavirus 2 NEGATIVE NEGATIVE Final    Comment: (NOTE) SARS-CoV-2 target nucleic acids are NOT DETECTED. The SARS-CoV-2 RNA is generally detectable in upper and lower respiratory specimens during the acute phase of  infection. The lowest concentration of SARS-CoV-2 viral copies this assay can detect is 250 copies / mL. A negative result does not preclude SARS-CoV-2 infection and should not be used as the sole basis for treatment or other patient management decisions.  A negative result may occur with improper specimen collection / handling, submission of specimen other than nasopharyngeal swab, presence of viral mutation(s) within the areas targeted by this assay, and inadequate number of viral copies (<250 copies / mL). A negative result must be combined with clinical observations, patient history, and epidemiological information. Fact Sheet for Patients:   StrictlyIdeas.no Fact Sheet for Healthcare Providers: BankingDealers.co.za This test is not yet approved or cleared  by the [place] FDA and has been authorized for detection and/or diagnosis of SARS-CoV-2 by FDA under an Emergency Use Authorization (EUA).  This EUA will remain in effect (meaning this test can be used) for the duration of the COVID-19  declaration under Section 564(b)(1) of the Act, 21 U.S.C. section 360bbb-3(b)(1), unless the authorization is terminated or revoked sooner. Performed at [facility], [address] [place], [place] [address]   MRSA PCR Screening     Status: None   Collection Time: 12/23/19  4:20 AM   Specimen: Nasal Mucosa; Nasopharyngeal  Result Value Ref Range Status   MRSA by PCR NEGATIVE NEGATIVE Final    Comment:        The GeneXpert MRSA Assay (FDA approved for NASAL specimens only), is one component of a comprehensive MRSA colonization surveillance program. It is not intended to diagnose MRSA infection nor to guide or monitor treatment for MRSA infections. Performed at [facility], [address], [place], [place] [address]   Culture, blood (routine x 2)     Status: None   Collection Time: 12/23/19  5:06 AM   Specimen: Left Antecubital; Blood  Result Value Ref Range Status   Specimen Description   Final    LEFT ANTECUBITAL BOTTLES DRAWN AEROBIC AND ANAEROBIC   Special Requests Blood Culture adequate volume  Final   Culture   Final    NO GROWTH 5 DAYS Performed at [facility], [address] [place], [place] [address]    Report Status 12/28/2019 FINAL  Final  Culture, blood (routine x 2)     Status: None   Collection Time: 12/23/19  6:03 AM   Specimen: Left Antecubital; Blood  Result Value Ref Range Status   Specimen Description   Final    LEFT ANTECUBITAL BOTTLES DRAWN AEROBIC AND ANAEROBIC   Special Requests Blood Culture adequate volume  Final   Culture   Final    NO GROWTH 5 DAYS Performed at [facility], [address] [place], [place] [address]    Report Status 12/28/2019 FINAL  Final  Urine Culture     Status: None   Collection Time: 12/25/19  7:28 PM   Specimen: Urine, Clean Catch  Result Value Ref Range Status   Specimen Description   Final    URINE, CLEAN CATCH Performed at [facility], [address] [place], [place] [address]    Special Requests   Final     Normal Performed at [facility], [address] [place], [place] [address]    Culture   Final    NO GROWTH Performed at [facility], [place] [address], [place],  [address]    Report Status 12/27/2019 FINAL  Final  C Difficile Quick Screen w PCR reflex     Status: None  Collection Time: 12/28/19 12:00 PM  Result Value Ref Range Status   C Diff antigen NEGATIVE NEGATIVE Final   C Diff toxin NEGATIVE NEGATIVE Final   C Diff interpretation No C. difficile detected.  Final    Comment: Performed at [facility], [address], [place], [place] [address]    Radiology Reports DG Chest 1 View  Result Date: 12/22/2019 CLINICAL DATA:  Headaches and dizziness EXAM: CHEST  1 VIEW COMPARISON:  10/24/2019 FINDINGS: The heart size and mediastinal contours are within normal limits. Both lungs are clear. The visualized skeletal structures are unremarkable. IMPRESSION: No active disease. Electronically Signed   By: Alcide Clever M.D.   On: 12/22/2019 15:43   CT HEAD WO CONTRAST  Result Date: 12/22/2019 CLINICAL DATA:  Headache, dizziness, altered level of consciousness EXAM: CT HEAD WITHOUT CONTRAST TECHNIQUE: Contiguous axial images were obtained from the base of the skull through the vertex without intravenous contrast. COMPARISON:  12/17/2019, 06/07/2019 FINDINGS: Brain: Evaluation is limited due to patient motion during the exam. No evidence of acute infarct or hemorrhage. The lateral ventricles and midline structures are grossly unremarkable. There are no acute extra-axial fluid collections. No mass effect. Vascular: No hyperdense vessel or unexpected calcification. Skull: Normal. Negative for fracture or focal lesion. Sinuses/Orbits: The destructive process centered in the left middle ear seen on prior CT head and temporal bone examinations is unchanged. Chronic opacification of the left mastoid air cells. The remainder of the paranasal sinuses and right mastoid air cells are unremarkable.  Other: None. IMPRESSION: 1. Limited study due to patient motion. 2. No acute infarct or hemorrhage. 3. Stable destructive process centered in the left middle ear, compatible with cholesteatoma reported on prior temporal bone CT 06/07/2019. Electronically Signed   By: Sharlet Salina M.D.   On: 12/22/2019 15:41   CT Head Wo Contrast  Result Date: 12/17/2019 CLINICAL DATA:  Ear pain and headache for several days, acutely worsening EXAM: CT HEAD WITHOUT CONTRAST TECHNIQUE: Contiguous axial images were obtained from the base of the skull through the vertex without intravenous contrast. COMPARISON:  Temporal bone CT 11 5 2020 FINDINGS: Brain: No evidence of acute infarction, hemorrhage, hydrocephalus, extra-axial collection or mass lesion/mass effect. Vascular: Atherosclerotic calcification of the carotid siphons. No hyperdense vessel. Skull: No calvarial fracture or suspicious osseous lesion. No scalp swelling or hematoma. Sinuses/Orbits: Extensive opacity seen throughout the left middle ear cavity with complete opacification of the left mastoid air cells and some hyperostotic changes favoring chronicity, with similar findings to the comparison CT temporal bone 06/08/2019. There is opacification of much of the attic of the middle ear cavity with destructive changes of the ossicular chain as well as progressive destructive changes along the left semi circular canals including a questionable focus of dehiscence along sigmoid plate. There is chronic dehiscence of the tympanic plate anteriorly along the posterior fossa the temporomandibular joint. The tegmen tympani appears grossly intact on these non tailored images. The right mastoid air cells and paranasal sinuses are predominantly clear. Included orbital structures are unremarkable. Other: None IMPRESSION: 1. No acute intracranial abnormality. 2. Extensive opacity throughout the left middle ear cavity compatible with patient's known cholesteatoma. 3. Progressive  destructive changes along the left ossicular chain. 4. Progressive destructive changes along the left semi circular canals including a questionable focus of dehiscence along the sigmoid plate. 5. Chronic dehiscence of the tympanic plate anteriorly along the posterior fossa the temporomandibular joint. 6. Consider dedicated temporal bone CT for better interrogation of the sigmoid  dehiscence. This study may be reconstructed from these images. Electronically Signed   By: Kreg Shropshire M.D.   On: 12/17/2019 22:10   MR BRAIN WO CONTRAST  Result Date: 12/26/2019 CLINICAL DATA:  Encephalopathy EXAM: MRI HEAD WITHOUT CONTRAST TECHNIQUE: Multiplanar, multiecho pulse sequences of the brain and surrounding structures were obtained without intravenous contrast. COMPARISON:  12/22/2019 head CT FINDINGS: The examination is severely degraded by motion and patient altered mental status. There is generalized volume loss. No midline shift or other significant mass effect. No hydrocephalus. No large territory infarct. IMPRESSION: 1. Severely motion degraded examination. 2. Within that limitation, no large territory infarct. Electronically Signed   By: Deatra Robinson M.D.   On: 12/26/2019 20:59   DG CHEST PORT 1 VIEW  Result Date: 12/29/2019 CLINICAL DATA:  Pt states he was admitted and has currently had a new onset fever. EXAM: PORTABLE CHEST 1 VIEW COMPARISON:  Chest radiograph 12/25/2019 FINDINGS: Stable cardiomediastinal contours with normal heart size. There is a bandlike opacity at the left base likely reflecting atelectasis. Small linear opacity in the right mid lung also likely atelectasis. No pneumothorax or significant pleural effusion. No acute finding in the visualized skeleton. IMPRESSION: Bandlike opacity at the left lung base likely reflecting atelectasis, early infection not excluded. Electronically Signed   By: Emmaline Kluver M.D.   On: 12/29/2019 15:51   DG CHEST PORT 1 VIEW  Result Date:  12/25/2019 CLINICAL DATA:  Fevers EXAM: PORTABLE CHEST 1 VIEW COMPARISON:  12/22/2019 FINDINGS: Cardiac shadow is stable. Mild aortic calcifications seen. The lungs are well aerated bilaterally. Minimal pleural fluid on the left is seen as well as new increased retrocardiac density likely representing atelectasis. No bony abnormality is noted. IMPRESSION: New left basilar atelectasis in the retrocardiac region with minimal pleural fluid. Electronically Signed   By: Alcide Clever M.D.   On: 12/25/2019 09:57     CBC Recent Labs  Lab 12/23/19 0020 12/26/19 0503 12/27/19 0344 12/29/19 1547  WBC 13.8* 15.8* 13.0* 9.7  HGB 11.8* 10.7* 10.3* 10.1*  HCT 33.8* 31.2* 29.8* 30.1*  PLT 329 416* 395 488*  MCV 78.4* 78.8* 80.1 80.7  MCH 27.4 27.0 27.7 27.1  MCHC 34.9 34.3 34.6 33.6  RDW 14.1 14.3 14.6 14.8    Chemistries  Recent Labs  Lab 12/26/19 0503 12/26/19 0838 12/26/19 1358 12/26/19 2135 12/27/19 0908 12/28/19 0317 12/29/19 0455  NA 123*   < > 125* 125* 127* 127* 126*  K 3.9   < > 3.8 3.7 3.8 3.6 3.4*  CL 92*   < > 93* 92* 96* 98 99  CO2 21*   < > 21* 21* 20* 19* 21*  GLUCOSE 126*   < > 131* 116* 103* 109* 113*  BUN 20   < > 21 22 26* 21 13  CREATININE 1.02   < > 1.04 0.99 0.96 0.80 0.68  CALCIUM 8.4*   < > 8.6* 8.5* 8.7* 8.2* 8.5*  MG 2.2  --   --   --   --   --   --    < > = values in this interval not displayed.   ------------------------------------------------------------------------------------------------------------------ No results for input(s): CHOL, HDL, LDLCALC, TRIG, CHOLHDL, LDLDIRECT in the last 72 hours.  Lab Results  Component Value Date   HGBA1C 6.7 (H) 12/28/2019   ------------------------------------------------------------------------------------------------------------------ No results for input(s): TSH, T4TOTAL, T3FREE, THYROIDAB in the last 72 hours.  Invalid input(s):  FREET3 ------------------------------------------------------------------------------------------------------------------ No results for input(s): VITAMINB12, FOLATE, FERRITIN, TIBC, IRON,  RETICCTPCT in the last 72 hours.  Coagulation profile No results for input(s): INR, PROTIME in the last 168 hours.  No results for input(s): DDIMER in the last 72 hours.  Cardiac Enzymes No results for input(s): CKMB, TROPONINI, MYOGLOBIN in the last 168 hours.  Invalid input(s): CK ------------------------------------------------------------------------------------------------------------------ No results found for: BNP   Erick Blinks M.D on 12/29/2019 at 7:58 PM  Go to [email or web address] - for contact info  [facility] - Office  [phone]

## 2019-12-29 NOTE — TOC Progression Note (Addendum)
Transition of Care [facility]) - Progression Note    Patient Details  Name: Michael Rivas MRN: [id number] Date of Birth: February 15, 1956  Transition of Care [facility]) CM/SW Contact  Annice Needy, LCSW Phone Number: 12/29/2019, 11:16 AM  Clinical Narrative:    UPDATE: Per attending, [facility] Medical Director will approve SNF.  Elizabeth with [facility]) advised that the Wellsite geologist said that case currently doesn't meet medical necessity for SNF admission based on physican statement that patient is not stable for discharge due to poor PO intake, continuing delirium and low sodium. Peer to Peer with Dr. Sudie Grumbling [phone] requested today. Message sent to attending.    Expected Discharge Plan: Home/Self Care Barriers to Discharge: Continued Medical Work up  Expected Discharge Plan and Services Expected Discharge Plan: Home/Self Care                                               Social Determinants of Health (SDOH) Interventions    Readmission Risk Interventions No flowsheet data found.

## 2019-12-29 NOTE — TOC Progression Note (Signed)
  Transition of Care [facility]) - Progression Note    Patient Details  Name: Michael Rivas MRN: [id number] Date of Birth: Dec 02, 1955  Transition of Care [facility]) CM/SW Contact  Annice Needy, LCSW Phone Number: 12/29/2019, 12:50 PM  Clinical Narrative:    [facility] authorized patient for SNF. Katha Hamming with [facility] patient is expected to discharge 12/30/19. Auth #[id number].   Expected Discharge Plan: Home/Self Care Barriers to Discharge: Continued Medical Work up  Expected Discharge Plan and Services Expected Discharge Plan: Home/Self Care                                               Social Determinants of Health (SDOH) Interventions    Readmission Risk Interventions No flowsheet data found.

## 2019-12-30 LAB — BASIC METABOLIC PANEL
Anion gap: 7 (ref 5–15)
BUN: 10 mg/dL (ref 8–23)
CO2: 21 mmol/L — ABNORMAL LOW (ref 22–32)
Calcium: 8.7 mg/dL — ABNORMAL LOW (ref 8.9–10.3)
Chloride: 100 mmol/L (ref 98–111)
Creatinine, Ser: 0.8 mg/dL (ref 0.61–1.24)
GFR calc Af Amer: >60 mL/min (ref >60)
GFR calc non Af Amer: >60 mL/min (ref >60)
Glucose, Bld: 114 mg/dL — ABNORMAL HIGH (ref 70–99)
Potassium: 3.9 mmol/L (ref 3.5–5.1)
Sodium: 128 mmol/L — ABNORMAL LOW (ref 135–145)

## 2019-12-30 LAB — SARS CORONAVIRUS 2 BY RT PCR (HOSPITAL ORDER, PERFORMED IN ~~LOC~~ HOSPITAL LAB): SARS Coronavirus 2: NEGATIVE

## 2019-12-30 LAB — GLUCOSE, CAPILLARY
Glucose-Capillary: 110 mg/dL — ABNORMAL HIGH (ref 70–99)
Glucose-Capillary: 118 mg/dL — ABNORMAL HIGH (ref 70–99)
Glucose-Capillary: 121 mg/dL — ABNORMAL HIGH (ref 70–99)
Glucose-Capillary: 111 mg/dL — ABNORMAL HIGH (ref 70–99)

## 2019-12-30 LAB — URINALYSIS, ROUTINE W REFLEX MICROSCOPIC
Bacteria, UA: NONE SEEN
Bilirubin Urine: NEGATIVE
Glucose, UA: NEGATIVE mg/dL
Ketones, ur: NEGATIVE mg/dL
Leukocytes,Ua: NEGATIVE
Nitrite: NEGATIVE
Protein, ur: NEGATIVE mg/dL
Specific Gravity, Urine: 1.012 (ref 1.005–1.030)
pH: 6 (ref 5.0–8.0)

## 2019-12-30 MED ORDER — ASPIRIN 81 MG PO TBEC: 81.0000 mg | DELAYED_RELEASE_TABLET | Freq: Every day | ORAL | 1 refills | Status: AC

## 2019-12-30 MED ORDER — HYDROCODONE-ACETAMINOPHEN 7.5-325 MG PO TABS: 1.0000 | ORAL_TABLET | Freq: Four times a day (QID) | ORAL | 0 refills | Status: DC | PRN

## 2019-12-30 MED ORDER — METOPROLOL TARTRATE 25 MG PO TABS: 25.0000 mg | ORAL_TABLET | Freq: Two times a day (BID) | ORAL | Status: DC

## 2019-12-30 MED ORDER — AMLODIPINE BESYLATE 5 MG PO TABS: 5.0000 mg | ORAL_TABLET | Freq: Every day | ORAL | Status: DC

## 2019-12-30 MED ORDER — SODIUM CHLORIDE 1 G PO TABS: 1.0000 g | ORAL_TABLET | Freq: Two times a day (BID) | ORAL | Status: DC

## 2019-12-30 NOTE — Plan of Care (Signed)
  Problem: Education: Goal: Knowledge of General Education information will improve Description: Including pain rating scale, medication(s)/side effects and non-pharmacologic comfort measures Outcome: Adequate for Discharge   Problem: Health Behavior/Discharge Planning: Goal: Ability to manage health-related needs will improve Outcome: Adequate for Discharge   Problem: Clinical Measurements: Goal: Ability to maintain clinical measurements within normal limits will improve Outcome: Adequate for Discharge Goal: Will remain free from infection Outcome: Adequate for Discharge Goal: Diagnostic test results will improve Outcome: Adequate for Discharge Goal: Respiratory complications will improve Outcome: Adequate for Discharge Goal: Cardiovascular complication will be avoided Outcome: Adequate for Discharge   Problem: Activity: Goal: Risk for activity intolerance will decrease Outcome: Adequate for Discharge   Problem: Nutrition: Goal: Adequate nutrition will be maintained Outcome: Adequate for Discharge   Problem: Coping: Goal: Level of anxiety will decrease Outcome: Adequate for Discharge   Problem: Elimination: Goal: Will not experience complications related to bowel motility Outcome: Adequate for Discharge Goal: Will not experience complications related to urinary retention Outcome: Adequate for Discharge   Problem: Pain Managment: Goal: General experience of comfort will improve Outcome: Adequate for Discharge   Problem: Safety: Goal: Ability to remain free from injury will improve Outcome: Adequate for Discharge   Problem: Skin Integrity: Goal: Risk for impaired skin integrity will decrease Outcome: Adequate for Discharge   Problem: Education: Goal: Knowledge of disease or condition will improve Outcome: Adequate for Discharge Goal: Understanding of discharge needs will improve Outcome: Adequate for Discharge   Problem: Health Behavior/Discharge  Planning: Goal: Ability to identify changes in lifestyle to reduce recurrence of condition will improve Outcome: Adequate for Discharge Goal: Identification of resources available to assist in meeting health care needs will improve Outcome: Adequate for Discharge   Problem: Physical Regulation: Goal: Complications related to the disease process, condition or treatment will be avoided or minimized Outcome: Adequate for Discharge   Problem: Safety: Goal: Ability to remain free from injury will improve Outcome: Adequate for Discharge   

## 2019-12-30 NOTE — Discharge Summary (Addendum)
Physician Discharge Summary  Kerrie Latour KVQ:[id number] DOB: 05-24-1956 DOA: 12/22/2019  PCP: [name], MD  Admit date: 12/22/2019 Discharge date: 12/31/2019  Admitted From: home Disposition:  SNF  Recommendations for Outpatient Follow-up:  1. Follow up with PCP in 1-2 weeks 2. Please obtain BMP/CBC in one week 3. Follow up with Dr. Pollyann Kennedy ENT as previously scheduled  Discharge Condition:stable CODE STATUS:full cde Diet recommendation: heart healthy  Brief/Interim Summary: 64 y/o male with history of CAD and recent NSTEMI (10/2019) HTN, alcohol abuse, admitted with hyponatremia, generalized weakness and encephalopathy.   Discharge Diagnoses:  Principal Problem:   Acute hyponatremia Active Problems:   Essential hypertension, benign   History of non-ST elevation myocardial infarction (NSTEMI)   Alcohol dependence (HCC)   Coronary artery disease of native artery of native heart with stable angina pectoris (HCC)   Prediabetes   Cholesteatoma  1)Persistent Hyponatremia--- SIADH Versus beer Potomania--informal over the latter as patient's the week preceding admission patient's beer intake was very minimal--- and [name]  -serial sodium noted to be in the range of 119 (patient sodium usually runs between 127 and 130) -Hydrated aggressively and hyponatremia persist -Urine osmolarity is 813 -Urine sodium is 140 and TSH is 2.7 -Discussed with Dr. Kathrene Bongo and Dr Ronalee Belts ( nephrologist)  -Sodium is up to 128 -Okay to give sodium chloride tablets 1 g twice daily  2)Acute metabolic encephalopathy--suspect due to #1 above -MRI brain negative for acute infarct -ABG did not show any acidosis -avoid further sedatives like ativan. -mental status appears to be back to baseline  3)Glucose intolerance--- A1c was 6.3 on 10/25/2019 - allow some permissive hyperglycemia due to altered mentation with no  oral intake at this time  -use Novolog/Humalog Sliding scale insulin with Accu-Cheks/Fingersticks as ordered   4)Febrile illness--WBC 13.8, blood cultures NGTD  -UA does not show signs of infection -Chest x-ray without definite pneumonia ?atelectasis -He does report having a productive cough -will prescribe a 5 day course of augmentin along with pulmonary hygiene  5)H/o CAD--s/p NSTEMI 10/2019-- s/p LHC: "Partially successful angioplasty LCx. Residual calcific 50-60% stenosis, which will need atherectomy at a staged procedure. Also has severe RCA disease. Continue medical management   with Aspirin, Brillinta, metoprolol and Lipitor -stable on oral lopressor  6)Etoh Abuse--no signs of alcohol withdrawal. His girlfriend reports that last drink of alcohol almost 1 week prior to admission. Continue to monitor.  7)HTN-2 uncontrolled -Currently on Lopressor and amlodipine -further titration as an outpatient  8)Left side middle ear and mastoid cholesteatoma - Followed by ENT (Dr. Serena Colonel) who recommended tympanomastoidectomy back in 06/21/19; he has been unable to complete this surgery d/t sevral cancellations d/t low sodium levels. -He should follow up with ENT as previously scheduled  9) Diarrhea.  C. difficile negative negative.  Use Imodium as needed. Diarrhea has resolved  10) Generalized weakness. Seen by physical therapy with recommendations for SNF  Discharge Instructions  Discharge Instructions    Diet - low sodium heart healthy   Complete by: As directed    Diet - low sodium heart healthy   Complete by: As directed    Increase activity slowly   Complete by: As directed    Increase activity slowly   Complete by: As directed      Allergies as of 12/31/2019   No Known Allergies     Medication List    STOP taking these medications  Chantix Starting Month Pak 0.5 MG X 11 & 1 MG X 42 tablet Generic drug: varenicline   lisinopril 10 MG tablet Commonly known  as: ZESTRIL   megestrol 40 MG tablet Commonly known as: MEGACE   metoprolol succinate 25 MG 24 hr tablet Commonly known as: TOPROL-XL   oxyCODONE-acetaminophen 5-325 MG tablet Commonly known as: PERCOCET/ROXICET     TAKE these medications   amLODipine 5 MG tablet Commonly known as: NORVASC Take 1 tablet (5 mg total) by mouth daily.   amoxicillin-clavulanate 875-125 MG tablet Commonly known as: AUGMENTIN Take 1 tablet by mouth every 12 (twelve) hours. For 5 days   aspirin 81 MG EC tablet Take 1 tablet (81 mg total) by mouth daily.   atorvastatin 80 MG tablet Commonly known as: LIPITOR Take 1 tablet (80 mg total) by mouth daily at 6 PM.   guaiFENesin 600 MG 12 hr tablet Commonly known as: MUCINEX Take 1 tablet (600 mg total) by mouth 2 (two) times daily.   HYDROcodone-acetaminophen 7.5-325 MG tablet Commonly known as: NORCO Take 1 tablet by mouth every 6 (six) hours as needed for moderate pain.   ipratropium-albuterol 0.5-2.5 (3) MG/3ML Soln Commonly known as: DUONEB Take 3 mLs by nebulization 4 (four) times daily.   ipratropium-albuterol 0.5-2.5 (3) MG/3ML Soln Commonly known as: DUONEB Take 3 mLs by nebulization every 6 (six) hours as needed.   metoprolol tartrate 25 MG tablet Commonly known as: LOPRESSOR Take 1 tablet (25 mg total) by mouth 2 (two) times daily.   nitroGLYCERIN 0.4 MG SL tablet Commonly known as: Nitrostat Place 1 tablet (0.4 mg total) under the tongue every 5 (five) minutes as needed for chest pain. Take for up to 3 doses. Call physician.   ondansetron 4 MG disintegrating tablet Commonly known as: ZOFRAN-ODT Take 4 mg by mouth 3 (three) times daily as needed for nausea or vomiting.   pantoprazole 20 MG tablet Commonly known as: PROTONIX Take 20 mg by mouth daily.   sodium chloride 1 g tablet Take 1 tablet (1 g total) by mouth 2 (two) times daily with a meal.   ticagrelor 90 MG Tabs tablet Commonly known as: BRILINTA Take 1 tablet (90  mg total) by mouth 2 (two) times daily.       No Known Allergies  Consultations:     Procedures/Studies: DG Chest 1 View  Result Date: 12/22/2019 CLINICAL DATA:  Headaches and dizziness EXAM: CHEST  1 VIEW COMPARISON:  10/24/2019 FINDINGS: The heart size and mediastinal contours are within normal limits. Both lungs are clear. The visualized skeletal structures are unremarkable. IMPRESSION: No active disease. Electronically Signed   By: Alcide Clever M.D.   On: 12/22/2019 15:43   CT HEAD WO CONTRAST  Result Date: 12/22/2019 CLINICAL DATA:  Headache, dizziness, altered level of consciousness EXAM: CT HEAD WITHOUT CONTRAST TECHNIQUE: Contiguous axial images were obtained from the base of the skull through the vertex without intravenous contrast. COMPARISON:  12/17/2019, 06/07/2019 FINDINGS: Brain: Evaluation is limited due to patient motion during the exam. No evidence of acute infarct or hemorrhage. The lateral ventricles and midline structures are grossly unremarkable. There are no acute extra-axial fluid collections. No mass effect. Vascular: No hyperdense vessel or unexpected calcification. Skull: Normal. Negative for fracture or focal lesion. Sinuses/Orbits: The destructive process centered in the left middle ear seen on prior CT head and temporal bone examinations is unchanged. Chronic opacification of the left mastoid air cells. The remainder of the paranasal sinuses and right mastoid air  cells are unremarkable. Other: None. IMPRESSION: 1. Limited study due to patient motion. 2. No acute infarct or hemorrhage. 3. Stable destructive process centered in the left middle ear, compatible with cholesteatoma reported on prior temporal bone CT 06/07/2019. Electronically Signed   By: Sharlet Salina M.D.   On: 12/22/2019 15:41   CT Head Wo Contrast  Result Date: 12/17/2019 CLINICAL DATA:  Ear pain and headache for several days, acutely worsening EXAM: CT HEAD WITHOUT CONTRAST TECHNIQUE: Contiguous  axial images were obtained from the base of the skull through the vertex without intravenous contrast. COMPARISON:  Temporal bone CT 11 5 2020 FINDINGS: Brain: No evidence of acute infarction, hemorrhage, hydrocephalus, extra-axial collection or mass lesion/mass effect. Vascular: Atherosclerotic calcification of the carotid siphons. No hyperdense vessel. Skull: No calvarial fracture or suspicious osseous lesion. No scalp swelling or hematoma. Sinuses/Orbits: Extensive opacity seen throughout the left middle ear cavity with complete opacification of the left mastoid air cells and some hyperostotic changes favoring chronicity, with similar findings to the comparison CT temporal bone 06/08/2019. There is opacification of much of the attic of the middle ear cavity with destructive changes of the ossicular chain as well as progressive destructive changes along the left semi circular canals including a questionable focus of dehiscence along sigmoid plate. There is chronic dehiscence of the tympanic plate anteriorly along the posterior fossa the temporomandibular joint. The tegmen tympani appears grossly intact on these non tailored images. The right mastoid air cells and paranasal sinuses are predominantly clear. Included orbital structures are unremarkable. Other: None IMPRESSION: 1. No acute intracranial abnormality. 2. Extensive opacity throughout the left middle ear cavity compatible with patient's known cholesteatoma. 3. Progressive destructive changes along the left ossicular chain. 4. Progressive destructive changes along the left semi circular canals including a questionable focus of dehiscence along the sigmoid plate. 5. Chronic dehiscence of the tympanic plate anteriorly along the posterior fossa the temporomandibular joint. 6. Consider dedicated temporal bone CT for better interrogation of the sigmoid dehiscence. This study may be reconstructed from these images. Electronically Signed   By: Kreg Shropshire M.D.    On: 12/17/2019 22:10   MR BRAIN WO CONTRAST  Result Date: 12/26/2019 CLINICAL DATA:  Encephalopathy EXAM: MRI HEAD WITHOUT CONTRAST TECHNIQUE: Multiplanar, multiecho pulse sequences of the brain and surrounding structures were obtained without intravenous contrast. COMPARISON:  12/22/2019 head CT FINDINGS: The examination is severely degraded by motion and patient altered mental status. There is generalized volume loss. No midline shift or other significant mass effect. No hydrocephalus. No large territory infarct. IMPRESSION: 1. Severely motion degraded examination. 2. Within that limitation, no large territory infarct. Electronically Signed   By: Deatra Robinson M.D.   On: 12/26/2019 20:59   DG CHEST PORT 1 VIEW  Result Date: 12/29/2019 CLINICAL DATA:  Pt states he was admitted and has currently had a new onset fever. EXAM: PORTABLE CHEST 1 VIEW COMPARISON:  Chest radiograph 12/25/2019 FINDINGS: Stable cardiomediastinal contours with normal heart size. There is a bandlike opacity at the left base likely reflecting atelectasis. Small linear opacity in the right mid lung also likely atelectasis. No pneumothorax or significant pleural effusion. No acute finding in the visualized skeleton. IMPRESSION: Bandlike opacity at the left lung base likely reflecting atelectasis, early infection not excluded. Electronically Signed   By: Emmaline Kluver M.D.   On: 12/29/2019 15:51   DG CHEST PORT 1 VIEW  Result Date: 12/25/2019 CLINICAL DATA:  Fevers EXAM: PORTABLE CHEST 1 VIEW COMPARISON:  12/22/2019 FINDINGS:  Cardiac shadow is stable. Mild aortic calcifications seen. The lungs are well aerated bilaterally. Minimal pleural fluid on the left is seen as well as new increased retrocardiac density likely representing atelectasis. No bony abnormality is noted. IMPRESSION: New left basilar atelectasis in the retrocardiac region with minimal pleural fluid. Electronically Signed   By: Inez Catalina M.D.   On: 12/25/2019  09:57      Subjective: Patient is awake and alert. Had only one bowel movement today and none overnight.  Discharge Exam: Vitals:   12/30/19 2010 12/30/19 2057 12/31/19 0030 12/31/19 0620  BP:  (!) 164/88 (!) 145/80 138/82  Pulse:  85 70 79  Resp:  18 18 15   Temp:  (!) 100.7 F (38.2 C) 97.7 F (36.5 C) 100.3 F (37.9 C)  TempSrc:  Oral Oral Oral  SpO2: 100% 99% 100% 99%  Weight:      Height:        General: Pt is alert, awake, not in acute distress Cardiovascular: RRR, S1/S2 +, no rubs, no gallops Respiratory: CTA bilaterally, no wheezing, no rhonchi Abdominal: Soft, NT, ND, bowel sounds + Extremities: no edema, no cyanosis    The results of significant diagnostics from this hospitalization (including imaging, microbiology, ancillary and laboratory) are listed below for reference.     Microbiology: Recent Results (from the past 240 hour(s))  SARS Coronavirus 2 by RT PCR (hospital order, performed in [facility] hospital lab) Nasopharyngeal Nasopharyngeal Swab     Status: None   Collection Time: 12/22/19  3:37 PM   Specimen: Nasopharyngeal Swab  Result Value Ref Range Status   SARS Coronavirus 2 NEGATIVE NEGATIVE Final    Comment: (NOTE) SARS-CoV-2 target nucleic acids are NOT DETECTED. The SARS-CoV-2 RNA is generally detectable in upper and lower respiratory specimens during the acute phase of infection. The lowest concentration of SARS-CoV-2 viral copies this assay can detect is 250 copies / mL. A negative result does not preclude SARS-CoV-2 infection and should not be used as the sole basis for treatment or other patient management decisions.  A negative result may occur with improper specimen collection / handling, submission of specimen other than nasopharyngeal swab, presence of viral mutation(s) within the areas targeted by this assay, and inadequate number of viral copies (<250 copies / mL). A negative result must be combined with clinical observations,  patient history, and epidemiological information. Fact Sheet for Patients:   StrictlyIdeas.no Fact Sheet for Healthcare Providers: BankingDealers.co.za This test is not yet approved or cleared  by the [place] FDA and has been authorized for detection and/or diagnosis of SARS-CoV-2 by FDA under an Emergency Use Authorization (EUA).  This EUA will remain in effect (meaning this test can be used) for the duration of the COVID-19 declaration under Section 564(b)(1) of the Act, 21 U.S.C. section 360bbb-3(b)(1), unless the authorization is terminated or revoked sooner. Performed at [facility], [address] [place], [place] [address]   MRSA PCR Screening     Status: None   Collection Time: 12/23/19  4:20 AM   Specimen: Nasal Mucosa; Nasopharyngeal  Result Value Ref Range Status   MRSA by PCR NEGATIVE NEGATIVE Final    Comment:        The GeneXpert MRSA Assay (FDA approved for NASAL specimens only), is one component of a comprehensive MRSA colonization surveillance program. It is not intended to diagnose MRSA infection nor to guide or monitor treatment for MRSA infections. Performed at [facility], [address], [place], [place] [address]  Culture, blood (routine x 2)     Status: None   Collection Time: 12/23/19  5:06 AM   Specimen: Left Antecubital; Blood  Result Value Ref Range Status   Specimen Description   Final    LEFT ANTECUBITAL BOTTLES DRAWN AEROBIC AND ANAEROBIC   Special Requests Blood Culture adequate volume  Final   Culture   Final    NO GROWTH 5 DAYS Performed at [facility], [address] [place], [place] [address]    Report Status 12/28/2019 FINAL  Final  Culture, blood (routine x 2)     Status: None   Collection Time: 12/23/19  6:03 AM   Specimen: Left Antecubital; Blood  Result Value Ref Range Status   Specimen Description   Final    LEFT ANTECUBITAL BOTTLES DRAWN AEROBIC AND ANAEROBIC   Special  Requests Blood Culture adequate volume  Final   Culture   Final    NO GROWTH 5 DAYS Performed at [facility], [address], [place], [place] [address]    Report Status 12/28/2019 FINAL  Final  Urine Culture     Status: None   Collection Time: 12/25/19  7:28 PM   Specimen: Urine, Clean Catch  Result Value Ref Range Status   Specimen Description   Final    URINE, CLEAN CATCH Performed at [facility], [address] [place], [place] [address]    Special Requests   Final    Normal Performed at [facility], [address] [place], [place] [address]    Culture   Final    NO GROWTH Performed at [facility], [address] [place], [place] [address]    Report Status 12/27/2019 FINAL  Final  C Difficile Quick Screen w PCR reflex     Status: None   Collection Time: 12/28/19 12:00 PM  Result Value Ref Range Status   C Diff antigen NEGATIVE NEGATIVE Final   C Diff toxin NEGATIVE NEGATIVE Final   C Diff interpretation No C. difficile detected.  Final    Comment: Performed at [facility], [address], [place], [place] [address]  SARS Coronavirus 2 by RT PCR (hospital order, performed in [facility] hospital lab) Nasopharyngeal Nasopharyngeal Swab     Status: None   Collection Time: 12/30/19  8:57 AM   Specimen: Nasopharyngeal Swab  Result Value Ref Range Status   SARS Coronavirus 2 NEGATIVE NEGATIVE Final    Comment: (NOTE) SARS-CoV-2 target nucleic acids are NOT DETECTED. The SARS-CoV-2 RNA is generally detectable in upper and lower respiratory specimens during the acute phase of infection. The lowest concentration of SARS-CoV-2 viral copies this assay can detect is 250 copies / mL. A negative result does not preclude SARS-CoV-2 infection and should not be used as the sole basis for treatment or other patient management decisions.  A negative result may occur with improper specimen collection / handling, submission of specimen other than nasopharyngeal swab, presence of  viral mutation(s) within the areas targeted by this assay, and inadequate number of viral copies (<250 copies / mL). A negative result must be combined with clinical observations, patient history, and epidemiological information. Fact Sheet for Patients:   BoilerBrush.com.cy Fact Sheet for Healthcare Providers: https://pope.com/ This test is not yet approved or cleared  by the [place] FDA and has been authorized for detection and/or diagnosis of SARS-CoV-2 by FDA under an Emergency Use Authorization (EUA).  This EUA will remain in effect (meaning this test can be used) for the duration  of the COVID-19 declaration under Section 564(b)(1) of the Act, 21 U.S.C. section 360bbb-3(b)(1), unless the authorization is terminated or revoked sooner. Performed at [facility], [address] [place], [place] [address]      Labs: BNP (last 3 results) No results for input(s): BNP in the last 8760 hours. Basic Metabolic Panel: Recent Labs  Lab 12/26/19 0503 12/26/19 6629 12/26/19 2135 12/27/19 0908 12/28/19 0317 12/29/19 0455 12/30/19 0638  NA 123*   < > 125* 127* 127* 126* 128*  K 3.9   < > 3.7 3.8 3.6 3.4* 3.9  CL 92*   < > 92* 96* 98 99 100  CO2 21*   < > 21* 20* 19* 21* 21*  GLUCOSE 126*   < > 116* 103* 109* 113* 114*  BUN 20   < > 22 26* 21 13 10   CREATININE 1.02   < > 0.99 0.96 0.80 0.68 0.80  CALCIUM 8.4*   < > 8.5* 8.7* 8.2* 8.5* 8.7*  MG 2.2  --   --   --   --   --   --    < > = values in this interval not displayed.   Liver Function Tests: No results for input(s): AST, ALT, ALKPHOS, BILITOT, PROT, ALBUMIN in the last 168 hours. No results for input(s): LIPASE, AMYLASE in the last 168 hours. No results for input(s): AMMONIA in the last 168 hours. CBC: Recent Labs  Lab 12/26/19 0503 12/27/19 0344 12/29/19 1547  WBC 15.8* 13.0* 9.7  HGB 10.7* 10.3* 10.1*  HCT 31.2* 29.8* 30.1*  MCV 78.8* 80.1 80.7  PLT 416* 395 488*    Cardiac Enzymes: No results for input(s): CKTOTAL, CKMB, CKMBINDEX, TROPONINI in the last 168 hours. BNP: Invalid input(s): POCBNP CBG: Recent Labs  Lab 12/30/19 1136 12/30/19 1643 12/30/19 2203 12/31/19 0748 12/31/19 1118  GLUCAP 118* 121* 111* 111* 133*   D-Dimer No results for input(s): DDIMER in the last 72 hours. Hgb A1c No results for input(s): HGBA1C in the last 72 hours. Lipid Profile No results for input(s): CHOL, HDL, LDLCALC, TRIG, CHOLHDL, LDLDIRECT in the last 72 hours. Thyroid function studies No results for input(s): TSH, T4TOTAL, T3FREE, THYROIDAB in the last 72 hours.  Invalid input(s): FREET3 Anemia work up No results for input(s): VITAMINB12, FOLATE, FERRITIN, TIBC, IRON, RETICCTPCT in the last 72 hours. Urinalysis    Component Value Date/Time   COLORURINE YELLOW 12/30/2019 0320   APPEARANCEUR CLEAR 12/30/2019 0320   LABSPEC 1.012 12/30/2019 0320   PHURINE 6.0 12/30/2019 0320   GLUCOSEU NEGATIVE 12/30/2019 0320   HGBUR SMALL (A) 12/30/2019 0320   BILIRUBINUR NEGATIVE 12/30/2019 0320   KETONESUR NEGATIVE 12/30/2019 0320   PROTEINUR NEGATIVE 12/30/2019 0320   UROBILINOGEN 0.2 11/05/2014 1025   NITRITE NEGATIVE 12/30/2019 0320   LEUKOCYTESUR NEGATIVE 12/30/2019 0320   Sepsis Labs Invalid input(s): PROCALCITONIN,  WBC,  LACTICIDVEN Microbiology Recent Results (from the past 240 hour(s))  SARS Coronavirus 2 by RT PCR (hospital order, performed in [facility] hospital lab) Nasopharyngeal Nasopharyngeal Swab     Status: None   Collection Time: 12/22/19  3:37 PM   Specimen: Nasopharyngeal Swab  Result Value Ref Range Status   SARS Coronavirus 2 NEGATIVE NEGATIVE Final    Comment: (NOTE) SARS-CoV-2 target nucleic acids are NOT DETECTED. The SARS-CoV-2 RNA is generally detectable in upper and lower respiratory specimens during the acute phase of infection. The lowest concentration of SARS-CoV-2 viral copies this assay can detect is 250 copies / mL.  A negative result does not preclude SARS-CoV-2 infection and should not be used as the sole basis for treatment or other patient management decisions.  A negative result may occur with improper specimen collection / handling, submission of specimen other than nasopharyngeal swab, presence of viral mutation(s) within the areas targeted by this assay, and inadequate number of viral copies (<250 copies / mL). A negative result must be combined with clinical observations, patient history, and epidemiological information. Fact Sheet for Patients:   BoilerBrush.com.cy Fact Sheet for Healthcare Providers: https://pope.com/ This test is not yet approved or cleared  by the [place] FDA and has been authorized for detection and/or diagnosis of SARS-CoV-2 by FDA under an Emergency Use Authorization (EUA).  This EUA will remain in effect (meaning this test can be used) for the duration of the COVID-19 declaration under Section 564(b)(1) of the Act, 21 U.S.C. section 360bbb-3(b)(1), unless the authorization is terminated or revoked sooner. Performed at [facility], [address], [place], [place] [address]   MRSA PCR Screening     Status: None   Collection Time: 12/23/19  4:20 AM   Specimen: Nasal Mucosa; Nasopharyngeal  Result Value Ref Range Status   MRSA by PCR NEGATIVE NEGATIVE Final    Comment:        The GeneXpert MRSA Assay (FDA approved for NASAL specimens only), is one component of a comprehensive MRSA colonization surveillance program. It is not intended to diagnose MRSA infection nor to guide or monitor treatment for MRSA infections. Performed at [facility], [address], [place], [place] [address]   Culture, blood (routine x 2)     Status: None   Collection Time: 12/23/19  5:06 AM   Specimen: Left Antecubital; Blood  Result Value Ref Range Status   Specimen Description   Final    LEFT ANTECUBITAL BOTTLES DRAWN AEROBIC  AND ANAEROBIC   Special Requests Blood Culture adequate volume  Final   Culture   Final    NO GROWTH 5 DAYS Performed at [facility], [address], [place], [place] [address]    Report Status 12/28/2019 FINAL  Final  Culture, blood (routine x 2)     Status: None   Collection Time: 12/23/19  6:03 AM   Specimen: Left Antecubital; Blood  Result Value Ref Range Status   Specimen Description   Final    LEFT ANTECUBITAL BOTTLES DRAWN AEROBIC AND ANAEROBIC   Special Requests Blood Culture adequate volume  Final   Culture   Final    NO GROWTH 5 DAYS Performed at [facility], [address] [place], [place] [address]    Report Status 12/28/2019 FINAL  Final  Urine Culture     Status: None   Collection Time: 12/25/19  7:28 PM   Specimen: Urine, Clean Catch  Result Value Ref Range Status   Specimen Description   Final    URINE, CLEAN CATCH Performed at [facility], [address], [place], [place] [address]    Special Requests   Final    Normal Performed at [facility], [address] [place], [place] [address]    Culture   Final    NO GROWTH Performed at [facility], [address] [place], [place] [address]    Report Status 12/27/2019 FINAL  Final  C Difficile Quick Screen w PCR reflex     Status: None   Collection Time: 12/28/19 12:00 PM  Result Value Ref Range Status   C Diff antigen NEGATIVE NEGATIVE  Final   C Diff toxin NEGATIVE NEGATIVE Final   C Diff interpretation No C. difficile detected.  Final    Comment: Performed at [facility], [address], [place], [place] [address]  SARS Coronavirus 2 by RT PCR (hospital order, performed in [facility] hospital lab) Nasopharyngeal Nasopharyngeal Swab     Status: None   Collection Time: 12/30/19  8:57 AM   Specimen: Nasopharyngeal Swab  Result Value Ref Range Status   SARS Coronavirus 2 NEGATIVE NEGATIVE Final    Comment: (NOTE) SARS-CoV-2 target nucleic acids are NOT DETECTED. The SARS-CoV-2 RNA is generally  detectable in upper and lower respiratory specimens during the acute phase of infection. The lowest concentration of SARS-CoV-2 viral copies this assay can detect is 250 copies / mL. A negative result does not preclude SARS-CoV-2 infection and should not be used as the sole basis for treatment or other patient management decisions.  A negative result may occur with improper specimen collection / handling, submission of specimen other than nasopharyngeal swab, presence of viral mutation(s) within the areas targeted by this assay, and inadequate number of viral copies (<250 copies / mL). A negative result must be combined with clinical observations, patient history, and epidemiological information. Fact Sheet for Patients:   BoilerBrush.com.cy Fact Sheet for Healthcare Providers: https://pope.com/ This test is not yet approved or cleared  by the [place] FDA and has been authorized for detection and/or diagnosis of SARS-CoV-2 by FDA under an Emergency Use Authorization (EUA).  This EUA will remain in effect (meaning this test can be used) for the duration of the COVID-19 declaration under Section 564(b)(1) of the Act, 21 U.S.C. section 360bbb-3(b)(1), unless the authorization is terminated or revoked sooner. Performed at [facility], [address], [place], [place] [address]      Time coordinating discharge:  SIGNED:   Erick Blinks, MD  [facility] 12/31/2019, 12:08 PM   If 7PM-7AM, please contact night-coverage [email or web address]

## 2019-12-30 NOTE — Progress Notes (Signed)
CM and MD made aware to nurse that patient's discharge delayed due to [facility] not being able to accept patient today. Patient's significant other in room and made aware and she is willing to transport patient tomorrow to [facility]- when they are able to accept patient. No questions or concerns from patient or significant other at this time.

## 2019-12-30 NOTE — TOC Progression Note (Signed)
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 Lanora Manis patient has 7 day authorization for [facility] [employer]. CSW called Debbie with [facility] to confirm patient can discharge to [facility] today. Per Eunice Blase, due to maintenance issues the patient will not have a room available until tomorrow. Hospitalist updated.  Expected Discharge Plan: Home/Self Care Barriers to Discharge: Continued Medical Work up  Expected Discharge Plan and Services Expected Discharge Plan: Home/Self Care Expected Discharge Date: 12/30/19                Readmission Risk Interventions No flowsheet data found.

## 2019-12-31 DIAGNOSIS — H7190 Unspecified cholesteatoma, unspecified ear: Secondary | ICD-10-CM | POA: Diagnosis not present

## 2019-12-31 DIAGNOSIS — R2681 Unsteadiness on feet: Secondary | ICD-10-CM | POA: Diagnosis not present

## 2019-12-31 DIAGNOSIS — Z743 Need for continuous supervision: Secondary | ICD-10-CM | POA: Diagnosis not present

## 2019-12-31 DIAGNOSIS — G510 Bell's palsy: Secondary | ICD-10-CM | POA: Diagnosis not present

## 2019-12-31 DIAGNOSIS — I252 Old myocardial infarction: Secondary | ICD-10-CM | POA: Diagnosis not present

## 2019-12-31 DIAGNOSIS — M6281 Muscle weakness (generalized): Secondary | ICD-10-CM | POA: Diagnosis not present

## 2019-12-31 DIAGNOSIS — R29810 Facial weakness: Secondary | ICD-10-CM | POA: Diagnosis present

## 2019-12-31 DIAGNOSIS — R7303 Prediabetes: Secondary | ICD-10-CM | POA: Diagnosis not present

## 2019-12-31 DIAGNOSIS — R197 Diarrhea, unspecified: Secondary | ICD-10-CM | POA: Diagnosis not present

## 2019-12-31 DIAGNOSIS — R69 Illness, unspecified: Secondary | ICD-10-CM | POA: Diagnosis not present

## 2019-12-31 DIAGNOSIS — R1312 Dysphagia, oropharyngeal phase: Secondary | ICD-10-CM | POA: Diagnosis not present

## 2019-12-31 DIAGNOSIS — I2510 Atherosclerotic heart disease of native coronary artery without angina pectoris: Secondary | ICD-10-CM | POA: Diagnosis not present

## 2019-12-31 DIAGNOSIS — Z7982 Long term (current) use of aspirin: Secondary | ICD-10-CM | POA: Diagnosis not present

## 2019-12-31 DIAGNOSIS — R5381 Other malaise: Secondary | ICD-10-CM | POA: Diagnosis not present

## 2019-12-31 DIAGNOSIS — I119 Hypertensive heart disease without heart failure: Secondary | ICD-10-CM | POA: Diagnosis not present

## 2019-12-31 DIAGNOSIS — W19XXXA Unspecified fall, initial encounter: Secondary | ICD-10-CM | POA: Diagnosis not present

## 2019-12-31 DIAGNOSIS — J449 Chronic obstructive pulmonary disease, unspecified: Secondary | ICD-10-CM | POA: Diagnosis not present

## 2019-12-31 DIAGNOSIS — I25118 Atherosclerotic heart disease of native coronary artery with other forms of angina pectoris: Secondary | ICD-10-CM | POA: Diagnosis not present

## 2019-12-31 DIAGNOSIS — R262 Difficulty in walking, not elsewhere classified: Secondary | ICD-10-CM | POA: Diagnosis not present

## 2019-12-31 DIAGNOSIS — R52 Pain, unspecified: Secondary | ICD-10-CM | POA: Diagnosis not present

## 2019-12-31 DIAGNOSIS — R519 Headache, unspecified: Secondary | ICD-10-CM | POA: Diagnosis not present

## 2019-12-31 DIAGNOSIS — H5710 Ocular pain, unspecified eye: Secondary | ICD-10-CM | POA: Diagnosis not present

## 2019-12-31 DIAGNOSIS — H7192 Unspecified cholesteatoma, left ear: Secondary | ICD-10-CM | POA: Diagnosis not present

## 2019-12-31 DIAGNOSIS — F17210 Nicotine dependence, cigarettes, uncomplicated: Secondary | ICD-10-CM | POA: Diagnosis not present

## 2019-12-31 DIAGNOSIS — Z7401 Bed confinement status: Secondary | ICD-10-CM | POA: Diagnosis not present

## 2019-12-31 DIAGNOSIS — F1010 Alcohol abuse, uncomplicated: Secondary | ICD-10-CM | POA: Diagnosis not present

## 2019-12-31 DIAGNOSIS — F1029 Alcohol dependence with unspecified alcohol-induced disorder: Secondary | ICD-10-CM | POA: Diagnosis not present

## 2019-12-31 DIAGNOSIS — H04122 Dry eye syndrome of left lacrimal gland: Secondary | ICD-10-CM | POA: Diagnosis not present

## 2019-12-31 DIAGNOSIS — I214 Non-ST elevation (NSTEMI) myocardial infarction: Secondary | ICD-10-CM | POA: Diagnosis not present

## 2019-12-31 DIAGNOSIS — Z79899 Other long term (current) drug therapy: Secondary | ICD-10-CM | POA: Diagnosis not present

## 2019-12-31 DIAGNOSIS — H7122 Cholesteatoma of mastoid, left ear: Secondary | ICD-10-CM | POA: Diagnosis not present

## 2019-12-31 DIAGNOSIS — I25119 Atherosclerotic heart disease of native coronary artery with unspecified angina pectoris: Secondary | ICD-10-CM | POA: Diagnosis not present

## 2019-12-31 DIAGNOSIS — D649 Anemia, unspecified: Secondary | ICD-10-CM | POA: Diagnosis not present

## 2019-12-31 DIAGNOSIS — R41841 Cognitive communication deficit: Secondary | ICD-10-CM | POA: Diagnosis not present

## 2019-12-31 DIAGNOSIS — I10 Essential (primary) hypertension: Secondary | ICD-10-CM | POA: Diagnosis not present

## 2019-12-31 DIAGNOSIS — E871 Hypo-osmolality and hyponatremia: Secondary | ICD-10-CM | POA: Diagnosis not present

## 2019-12-31 LAB — GLUCOSE, CAPILLARY
Glucose-Capillary: 111 mg/dL — ABNORMAL HIGH (ref 70–99)
Glucose-Capillary: 133 mg/dL — ABNORMAL HIGH (ref 70–99)

## 2019-12-31 MED ORDER — GUAIFENESIN ER 600 MG PO TB12
600.0000 mg | ORAL_TABLET | Freq: Two times a day (BID) | ORAL | Status: DC
  Administered 2019-12-31: 600 mg via ORAL
  Filled 2019-12-31: qty 1

## 2019-12-31 MED ORDER — IPRATROPIUM-ALBUTEROL 0.5-2.5 (3) MG/3ML IN SOLN: 3.0000 mL | Freq: Four times a day (QID) | RESPIRATORY_TRACT | Status: DC | PRN

## 2019-12-31 MED ORDER — IPRATROPIUM-ALBUTEROL 0.5-2.5 (3) MG/3ML IN SOLN: 3.0000 mL | Freq: Four times a day (QID) | RESPIRATORY_TRACT | Status: DC

## 2019-12-31 MED ORDER — AMOXICILLIN-POT CLAVULANATE 875-125 MG PO TABS
1.0000 | ORAL_TABLET | Freq: Two times a day (BID) | ORAL | Status: DC
  Administered 2019-12-31: 1 via ORAL
  Filled 2019-12-31: qty 1

## 2019-12-31 MED ORDER — AMOXICILLIN-POT CLAVULANATE 875-125 MG PO TABS: 1.0000 | ORAL_TABLET | Freq: Two times a day (BID) | ORAL | Status: DC

## 2019-12-31 MED ORDER — GUAIFENESIN ER 600 MG PO TB12: 600.0000 mg | ORAL_TABLET | Freq: Two times a day (BID) | ORAL | Status: DC

## 2019-12-31 NOTE — Progress Notes (Signed)
Report called to Silvio Pate, RN at [facility]. Reported that they are working on his room and it won't be ready until 2pm.

## 2019-12-31 NOTE — TOC Transition Note (Addendum)
Transition of Care [facility]) - CM/SW Discharge Note  Patient Details  Name: Michael Rivas MRN: [id number] Date of Birth: 20-Jul-1956  Transition of Care [facility]) CM/SW Contact:  Ewing Schlein, LCSW Phone Number: 12/31/2019, 11:05 AM  Clinical Narrative: CSW called Debbie with [facility] to confirm patient can discharge to SNF today. Debbie confirmed patient's room will be available this afternoon and the patient can be transported by his significant other, [name] Cautern, between 1:30-2pm. Patient will be in room B13 bed 2. Hospitalist and RN updated. Discharge summary, discharge orders, PT evaluation, and FL2 faxed to [facility] via [facility]. TOC signing off.  Final next level of care: Skilled Nursing Facility Barriers to Discharge: Barriers Resolved  Patient Goals and CMS Choice Patient states their goals for this hospitalization and ongoing recovery are:: Discharge to SNF CMS Medicare.gov Compare Post Acute Care list provided to:: Patient Choice offered to / list presented to : Patient  Discharge Placement PASRR number recieved: 12/28/19      Patient chooses bed at: Other - please specify in the comment section below:([facility]) Name of family member notified: Mariann Barter (significant other) PH: [phone] Patient and family notified of of transfer: 12/31/19  Readmission Risk Interventions No flowsheet data found.

## 2019-12-31 NOTE — Care Management Important Message (Signed)
Important Message  Patient Details  Name: Michael Rivas MRN: [id number] Date of Birth: 08-18-1955   Medicare Important Message Given:  Yes     Corey Harold 12/31/2019, 10:06 AM

## 2019-12-31 NOTE — Progress Notes (Signed)
Patient seen and examined.  Vitals overnight are stable.  He does report a productive cough.  Low-grade temperature noted overnight.  He is breathing comfortably on room air.  Hemodynamics are otherwise stable.  No further loose stools.  Please refer to discharge summary done on 5/30 for further details.  Discharge medications have been updated.  In addition to what is listed in discharge summary, will prescribe nebs, guaifenesin-5-day course of Augmentin to cover any component of pneumonia, although it may be related to atelectasis.  Encouraged incentive spirometry.  Plans for discharge to skilled facility today.  [employer]

## 2020-01-01 LAB — GLUCOSE, CAPILLARY: Glucose-Capillary: 114 mg/dL — ABNORMAL HIGH (ref 70–99)

## 2020-01-02 ENCOUNTER — Ambulatory Visit (HOSPITAL_BASED_OUTPATIENT_CLINIC_OR_DEPARTMENT_OTHER): Admission: RE | Admit: 2020-01-02 | Payer: PPO | Source: Home / Self Care | Admitting: Otolaryngology

## 2020-01-02 ENCOUNTER — Encounter (HOSPITAL_BASED_OUTPATIENT_CLINIC_OR_DEPARTMENT_OTHER): Admission: RE | Payer: Self-pay | Source: Home / Self Care

## 2020-01-02 SURGERY — TYMPANOMASTOIDECTOMY WITH RECONSTRUCTION
Anesthesia: General | Laterality: Left

## 2020-01-03 DIAGNOSIS — I214 Non-ST elevation (NSTEMI) myocardial infarction: Secondary | ICD-10-CM | POA: Diagnosis not present

## 2020-01-03 DIAGNOSIS — F1029 Alcohol dependence with unspecified alcohol-induced disorder: Secondary | ICD-10-CM | POA: Diagnosis not present

## 2020-01-03 DIAGNOSIS — E871 Hypo-osmolality and hyponatremia: Secondary | ICD-10-CM | POA: Diagnosis not present

## 2020-01-03 DIAGNOSIS — I10 Essential (primary) hypertension: Secondary | ICD-10-CM | POA: Diagnosis not present

## 2020-01-07 ENCOUNTER — Encounter (HOSPITAL_COMMUNITY): Payer: Self-pay

## 2020-01-07 ENCOUNTER — Emergency Department (HOSPITAL_COMMUNITY): Payer: PPO

## 2020-01-07 ENCOUNTER — Emergency Department (HOSPITAL_COMMUNITY)
Admission: EM | Admit: 2020-01-07 | Discharge: 2020-01-07 | Disposition: A | Discharge status: Home / Self Care | Payer: PPO | Attending: Emergency Medicine | Admitting: Emergency Medicine

## 2020-01-07 ENCOUNTER — Other Ambulatory Visit: Payer: Self-pay

## 2020-01-07 DIAGNOSIS — I25119 Atherosclerotic heart disease of native coronary artery with unspecified angina pectoris: Secondary | ICD-10-CM | POA: Insufficient documentation

## 2020-01-07 DIAGNOSIS — J449 Chronic obstructive pulmonary disease, unspecified: Secondary | ICD-10-CM | POA: Insufficient documentation

## 2020-01-07 DIAGNOSIS — G510 Bell's palsy: Secondary | ICD-10-CM | POA: Diagnosis not present

## 2020-01-07 DIAGNOSIS — H7192 Unspecified cholesteatoma, left ear: Secondary | ICD-10-CM | POA: Insufficient documentation

## 2020-01-07 DIAGNOSIS — F17210 Nicotine dependence, cigarettes, uncomplicated: Secondary | ICD-10-CM | POA: Diagnosis not present

## 2020-01-07 DIAGNOSIS — I119 Hypertensive heart disease without heart failure: Secondary | ICD-10-CM | POA: Diagnosis not present

## 2020-01-07 DIAGNOSIS — Z7982 Long term (current) use of aspirin: Secondary | ICD-10-CM | POA: Diagnosis not present

## 2020-01-07 DIAGNOSIS — R7303 Prediabetes: Secondary | ICD-10-CM | POA: Insufficient documentation

## 2020-01-07 DIAGNOSIS — R519 Headache, unspecified: Secondary | ICD-10-CM | POA: Diagnosis not present

## 2020-01-07 DIAGNOSIS — E871 Hypo-osmolality and hyponatremia: Secondary | ICD-10-CM | POA: Insufficient documentation

## 2020-01-07 DIAGNOSIS — I214 Non-ST elevation (NSTEMI) myocardial infarction: Secondary | ICD-10-CM | POA: Diagnosis not present

## 2020-01-07 DIAGNOSIS — Z79899 Other long term (current) drug therapy: Secondary | ICD-10-CM | POA: Diagnosis not present

## 2020-01-07 DIAGNOSIS — F1029 Alcohol dependence with unspecified alcohol-induced disorder: Secondary | ICD-10-CM | POA: Diagnosis not present

## 2020-01-07 DIAGNOSIS — I10 Essential (primary) hypertension: Secondary | ICD-10-CM | POA: Diagnosis not present

## 2020-01-07 DIAGNOSIS — H7190 Unspecified cholesteatoma, unspecified ear: Secondary | ICD-10-CM | POA: Diagnosis not present

## 2020-01-07 HISTORY — DX: Bell's palsy: G51.0

## 2020-01-07 LAB — CBC WITH DIFFERENTIAL/PLATELET
Abs Immature Granulocytes: 0.04 10*3/uL (ref 0.00–0.07)
Basophils Absolute: 0.1 10*3/uL (ref 0.0–0.1)
Basophils Relative: 1 %
Eosinophils Absolute: 0.2 10*3/uL (ref 0.0–0.5)
Eosinophils Relative: 2 %
HCT: 32.3 % — ABNORMAL LOW (ref 39.0–52.0)
Hemoglobin: 11.1 g/dL — ABNORMAL LOW (ref 13.0–17.0)
Immature Granulocytes: 1 %
Lymphocytes Relative: 26 %
Lymphs Abs: 1.9 10*3/uL (ref 0.7–4.0)
MCH: 27.8 pg (ref 26.0–34.0)
MCHC: 34.4 g/dL (ref 30.0–36.0)
MCV: 81 fL (ref 80.0–100.0)
Monocytes Absolute: 0.6 10*3/uL (ref 0.1–1.0)
Monocytes Relative: 9 %
Neutro Abs: 4.5 10*3/uL (ref 1.7–7.7)
Neutrophils Relative %: 61 %
Platelets: 522 10*3/uL — ABNORMAL HIGH (ref 150–400)
RBC: 3.99 MIL/uL — ABNORMAL LOW (ref 4.22–5.81)
RDW: 15.3 % (ref 11.5–15.5)
WBC: 7.3 10*3/uL (ref 4.0–10.5)
nRBC: 0 % (ref 0.0–0.2)

## 2020-01-07 LAB — COMPREHENSIVE METABOLIC PANEL
ALT: 14 U/L (ref 0–44)
AST: 13 U/L — ABNORMAL LOW (ref 15–41)
Albumin: 3 g/dL — ABNORMAL LOW (ref 3.5–5.0)
Alkaline Phosphatase: 60 U/L (ref 38–126)
Anion gap: 8 (ref 5–15)
BUN: 12 mg/dL (ref 8–23)
CO2: 21 mmol/L — ABNORMAL LOW (ref 22–32)
Calcium: 8.9 mg/dL (ref 8.9–10.3)
Chloride: 95 mmol/L — ABNORMAL LOW (ref 98–111)
Creatinine, Ser: 0.71 mg/dL (ref 0.61–1.24)
GFR calc Af Amer: >60 mL/min (ref >60)
GFR calc non Af Amer: >60 mL/min (ref >60)
Glucose, Bld: 110 mg/dL — ABNORMAL HIGH (ref 70–99)
Potassium: 4.4 mmol/L (ref 3.5–5.1)
Sodium: 124 mmol/L — ABNORMAL LOW (ref 135–145)
Total Bilirubin: 0.1 mg/dL — ABNORMAL LOW (ref 0.3–1.2)
Total Protein: 7.4 g/dL (ref 6.5–8.1)

## 2020-01-07 MED ORDER — PREDNISONE 50 MG PO TABS
60.0000 mg | ORAL_TABLET | Freq: Once | ORAL | Status: AC
  Administered 2020-01-07: 60 mg via ORAL
  Filled 2020-01-07: qty 1

## 2020-01-07 MED ORDER — PREDNISONE 10 MG PO TABS
60.0000 mg | ORAL_TABLET | Freq: Every day | ORAL | 0 refills | Status: AC
Start: 2020-01-07 — End: 2020-01-14

## 2020-01-07 NOTE — ED Notes (Signed)
Attempted twice to call report to [facility], no answer or voicemail.

## 2020-01-07 NOTE — ED Provider Notes (Signed)
[facility] EMERGENCY DEPARTMENT Provider Note   CSN: [id number] Arrival date & time: 01/07/20  1124     History Chief Complaint  Patient presents with  . Blurred Vision    Michael Rivas is a 64 y.o. male.  Patient presenting for left facial droop.  Patient states that it was clearly there yesterday.  He has had some ear discomfort for long period of time.  Is followed by Dr. Pollyann Kennedy for a cholesteatoma.  Was planning on operation on that but other medical admissions have got in the way and delayed that.  So surgery was canceled.        Past Medical History:  Diagnosis Date  . Coronary artery disease   . Heart attack (HCC) 10/25/2019  . Hypertension 2012  . Migraine   . NSTEMI (non-ST elevated myocardial infarction) [facility])     Patient Active Problem List   Diagnosis Date Noted  . Acute hyponatremia 12/22/2019  . Cholesteatoma 12/22/2019  . Chest pain due to myocardial ischemia   . Coronary artery disease of native artery of native heart with stable angina pectoris (HCC)   . CAD S/P percutaneous coronary angioplasty   . Prediabetes   . History of non-ST elevation myocardial infarction (NSTEMI) 10/25/2019  . Hyponatremia 10/25/2019  . Alcohol dependence (HCC) 10/25/2019  . Pulmonary nodule 10/25/2019  . Elevated troponin   . Angina pectoris (HCC)   . Essential hypertension, benign 03/02/2016  . Tobacco dependence 03/02/2016  . Depression 03/02/2016  . Chronic pain 03/02/2016    Past Surgical History:  Procedure Laterality Date  . BACK SURGERY  2016  . CHOLECYSTECTOMY  2007  . CORONARY ANGIOPLASTY  10/25/2019   CORONARY BALLOON ANGIOPLASTY  . CORONARY BALLOON ANGIOPLASTY N/A 10/25/2019   Procedure: CORONARY BALLOON ANGIOPLASTY;  Surgeon: [name], MD;  Location: [facility] INVASIVE CV LAB;  Service: Cardiovascular;  Laterality: N/A;  . INTRAVASCULAR PRESSURE WIRE/FFR STUDY N/A 10/25/2019   Procedure: INTRAVASCULAR PRESSURE WIRE/FFR STUDY;  Surgeon: [name], MD;  Location: [facility] INVASIVE CV LAB;  Service: Cardiovascular;  Laterality: N/A;  . INTRAVASCULAR ULTRASOUND/IVUS N/A 10/25/2019   Procedure: Intravascular Ultrasound/IVUS;  Surgeon: [name], MD;  Location: [facility] INVASIVE CV LAB;  Service: Cardiovascular;  Laterality: N/A;  . LEFT HEART CATH AND CORONARY ANGIOGRAPHY N/A 10/25/2019   Procedure: LEFT HEART CATH AND CORONARY ANGIOGRAPHY;  Surgeon: [name], MD;  Location: [facility] INVASIVE CV LAB;  Service: Cardiovascular;  Laterality: N/A;       Family History  Problem Relation Age of Onset  . Stroke Mother   . Diabetes Mother   . Hypertension Mother   . Heart failure Father   . Heart attack Father   . Diabetes Maternal Aunt   . Heart attack Maternal Aunt   . Stroke Maternal Uncle   . Stroke Paternal Uncle   . Stroke Maternal Uncle   . Heart attack Paternal Uncle     Social History   Tobacco Use  . Smoking status: Current Every Day Smoker    Packs/day: 0.50    Years: 45.00    Pack years: 22.50    Types: Cigars, Cigarettes  . Smokeless tobacco: Never Used  . Tobacco comment: smokes 2-3 cigars/daily.  former cigarette smoker , quit 2016, 0.5 ppd  Substance Use Topics  . Alcohol use: Yes    Alcohol/week: 35.0 standard drinks    Types: 35 Cans of beer per week    Comment: every other day  . Drug use:  No    Home Medications Prior to Admission medications   Medication Sig Start Date End Date Taking? Authorizing Provider  amLODipine (NORVASC) 5 MG tablet Take 1 tablet (5 mg total) by mouth daily. 12/31/19  Yes Erick Blinks, MD  aspirin 81 MG EC tablet Take 1 tablet (81 mg total) by mouth daily. 12/30/19 02/28/20 Yes Erick Blinks, MD  atorvastatin (LIPITOR) 80 MG tablet Take 1 tablet (80 mg total) by mouth daily at 6 PM. 11/19/19 01/18/20 Yes Patwardhan, Manish J, MD  Ensure (ENSURE) Take 237 mLs by mouth 2 (two) times daily between meals.   Yes [provider]  guaiFENesin (MUCINEX) 600 MG 12 hr  tablet Take 1 tablet (600 mg total) by mouth 2 (two) times daily. 12/31/19  Yes Erick Blinks, MD  HYDROcodone-acetaminophen (NORCO) 7.5-325 MG tablet Take 1 tablet by mouth every 6 (six) hours as needed for moderate pain. 12/30/19  Yes Erick Blinks, MD  ipratropium-albuterol (DUONEB) 0.5-2.5 (3) MG/3ML SOLN Take 3 mLs by nebulization every 6 (six) hours as needed. 12/31/19  Yes Erick Blinks, MD  metoprolol tartrate (LOPRESSOR) 25 MG tablet Take 1 tablet (25 mg total) by mouth 2 (two) times daily. 12/30/19  Yes Erick Blinks, MD  nitroGLYCERIN (NITROSTAT) 0.4 MG SL tablet Place 1 tablet (0.4 mg total) under the tongue every 5 (five) minutes as needed for chest pain. Take for up to 3 doses. Call physician. 10/26/19 10/25/20 Yes Ronaldo Miyamoto, Tyrone A, DO  ondansetron (ZOFRAN-ODT) 4 MG disintegrating tablet Take 4 mg by mouth 3 (three) times daily as needed for nausea or vomiting.  12/20/19  Yes [provider]  pantoprazole (PROTONIX) 20 MG tablet Take 20 mg by mouth daily. 12/20/19  Yes [provider]  sodium chloride 1 g tablet Take 1 tablet (1 g total) by mouth 2 (two) times daily with a meal. 12/30/19  Yes Erick Blinks, MD  ticagrelor (BRILINTA) 90 MG TABS tablet Take 1 tablet (90 mg total) by mouth 2 (two) times daily. 11/19/19 01/18/20 Yes Patwardhan, Manish J, MD  amoxicillin-clavulanate (AUGMENTIN) 875-125 MG tablet Take 1 tablet by mouth every 12 (twelve) hours. For 5 days Patient not taking: Reported on 01/07/2020 12/31/19   Erick Blinks, MD  ipratropium-albuterol (DUONEB) 0.5-2.5 (3) MG/3ML SOLN Take 3 mLs by nebulization 4 (four) times daily. Patient not taking: Reported on 01/07/2020 12/31/19   Erick Blinks, MD  predniSONE (DELTASONE) 10 MG tablet Take 6 tablets (60 mg total) by mouth daily for 7 days. 01/07/20 01/14/20  Vanetta Mulders, MD    Allergies    Patient has no known allergies.  Review of Systems   Review of Systems  Constitutional: Negative for chills and fever.   HENT: Positive for ear pain. Negative for rhinorrhea and sore throat.   Eyes: Negative for visual disturbance.  Respiratory: Negative for cough and shortness of breath.   Cardiovascular: Negative for chest pain and leg swelling.  Gastrointestinal: Negative for abdominal pain, diarrhea, nausea and vomiting.  Genitourinary: Negative for dysuria.  Musculoskeletal: Negative for back pain and neck pain.  Skin: Negative for rash.  Neurological: Positive for facial asymmetry. Negative for dizziness, speech difficulty, light-headedness and headaches.  Hematological: Does not bruise/bleed easily.  Psychiatric/Behavioral: Negative for confusion.    Physical Exam Updated Vital Signs BP 138/84   Pulse 67   Temp 98.4 F (36.9 C) (Oral)   Resp 16   Ht 1.829 m (6')   Wt 64 kg   SpO2 99%   BMI 19.14 kg/m  Physical Exam Vitals and nursing note reviewed.  Constitutional:      General: He is not in acute distress.    Appearance: He is well-developed.  HENT:     Head: Normocephalic and atraumatic.     Right Ear: Tympanic membrane normal.     Ears:     Comments: Left TM not well visualized.  Seems to be some abnormalities.  There is a little bit of liquid in the area. Eyes:     Extraocular Movements: Extraocular movements intact.     Conjunctiva/sclera: Conjunctivae normal.     Pupils: Pupils are equal, round, and reactive to light.     Comments: Patient able to close both eyes.  At rest left eye closes naturally.  Cardiovascular:     Rate and Rhythm: Normal rate and regular rhythm.     Heart sounds: No murmur.  Pulmonary:     Effort: Pulmonary effort is normal. No respiratory distress.     Breath sounds: Normal breath sounds.  Abdominal:     Palpations: Abdomen is soft.     Tenderness: There is no abdominal tenderness.  Musculoskeletal:        General: No swelling.     Cervical back: Normal range of motion and neck supple.  Skin:    General: Skin is warm and dry.  Neurological:       Mental Status: He is alert and oriented to person, place, and time.     Cranial Nerves: Cranial nerve deficit present.     Sensory: No sensory deficit.     Motor: No weakness.     Comments: Left facial droop involving the left forehead area.  Partial not complete.  Patient able to close eye okay.     ED Results / Procedures / Treatments   Labs (all labs ordered are listed, but only abnormal results are displayed) Labs Reviewed  CBC WITH DIFFERENTIAL/PLATELET - Abnormal; Notable for the following components:      Result Value   RBC 3.99 (*)    Hemoglobin 11.1 (*)    HCT 32.3 (*)    Platelets 522 (*)    All other components within normal limits  COMPREHENSIVE METABOLIC PANEL - Abnormal; Notable for the following components:   Sodium 124 (*)    Chloride 95 (*)    CO2 21 (*)    Glucose, Bld 110 (*)    Albumin 3.0 (*)    AST 13 (*)    Total Bilirubin 0.1 (*)    All other components within normal limits    EKG EKG Interpretation  Date/Time:  Monday January 07 2020 11:33:07 EDT Ventricular Rate:  74 PR Interval:    QRS Duration: 100 QT Interval:  388 QTC Calculation: 431 R Axis:   89 Text Interpretation: Sinus rhythm Borderline right axis deviation Probable anteroseptal infarct, old No significant change since Confirmed by Vanetta Mulders [phone]) on 01/07/2020 12:58:41 PM   Radiology CT Head Wo Contrast  Result Date: 01/07/2020 CLINICAL DATA:  Blurred vision. Pain. Recent fall. Left mough region weakness EXAM: CT HEAD WITHOUT CONTRAST TECHNIQUE: Contiguous axial images were obtained from the base of the skull through the vertex without intravenous contrast. COMPARISON:  Dec 22, 2019 head CT; CT of temporal bones June 07, 2019 FINDINGS: Brain: There is stable slight diffuse atrophy. There is no intracranial mass, hemorrhage, extra-axial fluid collection, or midline shift. There is relatively mild patchy small vessel disease in the centra semiovale bilaterally. Elsewhere brain  parenchyma appears unremarkable. No  acute infarct is evident. Vascular: No hyperdense vessel. There is calcification in the distal right vertebral artery and in each carotid siphon region. Skull: Bony calvarium appears intact. Sinuses/Orbits: A bony defect is noted in the medial left maxillary antrum, stable. There is slight mucosal thickening in several ethmoid air cells. Other visualized paranasal sinuses are clear. Orbits appear symmetric bilaterally. Other: Mastoids on the right are clear. There is extensive opacification of multiple left-sided ethmoid air cells. There is also opacification throughout the left middle ear region. There is a degree of destruction in this region. IMPRESSION: 1. Patchy periventricular small vessel disease. Mild diffuse atrophy. No acute infarct. No mass or hemorrhage. 2.  There are foci of arterial vascular calcification. 3. Extensive opacification of multiple ethmoid air cells on the left as well as fairly diffuse opacification in the left middle ear region. A degree of bony destruction is noted in the left middle ear region. Suspect cholesteatoma in the left middle ear region. Note that the appearance is essentially stable compared to prior temporal bone CT examination. 4.  Mild paranasal sinus disease. Electronically Signed   By: Lowella Grip III M.D.   On: 01/07/2020 14:26    Procedures Procedures (including critical care time)  Medications Ordered in ED Medications  predniSONE (DELTASONE) tablet 60 mg (has no administration in time range)    ED Course  I have reviewed the triage vital signs and the nursing notes.  Pertinent labs & imaging results that were available during my care of the patient were reviewed by me and considered in my medical decision making (see chart for details).    MDM Rules/Calculators/A&P                     Patient's findings are consistent with a Bell's palsy.  Does have involvement of the left forehead.  Feel has something to  do with the left ear cholesteatoma.  Which is known and Dr. Constance Holster was planning on operating on.  Patient's had some problems with chronic hyponatremia.  Today's 124.  He is on medication for this.  Do not feel that requires admission.  Will need close follow-up by nursing facility.  We will start him on prednisone 60 mg a day for 7 days.  First dose provided here today.  Head CT without any significant changes in the cholesteatoma from before.    Final Clinical Impression(s) / ED Diagnoses Final diagnoses:  Bell's palsy  Cholesteatoma of left ear  Hyponatremia    Rx / DC Orders ED Discharge Orders         Ordered    predniSONE (DELTASONE) 10 MG tablet  Daily     01/07/20 1612           Fredia Sorrow, MD 01/07/20 1615

## 2020-01-07 NOTE — ED Notes (Signed)
  Spoke to [place] from [facility] at [phone].  Staff reveal that pt has history of vertigo and is scheduled to have surgery to his left ear.  Shani reports that pts Na+ is lower and need to be evaluated.  She repots that pt sneezed and pt then complained of blurred vision to left eye and numbness.  Pt then reports to me that he when out to smoke last night and was not able to pull off his cigarette or able to drink any liquids.  This occurred last night around 1800 per pt.  EDP informed and will evaluate.

## 2020-01-07 NOTE — ED Triage Notes (Signed)
Pt brought EMS from [facility].Reported by EMS pt fell approx 1300 yesterday and hit left side of head. Pt has reported blurry vision in both eyes initially, now only in left Pt states that when he drinks water it comes out of mouth

## 2020-01-07 NOTE — Discharge Instructions (Signed)
Facial findings are consistent with Bell's palsy.  This is probably due to the extension of the cholesteatoma in the left ear.  Need to follow-up with Dr. Pollyann Kennedy from ear nose and throat.  Patient to take a course of prednisone for the next 7 days.  60 mg a day.  Prescription provided.  Also needs to have sodium follow closely.  Today it was 124.  Not in critical zone requiring admission.

## 2020-01-08 DIAGNOSIS — G510 Bell's palsy: Secondary | ICD-10-CM | POA: Diagnosis not present

## 2020-01-08 DIAGNOSIS — I214 Non-ST elevation (NSTEMI) myocardial infarction: Secondary | ICD-10-CM | POA: Diagnosis not present

## 2020-01-08 DIAGNOSIS — I25118 Atherosclerotic heart disease of native coronary artery with other forms of angina pectoris: Secondary | ICD-10-CM | POA: Diagnosis not present

## 2020-01-10 DIAGNOSIS — H04122 Dry eye syndrome of left lacrimal gland: Secondary | ICD-10-CM | POA: Diagnosis not present

## 2020-01-10 DIAGNOSIS — E871 Hypo-osmolality and hyponatremia: Secondary | ICD-10-CM | POA: Diagnosis not present

## 2020-01-10 DIAGNOSIS — G510 Bell's palsy: Secondary | ICD-10-CM | POA: Diagnosis not present

## 2020-01-14 DIAGNOSIS — G510 Bell's palsy: Secondary | ICD-10-CM | POA: Diagnosis not present

## 2020-01-14 DIAGNOSIS — H04122 Dry eye syndrome of left lacrimal gland: Secondary | ICD-10-CM | POA: Diagnosis not present

## 2020-01-17 ENCOUNTER — Telehealth: Payer: Self-pay

## 2020-01-17 DIAGNOSIS — I10 Essential (primary) hypertension: Secondary | ICD-10-CM | POA: Diagnosis not present

## 2020-01-17 DIAGNOSIS — F1029 Alcohol dependence with unspecified alcohol-induced disorder: Secondary | ICD-10-CM | POA: Diagnosis not present

## 2020-01-17 DIAGNOSIS — I214 Non-ST elevation (NSTEMI) myocardial infarction: Secondary | ICD-10-CM | POA: Diagnosis not present

## 2020-01-17 DIAGNOSIS — E871 Hypo-osmolality and hyponatremia: Secondary | ICD-10-CM | POA: Diagnosis not present

## 2020-01-17 NOTE — Telephone Encounter (Signed)
Michael Rivas called wanting medical clearance for pt faxed to [phone]. Has pending appt w/ MP 02/07/20.

## 2020-01-24 DIAGNOSIS — E871 Hypo-osmolality and hyponatremia: Secondary | ICD-10-CM | POA: Diagnosis not present

## 2020-01-24 DIAGNOSIS — H04122 Dry eye syndrome of left lacrimal gland: Secondary | ICD-10-CM | POA: Diagnosis not present

## 2020-01-24 DIAGNOSIS — I2510 Atherosclerotic heart disease of native coronary artery without angina pectoris: Secondary | ICD-10-CM | POA: Diagnosis not present

## 2020-01-24 DIAGNOSIS — I252 Old myocardial infarction: Secondary | ICD-10-CM | POA: Diagnosis not present

## 2020-01-24 DIAGNOSIS — Z7982 Long term (current) use of aspirin: Secondary | ICD-10-CM | POA: Diagnosis not present

## 2020-01-24 DIAGNOSIS — Z9181 History of falling: Secondary | ICD-10-CM | POA: Diagnosis not present

## 2020-01-24 DIAGNOSIS — Z720 Tobacco use: Secondary | ICD-10-CM | POA: Diagnosis not present

## 2020-01-24 DIAGNOSIS — K219 Gastro-esophageal reflux disease without esophagitis: Secondary | ICD-10-CM | POA: Diagnosis not present

## 2020-01-24 DIAGNOSIS — R7303 Prediabetes: Secondary | ICD-10-CM | POA: Diagnosis not present

## 2020-01-24 DIAGNOSIS — G510 Bell's palsy: Secondary | ICD-10-CM | POA: Diagnosis not present

## 2020-01-24 DIAGNOSIS — R1312 Dysphagia, oropharyngeal phase: Secondary | ICD-10-CM | POA: Diagnosis not present

## 2020-01-24 DIAGNOSIS — F1020 Alcohol dependence, uncomplicated: Secondary | ICD-10-CM | POA: Diagnosis not present

## 2020-01-24 DIAGNOSIS — I10 Essential (primary) hypertension: Secondary | ICD-10-CM | POA: Diagnosis not present

## 2020-01-24 DIAGNOSIS — H7122 Cholesteatoma of mastoid, left ear: Secondary | ICD-10-CM | POA: Diagnosis not present

## 2020-01-29 DIAGNOSIS — F1020 Alcohol dependence, uncomplicated: Secondary | ICD-10-CM | POA: Diagnosis not present

## 2020-01-29 DIAGNOSIS — Z7982 Long term (current) use of aspirin: Secondary | ICD-10-CM | POA: Diagnosis not present

## 2020-01-29 DIAGNOSIS — K219 Gastro-esophageal reflux disease without esophagitis: Secondary | ICD-10-CM | POA: Diagnosis not present

## 2020-01-29 DIAGNOSIS — I252 Old myocardial infarction: Secondary | ICD-10-CM | POA: Diagnosis not present

## 2020-01-29 DIAGNOSIS — H7122 Cholesteatoma of mastoid, left ear: Secondary | ICD-10-CM | POA: Diagnosis not present

## 2020-01-29 DIAGNOSIS — R7303 Prediabetes: Secondary | ICD-10-CM | POA: Diagnosis not present

## 2020-01-29 DIAGNOSIS — I10 Essential (primary) hypertension: Secondary | ICD-10-CM | POA: Diagnosis not present

## 2020-01-29 DIAGNOSIS — Z9181 History of falling: Secondary | ICD-10-CM | POA: Diagnosis not present

## 2020-01-29 DIAGNOSIS — E871 Hypo-osmolality and hyponatremia: Secondary | ICD-10-CM | POA: Diagnosis not present

## 2020-01-29 DIAGNOSIS — H04122 Dry eye syndrome of left lacrimal gland: Secondary | ICD-10-CM | POA: Diagnosis not present

## 2020-01-29 DIAGNOSIS — R1312 Dysphagia, oropharyngeal phase: Secondary | ICD-10-CM | POA: Diagnosis not present

## 2020-01-29 DIAGNOSIS — Z720 Tobacco use: Secondary | ICD-10-CM | POA: Diagnosis not present

## 2020-01-29 DIAGNOSIS — I2510 Atherosclerotic heart disease of native coronary artery without angina pectoris: Secondary | ICD-10-CM | POA: Diagnosis not present

## 2020-01-29 DIAGNOSIS — G510 Bell's palsy: Secondary | ICD-10-CM | POA: Diagnosis not present

## 2020-02-06 NOTE — Progress Notes (Signed)
Follow up visit  Subjective:   Michael Rivas, male    DOB: Jul 19, 1956, 64 y.o.   MRN: [id number]    HPI   Chief Complaint  Patient presents with  . Coronary Artery Disease  . Hyperlipidemia  . Follow-up    3 month   64 y.o. African American male with hypertension, hyperlipidemia, tobacco dependence, CAD, hyponatremia, cholesteatoma  Patient was recently diagnosed with cholesteatoma.  He was supposed to undergo surgery by Dr. Constance Rivas.  However, he had multiple medical admissions in the last few months which has delayed the surgery.  He was admitted in May 2021 with altered mental status due to acute hyponatremia, thought to be either due to SIADH or beer potomania.  He has also been in and out of the ED with otalgia blurred vision.  Patient is here today with his fiance.  He has had significant pain around left ear, along with pinkish discharge.  Ever since his hospitalization in June 2021, he has had severe generalized weakness.  He now requires walker for ambulation.  He has also had significant urinary incontinence, required indwelling Foley catheter for many days.  While he does not have Foley catheter any longer, he continues to have urinary incontinence.  He has urology appointment next month.  He has not seen his PCP since his hospitalization.  Current Outpatient Medications on File Prior to Visit  Medication Sig Dispense Refill  . amLODipine (NORVASC) 5 MG tablet Take 1 tablet (5 mg total) by mouth daily.    Marland Kitchen amoxicillin-clavulanate (AUGMENTIN) 875-125 MG tablet Take 1 tablet by mouth every 12 (twelve) hours. For 5 days (Patient not taking: Reported on 01/07/2020)    . aspirin 81 MG EC tablet Take 1 tablet (81 mg total) by mouth daily. 30 tablet 1  . atorvastatin (LIPITOR) 80 MG tablet Take 1 tablet (80 mg total) by mouth daily at 6 PM. 90 tablet 1  . Ensure (ENSURE) Take 237 mLs by mouth 2 (two) times daily between meals.    Marland Kitchen guaiFENesin (MUCINEX) 600 MG 12 hr tablet Take 1  tablet (600 mg total) by mouth 2 (two) times daily.    Marland Kitchen HYDROcodone-acetaminophen (NORCO) 7.5-325 MG tablet Take 1 tablet by mouth every 6 (six) hours as needed for moderate pain. 30 tablet 0  . ipratropium-albuterol (DUONEB) 0.5-2.5 (3) MG/3ML SOLN Take 3 mLs by nebulization 4 (four) times daily. (Patient not taking: Reported on 01/07/2020) 360 mL   . ipratropium-albuterol (DUONEB) 0.5-2.5 (3) MG/3ML SOLN Take 3 mLs by nebulization every 6 (six) hours as needed. 360 mL   . metoprolol tartrate (LOPRESSOR) 25 MG tablet Take 1 tablet (25 mg total) by mouth 2 (two) times daily.    . nitroGLYCERIN (NITROSTAT) 0.4 MG SL tablet Place 1 tablet (0.4 mg total) under the tongue every 5 (five) minutes as needed for chest pain. Take for up to 3 doses. Call physician. 100 tablet 3  . ondansetron (ZOFRAN-ODT) 4 MG disintegrating tablet Take 4 mg by mouth 3 (three) times daily as needed for nausea or vomiting.     . pantoprazole (PROTONIX) 20 MG tablet Take 20 mg by mouth daily.    . sodium chloride 1 g tablet Take 1 tablet (1 g total) by mouth 2 (two) times daily with a meal.     No current facility-administered medications on file prior to visit.    Cardiovascular & other pertient studies:  EKG 11/07/2019: Sinus rhythm 71 bpm. Old anterior infarct, occasional PVC.   Echocardiogram  10/25/2019: 1. Normal LV systolic function; grade 1 diastolic dysfunction.  2. Left ventricular ejection fraction, by estimation, is 60 to 65%. The  left ventricle has normal function. The left ventricle has no regional  wall motion abnormalities. Left ventricular diastolic parameters are  consistent with Grade I diastolic  dysfunction (impaired relaxation).  3. Right ventricular systolic function is normal. The right ventricular  size is normal.  4. The mitral valve is normal in structure. Trivial mitral valve  regurgitation. No evidence of mitral stenosis.  5. The aortic valve is tricuspid. Aortic valve regurgitation  is not  visualized. Mild aortic valve sclerosis is present, with no evidence of  aortic valve stenosis.  6. The inferior vena cava is normal in size with greater than 50%  respiratory variability, suggesting right atrial pressure of 3 mmHg.   Coronary angiogram/intervention 10/25/2019: LM: Distal 30%. dFR 1.0 LAD: Distal 30% stenosis Lcx: Severe tortuosity with moderate calcification.          Pros 60%. Mid 80-90% stenosis at LCx/OM         bifurcation ([place] 1,1,0) Ostial OM 40% stenosis.  RCA: Long diffuse prox-mid 80% stenosis.          Distal RCA 40% stenosis          PDA diffuse 60% stenosis.   Complex procedure due to severity or tortuosity and calcification. Partially successful cutting balloon angioplasty prox-mid LCx Residual prox 40%, mid 40% stenosis at LCx/Om bifurcation, 60% calcific stenosis distal to bifurcation. This lesion remains inadequately prepared for stenting due to severe calcification.   Recommendation: Staging the procedure with consideration for femoral access and atherectomy before stenting, followed by PCI to RCA. Continue DAPT with Aspirin and Brilinta.   Recent labs: 110/07/2019.71: Glucose >60, BUN/Cr 12/0.71. EGFR >60. Na/K 124/4.4. Albumin 3.0. Rest of the CMP normal H/H 11.1/32.3. MCV 81. Platelets 522 HbA1C 6.7%  Results for Michael, Rivas (MRN [id number]) as of 02/07/2020 09:50  Ref. Range 12/28/2019 03:17 12/29/2019 04:55 12/30/2019 06:38 01/07/2020 14:17  Sodium Latest Ref Range: 135 - 145 mmol/L 127 (L) 126 (L) 128 (L) 124 (L)    10/26/2019: Glucose 107, BUN/Cr 5/0.88. EGFR >60. Na/K 130/4.3. Rest of the CMP normal H/H 12/35. MCV 83. Platelets 272 HbA1C 6.3% Chol 140, TG 50, HDL 49, LDL 81 TSH 2.7 normal    Review of Systems  HENT: Positive for ear pain (Left, along with discharge).        Left facial droop, inability to close left eye  Cardiovascular: Negative for chest pain, dyspnea on exertion, leg swelling, palpitations and  syncope.         Vitals:   02/07/20 0939  BP: 116/78  Pulse: 79  Resp: 17  SpO2: 100%    Body mass index is 18.17 kg/m. Filed Weights   02/07/20 0939  Weight: 134 lb (60.8 kg)     Objective:   Physical Exam Vitals and nursing note reviewed.  Constitutional:      Appearance: He is well-developed.  Neck:     Vascular: No JVD.  Cardiovascular:     Rate and Rhythm: Normal rate and regular rhythm.     Pulses: Intact distal pulses.          Dorsalis pedis pulses are 0 on the right side and 0 on the left side.       Posterior tibial pulses are 1+ on the right side and 1+ on the left side.     Heart sounds: Normal heart sounds.  No murmur heard.   Pulmonary:     Effort: Pulmonary effort is normal.     Breath sounds: Normal breath sounds. No wheezing or rales.  Neurological:     Comments: Left facial droop  Left extraocular muscle weakness           Assessment & Recommendations:   64 y.o. African American male with hypertension, hyperlipidemia, tobacco dependence, CAD, hyponatremia, cholesteatoma  CAD: Partially successful LCx intervention (10/2019) No chest pain symptoms at this time.  Given his other, rather time sensitive issues such as cholesteatoma, no indication for further coronary intervention at this time.  We will reassess this after his cholesteatoma surgery. Continue dual antiplatelet therapy with aspirin and Brilinta, continue atorvastatin, metoprolol, lisinopril. Brilinta can be stopped 5 days before cholesteatoma surgery, and resume thereafter. While his cardiac risk remains elevated owing to his underlying coronary artery disease, benefits of surgery outweigh cardiac risks, which are not prohibitive.    Malnourishment:  He has severe generalized weakness, and malnourishment as suggested by albumin 3.0  Recommend seeing his PCP before his cholesteatoma surgery.  Encouraged him to increase his protein intake.  Hyponatremia: Last sodium 124.  He is  currently on sodium chloride tablets for possible SIADH versus beer potomania.  Fortunately, he has quit drinking now.  Recommend follow-up with PCP.  I will see him back in 3 months for cardiac follow-up.  Nigel Mormon, MD [facility] Pager: [phone] Office: [phone]

## 2020-02-07 ENCOUNTER — Other Ambulatory Visit: Payer: Self-pay

## 2020-02-07 ENCOUNTER — Encounter: Payer: Self-pay | Admitting: Cardiology

## 2020-02-07 ENCOUNTER — Ambulatory Visit: Payer: PPO | Admitting: Cardiology

## 2020-02-07 VITALS — BP 116/78 | HR 79 | Resp 17 | Ht 72.0 in | Wt 134.0 lb

## 2020-02-07 DIAGNOSIS — E871 Hypo-osmolality and hyponatremia: Secondary | ICD-10-CM | POA: Diagnosis not present

## 2020-02-07 DIAGNOSIS — I2510 Atherosclerotic heart disease of native coronary artery without angina pectoris: Secondary | ICD-10-CM | POA: Diagnosis not present

## 2020-02-07 DIAGNOSIS — E440 Moderate protein-calorie malnutrition: Secondary | ICD-10-CM

## 2020-02-11 DIAGNOSIS — I10 Essential (primary) hypertension: Secondary | ICD-10-CM | POA: Diagnosis not present

## 2020-02-11 DIAGNOSIS — R1312 Dysphagia, oropharyngeal phase: Secondary | ICD-10-CM | POA: Diagnosis not present

## 2020-02-11 DIAGNOSIS — Z7982 Long term (current) use of aspirin: Secondary | ICD-10-CM | POA: Diagnosis not present

## 2020-02-11 DIAGNOSIS — F1020 Alcohol dependence, uncomplicated: Secondary | ICD-10-CM | POA: Diagnosis not present

## 2020-02-11 DIAGNOSIS — E871 Hypo-osmolality and hyponatremia: Secondary | ICD-10-CM | POA: Diagnosis not present

## 2020-02-11 DIAGNOSIS — H04122 Dry eye syndrome of left lacrimal gland: Secondary | ICD-10-CM | POA: Diagnosis not present

## 2020-02-11 DIAGNOSIS — I252 Old myocardial infarction: Secondary | ICD-10-CM | POA: Diagnosis not present

## 2020-02-11 DIAGNOSIS — K219 Gastro-esophageal reflux disease without esophagitis: Secondary | ICD-10-CM | POA: Diagnosis not present

## 2020-02-11 DIAGNOSIS — Z9181 History of falling: Secondary | ICD-10-CM | POA: Diagnosis not present

## 2020-02-11 DIAGNOSIS — H7122 Cholesteatoma of mastoid, left ear: Secondary | ICD-10-CM | POA: Diagnosis not present

## 2020-02-11 DIAGNOSIS — G510 Bell's palsy: Secondary | ICD-10-CM | POA: Diagnosis not present

## 2020-02-11 DIAGNOSIS — R7303 Prediabetes: Secondary | ICD-10-CM | POA: Diagnosis not present

## 2020-02-11 DIAGNOSIS — I2510 Atherosclerotic heart disease of native coronary artery without angina pectoris: Secondary | ICD-10-CM | POA: Diagnosis not present

## 2020-02-11 DIAGNOSIS — Z720 Tobacco use: Secondary | ICD-10-CM | POA: Diagnosis not present

## 2020-02-13 DIAGNOSIS — G510 Bell's palsy: Secondary | ICD-10-CM | POA: Diagnosis not present

## 2020-02-13 DIAGNOSIS — F1020 Alcohol dependence, uncomplicated: Secondary | ICD-10-CM | POA: Diagnosis not present

## 2020-02-13 DIAGNOSIS — H04122 Dry eye syndrome of left lacrimal gland: Secondary | ICD-10-CM | POA: Diagnosis not present

## 2020-02-13 DIAGNOSIS — R1312 Dysphagia, oropharyngeal phase: Secondary | ICD-10-CM | POA: Diagnosis not present

## 2020-02-13 DIAGNOSIS — I252 Old myocardial infarction: Secondary | ICD-10-CM | POA: Diagnosis not present

## 2020-02-13 DIAGNOSIS — I2510 Atherosclerotic heart disease of native coronary artery without angina pectoris: Secondary | ICD-10-CM | POA: Diagnosis not present

## 2020-02-13 DIAGNOSIS — H7122 Cholesteatoma of mastoid, left ear: Secondary | ICD-10-CM | POA: Diagnosis not present

## 2020-02-13 DIAGNOSIS — R7303 Prediabetes: Secondary | ICD-10-CM | POA: Diagnosis not present

## 2020-02-13 DIAGNOSIS — K219 Gastro-esophageal reflux disease without esophagitis: Secondary | ICD-10-CM | POA: Diagnosis not present

## 2020-02-13 DIAGNOSIS — Z7982 Long term (current) use of aspirin: Secondary | ICD-10-CM | POA: Diagnosis not present

## 2020-02-13 DIAGNOSIS — E871 Hypo-osmolality and hyponatremia: Secondary | ICD-10-CM | POA: Diagnosis not present

## 2020-02-13 DIAGNOSIS — Z720 Tobacco use: Secondary | ICD-10-CM | POA: Diagnosis not present

## 2020-02-13 DIAGNOSIS — Z9181 History of falling: Secondary | ICD-10-CM | POA: Diagnosis not present

## 2020-02-13 DIAGNOSIS — I10 Essential (primary) hypertension: Secondary | ICD-10-CM | POA: Diagnosis not present

## 2020-02-15 NOTE — Progress Notes (Signed)
[facility] [address], [place] - [address] [place] [address] [place] [address] Phone: [phone] Fax: [phone]      Your procedure is scheduled on Wednesday 02/20/20.  Report to [facility] Main Entrance "A" at 09:15 A.M., and check in at the Admitting office.  Call this number if you have problems the morning of surgery:  [phone]  Call [phone] if you have any questions prior to your surgery date Monday-Friday 8am-4pm    Remember:  Do not eat after midnight the night before your surgery  You may drink clear liquids until 08:15am the morning of your surgery.   Clear liquids allowed are: Water, Non-Citrus Juices (without pulp), Carbonated Beverages, Clear Tea, Black Coffee Only, and Gatorade    Take these medicines the morning of surgery with A SIP OF WATER: Metoprolol Tartrate (Lopressor) Nitroglycerin (Nitrostat) - if needed  Follow your surgeon's instructions on when to stop Aspirin.  If no instructions were given by your surgeon then you will need to call the office to get those instructions.    Per Dr. Rosemary Holms, stop Ticagelor (Brilinta) 5 days prior to surgery.  As of today, STOP taking any Aspirin (unless otherwise instructed by your surgeon) or aspirin-containing products, Aleve, Naproxen, Ibuprofen, Motrin, Advil, Goody's, BC's, all herbal medications, fish oil, and all vitamins.                      Do not wear jewelry, make up, or nail polish            Do not wear lotions, powders, colognes, or deodorant.            Men may shave face and neck.            Do not bring valuables to the hospital.            [facility] is not responsible for any belongings or valuables.  Do NOT Smoke (Tobacco/Vaping) or drink Alcohol 24 hours prior to your procedure  If you use a CPAP at night, you may bring all equipment for your overnight stay.   Contacts, glasses, dentures or bridgework may not be worn into surgery.      For patients  admitted to the hospital, discharge time will be determined by your treatment team.   Patients discharged the day of surgery will not be allowed to drive home, and someone needs to stay with them for 24 hours.    Special instructions:   [facility]- Preparing For Surgery  Before surgery, you can play an important role. Because skin is not sterile, your skin needs to be as free of germs as possible. You can reduce the number of germs on your skin by washing with CHG (chlorahexidine gluconate) Soap before surgery.  CHG is an antiseptic cleaner which kills germs and bonds with the skin to continue killing germs even after washing.    Oral Hygiene is also important to reduce your risk of infection.  Remember - BRUSH YOUR TEETH THE MORNING OF SURGERY WITH YOUR REGULAR TOOTHPASTE  Please do not use if you have an allergy to CHG or antibacterial soaps. If your skin becomes reddened/irritated stop using the CHG.  Do not shave (including legs and underarms) for at least 48 hours prior to first CHG shower. It is OK to shave your face.  Please follow these instructions carefully.   1. Shower the NIGHT BEFORE SURGERY and the MORNING OF SURGERY with CHG Soap.  2. If you chose to wash your hair, wash your hair first as usual with your normal shampoo.  3. After you shampoo, rinse your hair and body thoroughly to remove the shampoo.  4. Use CHG as you would any other liquid soap. You can apply CHG directly to the skin and wash gently with a scrungie or a clean washcloth.   5. Apply the CHG Soap to your body ONLY FROM THE NECK DOWN.  Do not use on open wounds or open sores. Avoid contact with your eyes, ears, mouth and genitals (private parts). Wash Face and genitals (private parts)  with your normal soap.   6. Wash thoroughly, paying special attention to the area where your surgery will be performed.  7. Thoroughly rinse your body with warm water from the neck down.  8. DO NOT shower/wash with your  normal soap after using and rinsing off the CHG Soap.  9. Pat yourself dry with a CLEAN TOWEL.  10. Wear CLEAN PAJAMAS to bed the night before surgery  11. Place CLEAN SHEETS on your bed the night of your first shower and DO NOT SLEEP WITH PETS.   Day of Surgery: Shower with CHG soap as directed. Wear Clean/Comfortable clothing the morning of surgery Do not apply any deodorants/lotions.   Remember to brush your teeth WITH YOUR REGULAR TOOTHPASTE.   Please read over the following fact sheets that you were given.

## 2020-02-18 ENCOUNTER — Encounter (HOSPITAL_COMMUNITY)
Admission: RE | Admit: 2020-02-18 | Discharge: 2020-02-18 | Disposition: A | Discharge status: Home / Self Care | Payer: PPO | Source: Ambulatory Visit | Attending: Otolaryngology | Admitting: Otolaryngology

## 2020-02-18 ENCOUNTER — Other Ambulatory Visit: Payer: Self-pay

## 2020-02-18 ENCOUNTER — Other Ambulatory Visit (HOSPITAL_COMMUNITY)
Admission: RE | Admit: 2020-02-18 | Discharge: 2020-02-18 | Disposition: A | Discharge status: Home / Self Care | Payer: PPO | Source: Ambulatory Visit | Attending: Otolaryngology | Admitting: Otolaryngology

## 2020-02-18 ENCOUNTER — Encounter (HOSPITAL_COMMUNITY): Payer: Self-pay

## 2020-02-18 DIAGNOSIS — Z20822 Contact with and (suspected) exposure to covid-19: Secondary | ICD-10-CM | POA: Diagnosis not present

## 2020-02-18 DIAGNOSIS — Z01818 Encounter for other preprocedural examination: Secondary | ICD-10-CM | POA: Insufficient documentation

## 2020-02-18 HISTORY — DX: Solitary pulmonary nodule: R91.1

## 2020-02-18 HISTORY — DX: Impaired glucose tolerance (oral): R73.02

## 2020-02-18 LAB — BASIC METABOLIC PANEL
Anion gap: 8 (ref 5–15)
BUN: 9 mg/dL (ref 8–23)
CO2: 23 mmol/L (ref 22–32)
Calcium: 9.4 mg/dL (ref 8.9–10.3)
Chloride: 108 mmol/L (ref 98–111)
Creatinine, Ser: 0.94 mg/dL (ref 0.61–1.24)
GFR calc Af Amer: >60 mL/min (ref >60)
GFR calc non Af Amer: >60 mL/min (ref >60)
Glucose, Bld: 103 mg/dL — ABNORMAL HIGH (ref 70–99)
Potassium: 4.4 mmol/L (ref 3.5–5.1)
Sodium: 139 mmol/L (ref 135–145)

## 2020-02-18 LAB — CBC
HCT: 38.5 % — ABNORMAL LOW (ref 39.0–52.0)
Hemoglobin: 12 g/dL — ABNORMAL LOW (ref 13.0–17.0)
MCH: 27.5 pg (ref 26.0–34.0)
MCHC: 31.2 g/dL (ref 30.0–36.0)
MCV: 88.1 fL (ref 80.0–100.0)
Platelets: 272 10*3/uL (ref 150–400)
RBC: 4.37 MIL/uL (ref 4.22–5.81)
RDW: 18.8 % — ABNORMAL HIGH (ref 11.5–15.5)
WBC: 9.6 10*3/uL (ref 4.0–10.5)
nRBC: 0 % (ref 0.0–0.2)

## 2020-02-18 LAB — SARS CORONAVIRUS 2 (TAT 6-24 HRS): SARS Coronavirus 2: NEGATIVE

## 2020-02-18 NOTE — Progress Notes (Signed)
PCP - Dr. Nita Sells Cardiologist - Dr. Kathie Rhodes  PPM/ICD - denies  Chest x-ray - N/A EKG - 01/07/2020 Stress Test - denies ECHO - 10/25/2019 Cardiac Cath - 10/25/2019  Sleep Study - denies CPAP - N/A  DM: denies  Blood Thinner Instructions: Brilinta: per patient last dose was on 02/16/2020 Aspirin Instructions: Patient instructed to call surgeon's office today after PAT appointment for instructions on when to stop  ERAS Protcol -No  COVID TEST- Scheduled for today 02/18/2020 after PAT appointment. Patient verbalized understanding of self-quarantine instructions, appointment time and place.  Anesthesia review: YES, cardiac hx, CAD hx  Patient denies shortness of breath, fever, cough and chest pain at PAT appointment  All instructions explained to the patient, with a verbal understanding of the material. Patient agrees to go over the instructions while at home for a better understanding. Patient also instructed to self quarantine after being tested for COVID-19. The opportunity to ask questions was provided.

## 2020-02-19 ENCOUNTER — Encounter (HOSPITAL_COMMUNITY): Payer: Self-pay

## 2020-02-19 NOTE — H&P (Signed)
HPI:   Michael Rivas is a 64 y.o. male who presents as a consult Patient.   Referring Provider: Hall, John Zachariah II*  Chief complaint: Left ear problems.  HPI: For years he has had problems with pain and aching in the left ear. More recently he is having drainage from the ear including some bloody drainage. He was in the emergency department recently, over the weekend, recommendation was made for eardrops which she was unable to fill. He is a smoker. He does use Q-tips. Otherwise in good health. He does complain of hearing loss on that left side.  PMH/Meds/All/SocHx/FamHx/ROS:   Past Medical History:  Diagnosis Date  . Hypertension   Past Surgical History:  Procedure Laterality Date  . BACK SURGERY  . CHOLECYSTECTOMY   No family history of bleeding disorders, wound healing problems or difficulty with anesthesia.   Social History   Socioeconomic History  . Marital status: Single  Spouse name: Not on file  . Number of children: Not on file  . Years of education: Not on file  . Highest education level: Not on file  Occupational History  . Not on file  Social Needs  . Financial resource strain: Not on file  . Food insecurity  Worry: Not on file  Inability: Not on file  . Transportation needs  Medical: Not on file  Non-medical: Not on file  Tobacco Use  . Smoking status: Current Every Day Smoker  Packs/day: 0.50  . Smokeless tobacco: Never Used  Substance and Sexual Activity  . Alcohol use: Yes  . Drug use: Never  . Sexual activity: Not on file  Lifestyle  . Physical activity  Days per week: Not on file  Minutes per session: Not on file  . Stress: Not on file  Relationships  . Social connections  Talks on phone: Not on file  Gets together: Not on file  Attends religious service: Not on file  Active member of club or organization: Not on file  Attends meetings of clubs or organizations: Not on file  Relationship status: Not on file  Other Topics Concern   . Not on file  Social History Narrative  . Not on file   Current Outpatient Medications:  . baclofen (LIORESAL) 20 MG tablet, Take 20 mg by mouth., Disp: , Rfl:   A complete ROS was performed with pertinent positives/negatives noted in the HPI. The remainder of the ROS are negative.   Physical Exam:   BP 180/85  Pulse 75  Temp 97.3 F (36.3 C)  Ht 1.829 m (6')  Wt 72.6 kg (160 lb)  BMI 21.70 kg/m   General: Healthy and alert, in no distress, breathing easily. Normal affect. In a pleasant mood. Head: Normocephalic, atraumatic. No masses, or scars. Eyes: Pupils are equal, and reactive to light. Vision is grossly intact. No spontaneous or gaze nystagmus. Ears: Right ear canal, tympanic membrane and middle ear look normal to inspection. Left ear canal with mild skin edema, purulent exudate and thickening/erythema of the drum. Hearing: Tuning fork lateralizes to the left. Nose: Nasal cavities are clear with healthy mucosa, no polyps or exudate. Airways are patent. Face: No masses or scars, facial nerve function is symmetric. Oral Cavity: No mucosal abnormalities are noted. Tongue with normal mobility. Dentition appears healthy. Oropharynx: Tonsils are symmetric. There are no mucosal masses identified. Tongue base appears normal and healthy. Larynx/Hypopharynx: deferred Chest: Deferred Neck: No palpable masses, no cervical adenopathy, no thyroid nodules or enlargement. Neuro: Cranial nerves II-XII with normal   function. Balance: Normal gate. Other findings: none.  Independent Review of Additional Tests or Records:  none  Procedures:  Procedure note:  Indications: External otitis  Details of ear canal cleaning were discussed with the patient and all questions were answered.  Procedure:  Using the operating microscope, the left side was cleaned of mucopurulent exudate and debris using suction.   He tolerated this procedure well. There were no complications.  The  tympanic membrane is bulging and there appears to be a posterior perforation with bony debris inside, very concerning for cholesteatoma.  Impression & Plans:  Chronic draining ear with evidence of conductive hearing loss and probable cholesteatoma. Recommend strict water precautions. Recommend he try to stop smoking. Keep Q-tips out of his ears. Continue on antibiotic drops. I will schedule him for CT temporal bones to further evaluate the anatomy. We will need a hearing test at some point and then we will discuss possible surgical intervention.

## 2020-02-19 NOTE — Anesthesia Preprocedure Evaluation (Addendum)
Anesthesia Evaluation  Patient identified by MRN, date of birth, ID band Patient awake    Reviewed: Allergy & Precautions, NPO status , Patient's Chart, lab work & pertinent test results  History of Anesthesia Complications Negative for: history of anesthetic complications  Airway Mallampati: II  TM Distance: >3 FB Neck ROM: Full    Dental no notable dental hx. (+) Dental Advisory Given   Pulmonary neg pulmonary ROS, Current Smoker and Patient abstained from smoking.,    Pulmonary exam normal        Cardiovascular hypertension, + CAD, + Past MI and + Cardiac Stents  Normal cardiovascular exam  Echo 10/25/2019: IMPRESSIONS  1. Normal LV systolic function; grade 1 diastolic dysfunction.  2. Left ventricular ejection fraction, by estimation, is 60 to 65%. The  left ventricle has normal function. The left ventricle has no regional  wall motion abnormalities. Left ventricular diastolic parameters are  consistent with Grade I diastolic  dysfunction (impaired relaxation).  3. Right ventricular systolic function is normal. The right ventricular  size is normal.  4. The mitral valve is normal in structure. Trivial mitral valve  regurgitation. No evidence of mitral stenosis.  5. The aortic valve is tricuspid. Aortic valve regurgitation is not  visualized. Mild aortic valve sclerosis is present, with no evidence of  aortic valve stenosis.  6. The inferior vena cava is normal in size with greater than 50%  respiratory variability, suggesting right atrial pressure of 3 mmHg.   Cardiac cath 10/25/19: LM: Distal 30%. dFR 1.0 LAD: Distal 30% stenosis Lcx: Severe tortuosity with moderate calcification.  Pros 60%. Mid 80-90% stenosis at LCx/OM bifurcation ([place] 1,1,0) Ostial OM 40% stenosis.  RCA: Long diffuse prox-mid 80% stenosis. Distal RCA 40% stenosis PDA diffuse 60% stenosis.  - Complex  procedure due to severity or tortuosity and calcification. Partially successful cutting balloon angioplasty prox-mid LCx Residual prox 40%, mid 40% stenosis at LCx/Om bifurcation, 60% calcific stenosis distal to bifurcation. This lesion remains inadequately prepared for stenting due to severe calcification.  - Recommendation: Staging the procedure with consideration for femoral access and atherectomy before stenting, followed by PCI to RCA. Continue DAPT with Aspirin and Brilinta.    Neuro/Psych PSYCHIATRIC DISORDERS Depression negative neurological ROS     GI/Hepatic negative GI ROS, Neg liver ROS,   Endo/Other  negative endocrine ROS  Renal/GU negative Renal ROS     Musculoskeletal negative musculoskeletal ROS (+)   Abdominal   Peds  Hematology negative hematology ROS (+)   Anesthesia Other Findings   Reproductive/Obstetrics                            Anesthesia Physical Anesthesia Plan  ASA: III  Anesthesia Plan: General   Post-op Pain Management:    Induction: Intravenous  PONV Risk Score and Plan: 3 and Ondansetron, Dexamethasone and Scopolamine patch - Pre-op  Airway Management Planned: Oral ETT  Additional Equipment:   Intra-op Plan:   Post-operative Plan: Extubation in OR  Informed Consent: I have reviewed the patients History and Physical, chart, labs and discussed the procedure including the risks, benefits and alternatives for the proposed anesthesia with the patient or authorized representative who has indicated his/her understanding and acceptance.     Dental advisory given  Plan Discussed with: Anesthesiologist and CRNA  Anesthesia Plan Comments: (See PAT note written 02/19/2020 by Shonna Chock, PA-C. )      Anesthesia Quick Evaluation

## 2020-02-19 NOTE — Progress Notes (Addendum)
Anesthesia Chart Review:  Case: [id number] Date/Time: 02/20/20 1106   Procedure: TYMPANOMASTOIDECTOMY WITH RECONSTRUCTION (Left )   Anesthesia type: General   Pre-op diagnosis: Cholesteatoma left ear   [facility] 09 / [facility]   Surgeons: Serena Colonel, MD      DISCUSSION: Patient is a 64 year old male scheduled for the above procedure. Recent diagnosis of left ear cholesteatoma.  Other history includes smoking, HTN, CAD (NSTEMI 10/25/19, s/p partially successful PCI LCX with residual 50-60% stenosis 10/25/19; may need staged procedure for LCX atherectomy and RCA intervention if recurrent angina, continue medical management as of 11/07/19, 02/07/20), migraine, pancreatitis (in setting of alcohol and cholelithiasis, s/p cholecystectomy 09/08/09), RML pulmonary nodule (with 6-12 month follow-up recommended, 10/2019 CTA; in-patient cardiology notes by Tessa Lerner, DO indicate patient informed with out-patient follow-up recommended), Bell's Palsy (01/07/20), impaired glucose tolerance. Reported on-going sobriety from alcohol.   - ED visit 01/07/20 for Bell's palsy with left facial involvement. No acute infarct and stable left cholesteatoma on head CT. Discharged with 7 day course of prednisone and ongoing ENT follow-up for left cholesteatoma.   - Hospitalization 12/22/19-12/31/19 for AMS/encephalopathy due to acute hyponatremia, thought to be either due to SIADH or beer potomania. Discharge summary suggests SIADH felt more likely due to decreased beer consumption (sober for previous week). Nephrologist was called. He was started on sodium chloride tablets. SSI used for glucose intolerance (A1c 6.3% in March, up to 6.7% on 12/28/19).  Primary care is aware of surgery plans--they were awaiting cardiac clearance for surgery at his May visit. (12/20/19 visit with Dayton Scrape, FNP-BC scanned under Media tab.) I spoke with Misty Stanley at Dr. Lucky Rathke office, and it sounds like they were aware of diagnosis of Bell's Palsy  last month.   Patient was last evaluated by cardiologist Dr. Rosemary Holms on 02/07/20.  He wrote: "Partially successful LCx intervention (10/2019) No chest pain symptoms at this time.  Given his other, rather time sensitive issues such as cholesteatoma, no indication for further coronary intervention at this time.  We will reassess this after his cholesteatoma surgery. Continue dual antiplatelet therapy with aspirin and Brilinta, continue atorvastatin, metoprolol, lisinopril. Brilinta can be stopped 5 days before cholesteatoma surgery, and resume thereafter. While his cardiac risk remains elevated owing to his underlying coronary artery disease, benefits of surgery outweigh cardiac risks, which are not prohibitive." He encouraged him to increase protein intake and follow-up with PCP to improve albumin/malnourishment. Patient remained sober. 3 month cardiology follow-up planned.   Sodium has normalized at 139. Last Brilinta 02/16/20. 02/18/20 presurgical COVID-19 test negative. Anesthesia team to evaluate on the day of surgery.   VS: BP 130/79   Pulse 71   Temp 36.8 C (Oral)   Resp 18   Ht 6\' 2"  (1.88 m)   Wt 61.7 kg   SpO2 100%   BMI 17.47 kg/m     PROVIDERS: , MD is PCP  [name], MD is cardiologist - Notes indicate that he has been referred to urology in [facility] for urinary incontinence with history of Foley catheter.   LABS: Labs reviewed: Acceptable for surgery. AST 13, ALT 14 01/07/20. A1c 6.7% on 12/28/19.  (all labs ordered are listed, but only abnormal results are displayed)  Labs Reviewed  BASIC METABOLIC PANEL - Abnormal; Notable for the following components:      Result Value   Glucose, Bld 103 (*)    All other components within normal limits  CBC - Abnormal; Notable for the following  components:   Hemoglobin 12.0 (*)    HCT 38.5 (*)    RDW 18.8 (*)    All other components within normal limits     IMAGES: CT Head 01/07/20: IMPRESSION: 1. Patchy  periventricular small vessel disease. Mild diffuse atrophy. No acute infarct. No mass or hemorrhage. 2.  There are foci of arterial vascular calcification. 3. Extensive opacification of multiple ethmoid air cells on the left as well as fairly diffuse opacification in the left middle ear region. A degree of bony destruction is noted in the left middle ear region. Suspect cholesteatoma in the left middle ear region. Note that the appearance is essentially stable compared to prior temporal bone CT examination. 4.  Mild paranasal sinus disease.  1V PCXR 12/29/19: FINDINGS: Stable cardiomediastinal contours with normal heart size. There is a bandlike opacity at the left base likely reflecting atelectasis. Small linear opacity in the right mid lung also likely atelectasis. No pneumothorax or significant pleural effusion. No acute finding in the visualized skeleton. IMPRESSION: Bandlike opacity at the left lung base likely reflecting atelectasis, early infection not excluded.  CTA Chest/Abd/Pelvis 10/24/19: IMPRESSION: 1. No acute intrathoracic, abdominal, or pelvic pathology. No aortic aneurysm or dissection. No pulmonary artery embolus. 2. A 6 mm right lower lobe pulmonary nodule. Non-contrast chest CT at 6-12 months is recommended. If the nodule is stable at time of repeat CT, then future CT at 18-24 months (from today's scan) is considered optional for low-risk patients, but is recommended for high-risk patients. This recommendation follows the consensus statement: Guidelines for Management of Incidental Pulmonary Nodules Detected on CT Images: From the Fleischner Society 2017; Radiology 2017; 284:228-243. 3. Aortic Atherosclerosis (ICD10-I70.0) and Emphysema (ICD10-J43.9).   EKG: 01/07/2020: Sinus rhythm Borderline right axis deviation Probable anteroseptal infarct, old No significant change since Confirmed by Vanetta Mulders [phone]) on 01/07/2020 12:58:41 PM   CV: Echo  10/25/2019: IMPRESSIONS  1. Normal LV systolic function; grade 1 diastolic dysfunction.  2. Left ventricular ejection fraction, by estimation, is 60 to 65%. The  left ventricle has normal function. The left ventricle has no regional  wall motion abnormalities. Left ventricular diastolic parameters are  consistent with Grade I diastolic  dysfunction (impaired relaxation).  3. Right ventricular systolic function is normal. The right ventricular  size is normal.  4. The mitral valve is normal in structure. Trivial mitral valve  regurgitation. No evidence of mitral stenosis.  5. The aortic valve is tricuspid. Aortic valve regurgitation is not  visualized. Mild aortic valve sclerosis is present, with no evidence of  aortic valve stenosis.  6. The inferior vena cava is normal in size with greater than 50%  respiratory variability, suggesting right atrial pressure of 3 mmHg.   Cardiac cath 10/25/19: LM: Distal 30%. dFR 1.0 LAD: Distal 30% stenosis Lcx: Severe tortuosity with moderate calcification.          Pros 60%. Mid 80-90% stenosis at LCx/OM         bifurcation ([place] 1,1,0) Ostial OM 40% stenosis.  RCA: Long diffuse prox-mid 80% stenosis.          Distal RCA 40% stenosis          PDA diffuse 60% stenosis.  - Complex procedure due to severity or tortuosity and calcification. Partially successful cutting balloon angioplasty prox-mid LCx Residual prox 40%, mid 40% stenosis at LCx/Om bifurcation, 60% calcific stenosis distal to bifurcation. This lesion remains inadequately prepared for stenting due to severe calcification.  - Recommendation: Staging the procedure with  consideration for femoral access and atherectomy before stenting, followed by PCI to RCA. Continue DAPT with Aspirin and Brilinta.   Past Medical History:  Diagnosis Date  . Bell's palsy 01/07/2020  . Coronary artery disease   . Heart attack (HCC) 10/25/2019  . Hypertension 2012  . Impaired glucose tolerance    . Migraine   . NSTEMI (non-ST elevated myocardial infarction) (HCC)   . Pulmonary nodule    6 mm RML pulmonary nodule on 10/24/19 CTA    Past Surgical History:  Procedure Laterality Date  . BACK SURGERY  2016  . CHOLECYSTECTOMY  2007  . CORONARY ANGIOPLASTY  10/25/2019   CORONARY BALLOON ANGIOPLASTY  . CORONARY BALLOON ANGIOPLASTY N/A 10/25/2019   Procedure: CORONARY BALLOON ANGIOPLASTY;  Surgeon: [name], MD;  [facility]: [facility];  Service: Cardiovascular;  Laterality: N/A;  . INTRAVASCULAR PRESSURE WIRE/FFR STUDY N/A 10/25/2019   Procedure: INTRAVASCULAR PRESSURE WIRE/FFR STUDY;  Surgeon: [name], MD;  Location: [facility] CV LAB;  Service: Cardiovascular;  Laterality: N/A;  . INTRAVASCULAR ULTRASOUND/IVUS N/A 10/25/2019   Procedure: Intravascular Ultrasound/IVUS;  Surgeon: [name], MD;  Location: [facility];  Service: Cardiovascular;  Laterality: N/A;  . LEFT HEART CATH AND CORONARY ANGIOGRAPHY N/A 10/25/2019   Procedure: LEFT HEART CATH AND CORONARY ANGIOGRAPHY;  Surgeon: [name], MD;  Location: [facility];  Service: Cardiovascular;  Laterality: N/A;    MEDICATIONS: . aspirin 81 MG EC tablet  . atorvastatin (LIPITOR) 80 MG tablet  . Ensure (ENSURE)  . feeding supplement (BOOST HIGH PROTEIN) LIQD  . lisinopril (ZESTRIL) 10 MG tablet  . metoprolol tartrate (LOPRESSOR) 25 MG tablet  . nitroGLYCERIN (NITROSTAT) 0.4 MG SL tablet  . sodium chloride 1 g tablet  . ticagrelor (BRILINTA) 90 MG TABS tablet   No current facility-administered medications for this encounter.    Shonna Chock, PA-C Surgical Short Stay/Anesthesiology [facility] Phone [phone] [facility] Phone [phone] 02/19/2020 1:36 PM

## 2020-02-20 ENCOUNTER — Observation Stay (HOSPITAL_COMMUNITY)
Admission: RE | Admit: 2020-02-20 | Discharge: 2020-02-21 | Disposition: A | Discharge status: Home / Self Care | Payer: PPO | Attending: Otolaryngology | Admitting: Otolaryngology

## 2020-02-20 ENCOUNTER — Ambulatory Visit (HOSPITAL_COMMUNITY): Payer: PPO

## 2020-02-20 ENCOUNTER — Ambulatory Visit (HOSPITAL_COMMUNITY): Payer: PPO | Admitting: Vascular Surgery

## 2020-02-20 ENCOUNTER — Other Ambulatory Visit: Payer: Self-pay

## 2020-02-20 ENCOUNTER — Encounter (HOSPITAL_COMMUNITY): Payer: Self-pay | Admitting: Otolaryngology

## 2020-02-20 ENCOUNTER — Encounter (HOSPITAL_COMMUNITY)
Admission: RE | Disposition: A | Discharge status: Home / Self Care | Payer: Self-pay | Source: Home / Self Care | Attending: Otolaryngology

## 2020-02-20 DIAGNOSIS — I25118 Atherosclerotic heart disease of native coronary artery with other forms of angina pectoris: Secondary | ICD-10-CM | POA: Diagnosis not present

## 2020-02-20 DIAGNOSIS — F17200 Nicotine dependence, unspecified, uncomplicated: Secondary | ICD-10-CM | POA: Insufficient documentation

## 2020-02-20 DIAGNOSIS — I10 Essential (primary) hypertension: Secondary | ICD-10-CM | POA: Diagnosis not present

## 2020-02-20 DIAGNOSIS — Z9089 Acquired absence of other organs: Secondary | ICD-10-CM

## 2020-02-20 DIAGNOSIS — E871 Hypo-osmolality and hyponatremia: Secondary | ICD-10-CM | POA: Diagnosis not present

## 2020-02-20 DIAGNOSIS — H6042 Cholesteatoma of left external ear: Principal | ICD-10-CM | POA: Insufficient documentation

## 2020-02-20 DIAGNOSIS — H7192 Unspecified cholesteatoma, left ear: Secondary | ICD-10-CM | POA: Diagnosis not present

## 2020-02-20 HISTORY — PX: TYMPANOMASTOIDECTOMY WITH RECONSTRUCTION: SHX5679

## 2020-02-20 SURGERY — TYMPANOMASTOIDECTOMY WITH RECONSTRUCTION
Anesthesia: General | Site: Ear | Laterality: Left

## 2020-02-20 MED ORDER — FENTANYL CITRATE (PF) 100 MCG/2ML IJ SOLN
INTRAMUSCULAR | Status: DC | PRN
  Administered 2020-02-20: 50 ug via INTRAVENOUS
  Administered 2020-02-20 (×2): 25 ug via INTRAVENOUS
  Administered 2020-02-20: 50 ug via INTRAVENOUS
  Administered 2020-02-20: 25 ug via INTRAVENOUS

## 2020-02-20 MED ORDER — ROCURONIUM BROMIDE 10 MG/ML (PF) SYRINGE
PREFILLED_SYRINGE | INTRAVENOUS | Status: DC | PRN
  Administered 2020-02-20: 60 mg via INTRAVENOUS

## 2020-02-20 MED ORDER — LIDOCAINE 2% (20 MG/ML) 5 ML SYRINGE
INTRAMUSCULAR | Status: DC | PRN
  Administered 2020-02-20: 100 mg via INTRAVENOUS

## 2020-02-20 MED ORDER — ACETAMINOPHEN 160 MG/5ML PO SOLN: 650.0000 mg | ORAL | Status: DC | PRN

## 2020-02-20 MED ORDER — ENSURE ENLIVE PO LIQD
237.0000 mL | Freq: Two times a day (BID) | ORAL | Status: DC
  Administered 2020-02-20: 237 mL via ORAL

## 2020-02-20 MED ORDER — PROPOFOL 10 MG/ML IV BOLUS
INTRAVENOUS | Status: DC | PRN
  Administered 2020-02-20: 14 mg via INTRAVENOUS

## 2020-02-20 MED ORDER — METOPROLOL TARTRATE 25 MG PO TABS
25.0000 mg | ORAL_TABLET | Freq: Every day | ORAL | Status: DC
  Filled 2020-02-20: qty 1

## 2020-02-20 MED ORDER — DEXAMETHASONE SODIUM PHOSPHATE 10 MG/ML IJ SOLN
INTRAMUSCULAR | Status: DC | PRN
  Administered 2020-02-20: 5 mg via INTRAVENOUS

## 2020-02-20 MED ORDER — HYDROCODONE-ACETAMINOPHEN 5-325 MG PO TABS
1.0000 | ORAL_TABLET | ORAL | Status: DC | PRN
  Administered 2020-02-20 (×2): 2 via ORAL
  Filled 2020-02-20 (×3): qty 2

## 2020-02-20 MED ORDER — ASPIRIN EC 81 MG PO TBEC
81.0000 mg | DELAYED_RELEASE_TABLET | Freq: Every day | ORAL | Status: DC
  Administered 2020-02-20: 81 mg via ORAL
  Filled 2020-02-20 (×2): qty 1

## 2020-02-20 MED ORDER — CIPROFLOXACIN-DEXAMETHASONE 0.3-0.1 % OT SUSP
OTIC | Status: DC | PRN
  Administered 2020-02-20: 4 [drp] via OTIC

## 2020-02-20 MED ORDER — SODIUM CHLORIDE 1 G PO TABS
1.0000 g | ORAL_TABLET | Freq: Two times a day (BID) | ORAL | Status: DC
  Administered 2020-02-20: 1 g via ORAL
  Filled 2020-02-20 (×2): qty 1

## 2020-02-20 MED ORDER — EPINEPHRINE HCL (NASAL) 0.1 % NA SOLN
NASAL | Status: AC
  Filled 2020-02-20: qty 30

## 2020-02-20 MED ORDER — LIDOCAINE-EPINEPHRINE 1 %-1:100000 IJ SOLN
INTRAMUSCULAR | Status: DC | PRN
  Administered 2020-02-20: 10 mL

## 2020-02-20 MED ORDER — ATORVASTATIN CALCIUM 80 MG PO TABS
80.0000 mg | ORAL_TABLET | Freq: Every day | ORAL | Status: DC
  Administered 2020-02-20: 80 mg via ORAL
  Filled 2020-02-20: qty 1

## 2020-02-20 MED ORDER — ONDANSETRON HCL 4 MG/2ML IJ SOLN
INTRAMUSCULAR | Status: DC | PRN
  Administered 2020-02-20: 4 mg via INTRAVENOUS

## 2020-02-20 MED ORDER — SODIUM CHLORIDE 0.9 % IR SOLN
Status: DC | PRN
  Administered 2020-02-20: 1000 mL

## 2020-02-20 MED ORDER — ALBUMIN HUMAN 5 % IV SOLN: INTRAVENOUS | Status: DC | PRN

## 2020-02-20 MED ORDER — ONDANSETRON HCL 4 MG/2ML IJ SOLN
INTRAMUSCULAR | Status: AC
  Filled 2020-02-20: qty 2

## 2020-02-20 MED ORDER — PROMETHAZINE HCL 25 MG PO TABS: 25.0000 mg | ORAL_TABLET | Freq: Four times a day (QID) | ORAL | Status: DC | PRN

## 2020-02-20 MED ORDER — DEXTROSE-NACL 5-0.9 % IV SOLN: INTRAVENOUS | Status: DC

## 2020-02-20 MED ORDER — NITROGLYCERIN 0.4 MG SL SUBL: 0.4000 mg | SUBLINGUAL_TABLET | SUBLINGUAL | Status: DC | PRN

## 2020-02-20 MED ORDER — PHENYLEPHRINE HCL-NACL 10-0.9 MG/250ML-% IV SOLN
INTRAVENOUS | Status: DC | PRN
  Administered 2020-02-20: 40 ug/min via INTRAVENOUS

## 2020-02-20 MED ORDER — LACTATED RINGERS IV SOLN: INTRAVENOUS | Status: DC

## 2020-02-20 MED ORDER — PROPOFOL 10 MG/ML IV BOLUS
INTRAVENOUS | Status: AC
  Filled 2020-02-20: qty 20

## 2020-02-20 MED ORDER — BOOST HIGH PROTEIN PO LIQD
1.0000 | Freq: Every day | ORAL | Status: DC
  Filled 2020-02-20 (×2): qty 237

## 2020-02-20 MED ORDER — LIDOCAINE 2% (20 MG/ML) 5 ML SYRINGE
INTRAMUSCULAR | Status: AC
  Filled 2020-02-20: qty 5

## 2020-02-20 MED ORDER — FENTANYL CITRATE (PF) 250 MCG/5ML IJ SOLN
INTRAMUSCULAR | Status: AC
  Filled 2020-02-20: qty 5

## 2020-02-20 MED ORDER — METHYLENE BLUE 0.5 % INJ SOLN
INTRAVENOUS | Status: DC | PRN
  Administered 2020-02-20: 3 mL

## 2020-02-20 MED ORDER — DEXAMETHASONE SODIUM PHOSPHATE 10 MG/ML IJ SOLN
INTRAMUSCULAR | Status: AC
  Filled 2020-02-20: qty 1

## 2020-02-20 MED ORDER — ROCURONIUM BROMIDE 10 MG/ML (PF) SYRINGE
PREFILLED_SYRINGE | INTRAVENOUS | Status: AC
  Filled 2020-02-20: qty 10

## 2020-02-20 MED ORDER — METHYLENE BLUE 0.5 % INJ SOLN
INTRAVENOUS | Status: AC
  Filled 2020-02-20: qty 10

## 2020-02-20 MED ORDER — PROMETHAZINE HCL 25 MG RE SUPP
25.0000 mg | Freq: Four times a day (QID) | RECTAL | Status: DC | PRN
  Filled 2020-02-20: qty 1

## 2020-02-20 MED ORDER — BACITRACIN ZINC 500 UNIT/GM EX OINT
TOPICAL_OINTMENT | CUTANEOUS | Status: AC
  Filled 2020-02-20: qty 28.35

## 2020-02-20 MED ORDER — ESMOLOL HCL 100 MG/10ML IV SOLN
INTRAVENOUS | Status: DC | PRN
  Administered 2020-02-20: 10 mg via INTRAVENOUS

## 2020-02-20 MED ORDER — CHLORHEXIDINE GLUCONATE 0.12 % MT SOLN
15.0000 mL | Freq: Once | OROMUCOSAL | Status: AC
  Administered 2020-02-20: 15 mL via OROMUCOSAL
  Filled 2020-02-20: qty 15

## 2020-02-20 MED ORDER — ENSURE PO LIQD: 237.0000 mL | Freq: Two times a day (BID) | ORAL | Status: DC

## 2020-02-20 MED ORDER — MIDAZOLAM HCL 2 MG/2ML IJ SOLN
INTRAMUSCULAR | Status: AC
  Filled 2020-02-20: qty 2

## 2020-02-20 MED ORDER — LIDOCAINE-EPINEPHRINE 1 %-1:100000 IJ SOLN
INTRAMUSCULAR | Status: AC
Start: 2020-02-20 — End: ?
  Filled 2020-02-20: qty 1

## 2020-02-20 MED ORDER — LISINOPRIL 10 MG PO TABS
10.0000 mg | ORAL_TABLET | Freq: Every day | ORAL | Status: DC
  Administered 2020-02-20: 10 mg via ORAL
  Filled 2020-02-20 (×2): qty 1

## 2020-02-20 MED ORDER — SUGAMMADEX SODIUM 200 MG/2ML IV SOLN
INTRAVENOUS | Status: DC | PRN
  Administered 2020-02-20: 200 mg via INTRAVENOUS

## 2020-02-20 MED ORDER — ORAL CARE MOUTH RINSE: 15.0000 mL | Freq: Once | OROMUCOSAL | Status: AC

## 2020-02-20 MED ORDER — HEMOSTATIC AGENTS (NO CHARGE) OPTIME
TOPICAL | Status: DC | PRN
Start: 2020-02-20 — End: 2020-02-20
  Administered 2020-02-20: 1 via TOPICAL

## 2020-02-20 MED ORDER — EPINEPHRINE HCL (NASAL) 0.1 % NA SOLN
NASAL | Status: DC | PRN
  Administered 2020-02-20: 30 mL via TOPICAL

## 2020-02-20 MED ORDER — CIPROFLOXACIN-DEXAMETHASONE 0.3-0.1 % OT SUSP
OTIC | Status: AC
  Filled 2020-02-20: qty 7.5

## 2020-02-20 MED ORDER — ACETAMINOPHEN 650 MG RE SUPP: 650.0000 mg | RECTAL | Status: DC | PRN

## 2020-02-20 SURGICAL SUPPLY — 63 items
ATTRACTOMAT 16X20 MAGNETIC DRP (DRAPES) IMPLANT
BLADE CLIPPER SURG (BLADE) IMPLANT
BLADE EAR TYMPAN 2.5 60D BEAV (BLADE) ×3 IMPLANT
BLADE SURG 15 STRL LF DISP TIS (BLADE) IMPLANT
BLADE SURG 15 STRL SS (BLADE)
BNDG CONFORM 3 STRL LF (GAUZE/BANDAGES/DRESSINGS) IMPLANT
BNDG GAUZE ELAST 4 BULKY (GAUZE/BANDAGES/DRESSINGS) IMPLANT
BUR 7 SOFT (BURR) ×2 IMPLANT
BUR 7MM SOFT (BURR) ×1
BUR RND DIAMOND ELITE 3.5 (BURR) ×2 IMPLANT
BUR RND DIAMOND ELITE 3.5MM (BURR) ×1
BUR ROUND FLUTED 5 RND (BURR) ×2 IMPLANT
BUR ROUND FLUTED 5MM RND (BURR) ×1
BUR SABER DIAMOND 3.0 (BURR) ×3 IMPLANT
BUR SABER DIAMOND 5.0 (BURR) ×3 IMPLANT
BUR SABER TAPERED DIAMOND 1 (BURR) ×3 IMPLANT
CANISTER SUCT 3000ML PPV (MISCELLANEOUS) ×3 IMPLANT
CLEANER TIP ELECTROSURG 2X2 (MISCELLANEOUS) ×3 IMPLANT
CNTNR URN SCR LID CUP LEK RST (MISCELLANEOUS) ×1 IMPLANT
CONT SPEC 4OZ STRL OR WHT (MISCELLANEOUS) ×2
COTTONBALL LRG STERILE PKG (GAUZE/BANDAGES/DRESSINGS) ×3 IMPLANT
COVER SURGICAL LIGHT HANDLE (MISCELLANEOUS) ×3 IMPLANT
COVER WAND RF STERILE (DRAPES) ×3 IMPLANT
DERMABOND ADVANCED (GAUZE/BANDAGES/DRESSINGS) ×2
DERMABOND ADVANCED .7 DNX12 (GAUZE/BANDAGES/DRESSINGS) ×1 IMPLANT
DRAPE EENT ADH APERT 31X51 STR (DRAPES) ×3 IMPLANT
DRAPE HALF SHEET 40X57 (DRAPES) IMPLANT
DRAPE MICROSCOPE LEICA 54X105 (DRAPES) ×3 IMPLANT
DRESSING ADAPTIC 1/2  N-ADH (PACKING) ×3 IMPLANT
DRSG GLASSCOCK MASTOID ADT (GAUZE/BANDAGES/DRESSINGS) ×3 IMPLANT
ELECT COATED BLADE 2.86 ST (ELECTRODE) ×3 IMPLANT
ELECT REM PT RETURN 9FT ADLT (ELECTROSURGICAL) ×3
ELECTRODE REM PT RTRN 9FT ADLT (ELECTROSURGICAL) ×1 IMPLANT
GAUZE PACKING 1/2X5YD (GAUZE/BANDAGES/DRESSINGS) ×3 IMPLANT
GAUZE PACKING IODOFORM 1/2 (PACKING) IMPLANT
GLOVE ECLIPSE 7.5 STRL STRAW (GLOVE) ×3 IMPLANT
GOWN STRL REUS W/ TWL LRG LVL3 (GOWN DISPOSABLE) ×2 IMPLANT
GOWN STRL REUS W/TWL LRG LVL3 (GOWN DISPOSABLE) ×4
HEMOSTAT SURGICEL .5X2 ABSORB (HEMOSTASIS) IMPLANT
KIT BASIN OR (CUSTOM PROCEDURE TRAY) ×3 IMPLANT
KIT TURNOVER KIT B (KITS) ×3 IMPLANT
NEEDLE PRECISIONGLIDE 27X1.5 (NEEDLE) ×3 IMPLANT
PAD ARMBOARD 7.5X6 YLW CONV (MISCELLANEOUS) ×6 IMPLANT
PENCIL FOOT CONTROL (ELECTRODE) ×3 IMPLANT
PUNCH BIOPSY 4MM (MISCELLANEOUS) ×3
PUNCH BIOPSY DISP 4 (MISCELLANEOUS) ×1 IMPLANT
SPECIMEN JAR SMALL (MISCELLANEOUS) ×3 IMPLANT
SPONGE SURGIFOAM ABS GEL 12-7 (HEMOSTASIS) ×6 IMPLANT
SUT CHROMIC 3 0 PS 2 (SUTURE) IMPLANT
SUT CHROMIC 4 0 PS 5 (SUTURE) IMPLANT
SUT ETHILON 3 0 PS 1 (SUTURE) IMPLANT
SUT ETHILON 5 0 PS 2 18 (SUTURE) IMPLANT
SUT VIC AB 3-0 FS2 27 (SUTURE) ×3 IMPLANT
SUT VIC AB 3-0 PS2 18 (SUTURE)
SUT VIC AB 3-0 PS2 18XBRD (SUTURE) IMPLANT
SYR CONTROL 10ML LL (SYRINGE) ×3 IMPLANT
SYR TB 1ML LUER SLIP (SYRINGE) ×3 IMPLANT
TOWEL GREEN STERILE FF (TOWEL DISPOSABLE) ×3 IMPLANT
TRAY ENT MC OR (CUSTOM PROCEDURE TRAY) ×3 IMPLANT
TUBING EXTENTION W/L.L. (IV SETS) ×3 IMPLANT
TUBING IRRIGATION (MISCELLANEOUS) ×3 IMPLANT
WATER STERILE IRR 1000ML POUR (IV SOLUTION) ×3 IMPLANT
WIPE INSTRUMENT VISIWIPE 73X73 (MISCELLANEOUS) ×3 IMPLANT

## 2020-02-20 NOTE — Anesthesia Procedure Notes (Signed)
Procedure Name: Intubation Date/Time: 02/20/2020 11:32 AM Performed by: Imagene Riches, CRNA Pre-anesthesia Checklist: Patient identified, Emergency Drugs available, Suction available and Patient being monitored Patient Re-evaluated:Patient Re-evaluated prior to induction Oxygen Delivery Method: Circle System Utilized Preoxygenation: Pre-oxygenation with 100% oxygen Induction Type: IV induction Ventilation: Mask ventilation without difficulty Laryngoscope Size: Mac and 4 Grade View: Grade I Tube type: Oral Tube size: 7.5 mm Number of attempts: 1 Airway Equipment and Method: Stylet and Oral airway Placement Confirmation: ETT inserted through vocal cords under direct vision,  positive ETCO2 and breath sounds checked- equal and bilateral Secured at: 22 cm Tube secured with: Tape Dental Injury: Teeth and Oropharynx as per pre-operative assessment  Comments: Performed by Reece Agar, SRNA under supervision of CRNA and MDA

## 2020-02-20 NOTE — Progress Notes (Signed)
Patient ID: Michael Rivas, male   DOB: Jan 24, 1956, 64 y.o.   MRN: [id number]  [name] is awake and alert and eating is generous.  Glasscock dressing in place.  Small amount of bloody drainage in the dressing.  Facial nerve function no change.  No nystagmus.  Stable postop.  Continue overnight care.

## 2020-02-20 NOTE — Interval H&P Note (Signed)
History and Physical Interval Note:  02/20/2020 10:48 AM  Michael Rivas  has presented today for surgery, with the diagnosis of Cholesteatoma left ear.  The various methods of treatment have been discussed with the patient and family. After consideration of risks, benefits and other options for treatment, the patient has consented to  Procedure(s): TYMPANOMASTOIDECTOMY WITH RECONSTRUCTION (Left) as a surgical intervention.  The patient's history has been reviewed, patient examined, no change in status, stable for surgery.  I have reviewed the patient's chart and labs.  Questions were answered to the patient's satisfaction.     Serena Colonel

## 2020-02-20 NOTE — Progress Notes (Signed)
Michael Rivas is a 64 y.o. male patient admitted. Awake, alert - oriented  X 4 - no acute distress noted.  VSS - Blood pressure 111/67, pulse 68, temperature 98.6 F (37 C), temperature source Oral, resp. rate 14, height 6\' 2"  (1.88 m), weight 61.7 kg, SpO2 97 %.    IV in place to left arm, occlusive dsg intact without redness. Orientation to room, and floor completed.  Admission INP armband ID verified with patient/family, and in place.   SR up x 2, fall assessment complete, with patient and family able to verbalize understanding of risk associated with falls, and verbalized understanding to call nsg before up out of bed.  Call light within reach, patient able to voice, and demonstrate understanding. No evidence of skin break down noted on exam.  Admission nurse notified of admission.     Will cont to eval and treat per MD orders.  , RN 02/20/2020 9:32 PM

## 2020-02-20 NOTE — Anesthesia Postprocedure Evaluation (Signed)
Anesthesia Post Note  Patient: Michael Rivas  Procedure(s) Performed: TYMPANOMASTOIDECTOMY WITH RECONSTRUCTION (Left Ear)     Patient location during evaluation: PACU Anesthesia Type: General Level of consciousness: sedated Pain management: pain level controlled Vital Signs Assessment: post-procedure vital signs reviewed and stable Respiratory status: spontaneous breathing and respiratory function stable Cardiovascular status: stable Postop Assessment: no apparent nausea or vomiting Anesthetic complications: no   No complications documented.  Last Vitals:  Vitals:   02/20/20 1409 02/20/20 1425  BP:  122/73  Pulse:  67  Resp:  18  Temp: 36.5 C 36.5 C  SpO2:  98%               Valgene Deloatch DANIEL

## 2020-02-20 NOTE — Op Note (Signed)
OPERATIVE REPORT  DATE OF SURGERY: 02/20/2020  PATIENT:  Michael Rivas,  64 y.o. male  PRE-OPERATIVE DIAGNOSIS:  Cholesteatoma left ear  POST-OPERATIVE DIAGNOSIS:  Cholesteatoma left ear  PROCEDURE:  Procedure(s): TYMPANOMASTOIDECTOMY WITH RECONSTRUCTION  SURGEON:  Susy Frizzle, MD  ASSISTANTS: None  ANESTHESIA:   General   EBL: 100 ml  DRAINS: None  LOCAL MEDICATIONS USED: 1% Xylocaine with epinephrine  SPECIMEN: Left middle ear and mastoid contents  COUNTS:  Correct  PROCEDURE DETAILS:  In the holding area the patient was found to have a left facial paralysis.  This had started in the last several weeks.  He feels that it had started getting a little bit better.  My examination revealed a near complete facial paralysis involving all branches of the left facial nerve.  Eye closure was incomplete.   The patient was taken to the operating room and placed on the operating table in the supine position. Following induction of general endotracheal anesthesia, the left ear was prepped and draped in a standard fashion.  Postauricular incision was infiltrated with local anesthetic solution as was the ear canal in 4 quadrants.  Ear canal contains significant edematous and granulation tissue with some exposed fragments of bones.  Tympanic membrane was not visible among all of this disease.  The postauricular incision was accomplished using electrocautery.  Graft was harvested from loose areolar tissue superficial to the temporalis fascia.  This was pressed and dried on the back table.  The linea temporalis was divided down to the bone and the mastoid periosteum was incised in a T fashion.  Self-retaining retractors were used for the remainder of the case.  The ear was brought forward.  A cortical mastoidectomy was performed using a large cutting bur.  The bone was diffusely sclerotic.  Mastoid sinus was entered and there was minimal disease present in the majority of the mastoid.   Dissection continued along superiorly along the tegmen back to the sinodural angle.  There was no exposed dura.  Posterior dissection along the sigmoid sinus was accomplished.  There is no dehiscence or disease present there.  Inferiorly the dissection continued down to the digastric ridge.  Anteriorly the canal wall was taken down.  The facial nerve was identified and appeared to be completely dehisced along the horizontal and vertical segment.  The nerve itself was left unmolested.  Dissection continued anteriorly to the root of the zygoma.  There is extensive cholesteatoma matrix present with erosion of bone in this area.  This continued all the way into the tympanic cavity.  The hepatic cavity was filled with dense granulation tissue and cholesteatoma matrix.  There were 2 ossicular remnants that were removed with the remainder of the specimen.  Otherwise the ossicular chain was completely eroded away.  The pyramidal eminence was identified.  No other middle ear landmarks were identified.  There was no tympanic membrane remnant identified either.  All of the cholesteatoma matrix and fragments of bone that had been eroded were cleaned out of the tympanic cavity and sent for pathologic evaluation.  All bony ledges were taken down in all directions along the ear canal to further expose it there is any additional disease present and everything that was identified was cleaned out.  The mastoid cavity was then completed using a variety of diamond burs.  All bony pockets were opened and smooth cavity was created.  Ciprodex soaked Gelfoam pieces were placed within the middle ear.  The graft was then placed on top of  that draped over the facial nerve into the mastoid cavity.  A large meatoplasty was created first injecting local anesthetic solution into the superior and inferior incisura and then 15 blade was used to incise the skin.  The meatoplasty was brought backwards on itself and secured in place using  interrupted 3-0 Vicryl sutures.  The postauricular incision was then reapproximated using a running subcuticular 3-0 Vicryl suture.  Dermabond was used on the skin.  The mastoid cavity was then packed with Adaptic dressing.  Glasscock dressing was applied.  Patient was then awakened extubated and transferred to recovery in stable condition.    PATIENT DISPOSITION:  To PACU, stable

## 2020-02-20 NOTE — Transfer of Care (Signed)
Immediate Anesthesia Transfer of Care Note  Patient: Michael Rivas  Procedure(s) Performed: TYMPANOMASTOIDECTOMY WITH RECONSTRUCTION (Left Ear)  Patient Location: PACU  Anesthesia Type:General  Level of Consciousness: awake  Airway & Oxygen Therapy: Patient Spontanous Breathing and Patient connected to face mask oxygen  Post-op Assessment: Report given to RN, Post -op Vital signs reviewed and stable and Patient moving all extremities  Post vital signs: Reviewed and stable  Last Vitals:  Vitals Value Taken Time  BP 123/76 02/20/20 1330  Temp 36.6 C 02/20/20 1330  Pulse 67 02/20/20 1334  Resp 13 02/20/20 1334  SpO2 100 % 02/20/20 1334  Vitals shown include unvalidated device data.  Last Pain:  Vitals:   02/20/20 1330  TempSrc:   PainSc: 8       Patients Stated Pain Goal: 4 (02/20/20 1330)  Complications: No complications documented.

## 2020-02-21 ENCOUNTER — Encounter (HOSPITAL_COMMUNITY): Payer: Self-pay | Admitting: Otolaryngology

## 2020-02-21 DIAGNOSIS — H6042 Cholesteatoma of left external ear: Secondary | ICD-10-CM | POA: Diagnosis not present

## 2020-02-21 LAB — SURGICAL PATHOLOGY

## 2020-02-21 MED ORDER — PROMETHAZINE HCL 25 MG RE SUPP
25.0000 mg | Freq: Four times a day (QID) | RECTAL | 1 refills | Status: DC | PRN
Start: 2020-02-21 — End: 2020-12-17

## 2020-02-21 MED ORDER — HYDROCODONE-ACETAMINOPHEN 7.5-325 MG PO TABS: 1.0000 | ORAL_TABLET | Freq: Four times a day (QID) | ORAL | 0 refills | Status: DC | PRN

## 2020-02-21 NOTE — Discharge Summary (Signed)
Physician Discharge Summary  Patient ID: Michael Rivas MRN: [id number] DOB/AGE: 04/01/1956 64 y.o.  Admit date: 02/20/2020 Discharge date: 02/21/2020  Admission Diagnoses: Cholesteatoma  Discharge Diagnoses:  Active Problems:   Status post mastoidectomy   Discharged Condition: fair  Hospital Course: No complications  Consults: none  Significant Diagnostic Studies: none  Treatments: surgery: Left modified radical mastoidectomy  Discharge Exam: Blood pressure 125/70, pulse 61, temperature 98.4 F (36.9 C), temperature source Oral, resp. rate 15, height 6\' 2"  (1.88 m), weight 61.7 kg, SpO2 100 %. PHYSICAL EXAM: Awake and alert.  Dressing in place with small amount of blood staining.  Facial paralysis on left same as preop.  No nystagmus.  Disposition: Discharge disposition: 01-Home or Self Care       Discharge Instructions    Diet - low sodium heart healthy   Complete by: As directed    Increase activity slowly   Complete by: As directed    No wound care   Complete by: As directed      Allergies as of 02/21/2020   No Known Allergies     Medication List    TAKE these medications   aspirin 81 MG EC tablet Take 1 tablet (81 mg total) by mouth daily.   atorvastatin 80 MG tablet Commonly known as: LIPITOR Take 1 tablet (80 mg total) by mouth daily at 6 PM.   Ensure Take 237 mLs by mouth daily.   feeding supplement Liqd Take 1 Container by mouth daily.   HYDROcodone-acetaminophen 7.5-325 MG tablet Commonly known as: Norco Take 1 tablet by mouth every 6 (six) hours as needed for moderate pain.   lisinopril 10 MG tablet Commonly known as: ZESTRIL Take 1 tablet by mouth daily.   metoprolol tartrate 25 MG tablet Commonly known as: LOPRESSOR Take 1 tablet (25 mg total) by mouth 2 (two) times daily. What changed: when to take this   nitroGLYCERIN 0.4 MG SL tablet Commonly known as: Nitrostat Place 1 tablet (0.4 mg total) under the tongue every 5 (five)  minutes as needed for chest pain. Take for up to 3 doses. Call physician.   promethazine 25 MG suppository Commonly known as: PHENERGAN Place 1 suppository (25 mg total) rectally every 6 (six) hours as needed for nausea or vomiting.   sodium chloride 1 g tablet Take 1 tablet (1 g total) by mouth 2 (two) times daily with a meal.   ticagrelor 90 MG Tabs tablet Commonly known as: BRILINTA Take 90 mg by mouth in the morning and at bedtime.       Follow-up Information    11-30-1976, MD Follow up on 02/25/2020.   Specialty: Otolaryngology Why: 1 PM for dressing removal. Contact information: [address] [place] [phone]               Signed: [phone] 02/21/2020, 8:36 AM

## 2020-02-21 NOTE — Discharge Instructions (Signed)
Keep the dressing in place and keep it dry.  Resume your Brilinta Friday morning.  Contact your medical doctor to find out about your sodium and other medications.

## 2020-02-25 DIAGNOSIS — G510 Bell's palsy: Secondary | ICD-10-CM | POA: Insufficient documentation

## 2020-03-14 ENCOUNTER — Ambulatory Visit: Payer: PPO | Admitting: Urology

## 2020-04-11 ENCOUNTER — Other Ambulatory Visit: Payer: Self-pay | Admitting: Cardiology

## 2020-04-11 ENCOUNTER — Ambulatory Visit: Payer: PPO | Admitting: Urology

## 2020-05-04 ENCOUNTER — Other Ambulatory Visit: Payer: Self-pay | Admitting: Cardiology

## 2020-05-09 ENCOUNTER — Ambulatory Visit: Payer: Medicare Other | Admitting: Cardiology

## 2020-05-09 ENCOUNTER — Encounter: Payer: Self-pay | Admitting: Cardiology

## 2020-05-09 ENCOUNTER — Other Ambulatory Visit: Payer: Self-pay

## 2020-05-09 VITALS — BP 133/82 | HR 69 | Resp 17 | Ht 74.0 in | Wt 146.0 lb

## 2020-05-09 DIAGNOSIS — I10 Essential (primary) hypertension: Secondary | ICD-10-CM

## 2020-05-09 DIAGNOSIS — I2510 Atherosclerotic heart disease of native coronary artery without angina pectoris: Secondary | ICD-10-CM

## 2020-05-09 MED ORDER — TICAGRELOR 90 MG PO TABS: 90.0000 mg | ORAL_TABLET | Freq: Two times a day (BID) | ORAL | 1 refills | Status: DC

## 2020-05-09 MED ORDER — ATORVASTATIN CALCIUM 80 MG PO TABS: 80.0000 mg | ORAL_TABLET | Freq: Every day | ORAL | 1 refills | Status: DC

## 2020-05-09 MED ORDER — METOPROLOL TARTRATE 25 MG PO TABS: 25.0000 mg | ORAL_TABLET | Freq: Two times a day (BID) | ORAL | 3 refills | Status: DC

## 2020-05-09 MED ORDER — LISINOPRIL 10 MG PO TABS: 10.0000 mg | ORAL_TABLET | Freq: Every evening | ORAL | 3 refills | Status: DC

## 2020-05-09 MED ORDER — ASPIRIN EC 81 MG PO TBEC: 81.0000 mg | DELAYED_RELEASE_TABLET | Freq: Every day | ORAL | 3 refills | Status: DC

## 2020-05-09 NOTE — Progress Notes (Signed)
Follow up visit  Subjective:   Michael Rivas, male    DOB: [birth year], 64 y.o.   MRN: [id number]    HPI   Chief Complaint  Patient presents with  . Coronary Artery Disease  . Follow-up    3 month   64 y.o. African American male with hypertension, hyperlipidemia, tobacco dependence, CAD,   Patient underwent successful cholesteatoma with resolution of his left earache. He has not had any exertional chest pain symptoms.   Current Outpatient Medications on File Prior to Visit  Medication Sig Dispense Refill  . atorvastatin (LIPITOR) 80 MG tablet Take 1 tablet (80 mg total) by mouth daily at 6 PM. 90 tablet 1  . Ensure (ENSURE) Take 237 mLs by mouth daily.     . feeding supplement (BOOST HIGH PROTEIN) LIQD Take 1 Container by mouth daily.    Marland Kitchen HYDROcodone-acetaminophen (NORCO) 7.5-325 MG tablet Take 1 tablet by mouth every 6 (six) hours as needed for moderate pain. 20 tablet 0  . lisinopril (ZESTRIL) 10 MG tablet TAKE 1 TABLET BY MOUTH ONCE DAILY IN THE EVENING 30 tablet 0  . metoprolol tartrate (LOPRESSOR) 25 MG tablet Take 1 tablet (25 mg total) by mouth 2 (two) times daily. (Patient taking differently: Take 25 mg by mouth daily. )    . nitroGLYCERIN (NITROSTAT) 0.4 MG SL tablet Place 1 tablet (0.4 mg total) under the tongue every 5 (five) minutes as needed for chest pain. Take for up to 3 doses. Call physician. 100 tablet 3  . promethazine (PHENERGAN) 25 MG suppository Place 1 suppository (25 mg total) rectally every 6 (six) hours as needed for nausea or vomiting. 12 suppository 1  . sodium chloride 1 g tablet Take 1 tablet (1 g total) by mouth 2 (two) times daily with a meal.    . ticagrelor (BRILINTA) 90 MG TABS tablet Take 90 mg by mouth in the morning and at bedtime.      No current facility-administered medications on file prior to visit.    Cardiovascular & other pertient studies:  EKG 11/07/2019: Sinus rhythm 71 bpm. Old anterior infarct, occasional  PVC.   Echocardiogram 10/25/2019: 1. Normal LV systolic function; grade 1 diastolic dysfunction.  2. Left ventricular ejection fraction, by estimation, is 60 to 65%. The  left ventricle has normal function. The left ventricle has no regional  wall motion abnormalities. Left ventricular diastolic parameters are  consistent with Grade I diastolic  dysfunction (impaired relaxation).  3. Right ventricular systolic function is normal. The right ventricular  size is normal.  4. The mitral valve is normal in structure. Trivial mitral valve  regurgitation. No evidence of mitral stenosis.  5. The aortic valve is tricuspid. Aortic valve regurgitation is not  visualized. Mild aortic valve sclerosis is present, with no evidence of  aortic valve stenosis.  6. The inferior vena cava is normal in size with greater than 50%  respiratory variability, suggesting right atrial pressure of 3 mmHg.   Coronary angiogram/intervention 10/25/2019: LM: Distal 30%. dFR 1.0 LAD: Distal 30% stenosis Lcx: Severe tortuosity with moderate calcification.          Pros 60%. Mid 80-90% stenosis at LCx/OM         bifurcation ([place] 1,1,0) Ostial OM 40% stenosis.  RCA: Long diffuse prox-mid 80% stenosis.          Distal RCA 40% stenosis          PDA diffuse 60% stenosis.   Complex procedure due to severity or  tortuosity and calcification. Partially successful cutting balloon angioplasty prox-mid LCx Residual prox 40%, mid 40% stenosis at LCx/Om bifurcation, 60% calcific stenosis distal to bifurcation. This lesion remains inadequately prepared for stenting due to severe calcification.   Recommendation: Staging the procedure with consideration for femoral access and atherectomy before stenting, followed by PCI to RCA. Continue DAPT with Aspirin and Brilinta.   Recent labs: 02/18/2020: Glucose 103, BUN/Cr 9/0.94. EGFR >60. Na/K 139/4.4. Albumin 3.0. Rest of the CMP normal H/H 12/38. MCV 88. Platelets  272  10/26/2019: Glucose 107, BUN/Cr 5/0.88. EGFR >60. Na/K 130/4.3. Rest of the CMP normal H/H 12/35. MCV 83. Platelets 272 HbA1C 6.3% Chol 140, TG 50, HDL 49, LDL 81 TSH 2.7 normal    Review of Systems  HENT: Negative for ear pain (Left, along with discharge).   Cardiovascular: Negative for chest pain, dyspnea on exertion, leg swelling, palpitations and syncope.         Vitals:   05/09/20 1040  BP: 133/82  Pulse: 69  Resp: 17  SpO2: 100%     Body mass index is 18.75 kg/m. Filed Weights   05/09/20 1040  Weight: 146 lb (66.2 kg)     Objective:   Physical Exam Vitals and nursing note reviewed.  Constitutional:      Appearance: He is well-developed.  Neck:     Vascular: No JVD.  Cardiovascular:     Rate and Rhythm: Normal rate and regular rhythm.     Pulses: Intact distal pulses.          Dorsalis pedis pulses are 0 on the right side and 0 on the left side.       Posterior tibial pulses are 1+ on the right side and 1+ on the left side.     Heart sounds: Normal heart sounds. No murmur heard.   Pulmonary:     Effort: Pulmonary effort is normal.     Breath sounds: Normal breath sounds. No wheezing or rales.           Assessment & Recommendations:   64 y.o. African American male with hypertension, hyperlipidemia, tobacco dependence, CAD,   CAD: Partially successful LCx intervention (10/2019) No chest pain symptoms at this time. Continue medical management with DAPT will 3/20222, statin, metoprolol. Should he need repeat intervention in the future, he will require atherectomy.   Hypertension: Controlled  F/u in 6 months  Jeannette, MD [facility] Pager: [phone] Office: [phone]

## 2020-06-20 DIAGNOSIS — E875 Hyperkalemia: Secondary | ICD-10-CM | POA: Diagnosis not present

## 2020-06-20 DIAGNOSIS — Z6822 Body mass index (BMI) 22.0-22.9, adult: Secondary | ICD-10-CM | POA: Diagnosis not present

## 2020-06-20 DIAGNOSIS — K219 Gastro-esophageal reflux disease without esophagitis: Secondary | ICD-10-CM | POA: Diagnosis not present

## 2020-06-20 DIAGNOSIS — I10 Essential (primary) hypertension: Secondary | ICD-10-CM | POA: Diagnosis not present

## 2020-06-20 DIAGNOSIS — R638 Other symptoms and signs concerning food and fluid intake: Secondary | ICD-10-CM | POA: Diagnosis not present

## 2020-06-20 DIAGNOSIS — E1165 Type 2 diabetes mellitus with hyperglycemia: Secondary | ICD-10-CM | POA: Diagnosis not present

## 2020-06-20 DIAGNOSIS — E871 Hypo-osmolality and hyponatremia: Secondary | ICD-10-CM | POA: Diagnosis not present

## 2020-06-20 DIAGNOSIS — Z0000 Encounter for general adult medical examination without abnormal findings: Secondary | ICD-10-CM | POA: Diagnosis not present

## 2020-06-23 DIAGNOSIS — E871 Hypo-osmolality and hyponatremia: Secondary | ICD-10-CM | POA: Diagnosis not present

## 2020-06-23 DIAGNOSIS — Z0001 Encounter for general adult medical examination with abnormal findings: Secondary | ICD-10-CM | POA: Diagnosis not present

## 2020-06-23 DIAGNOSIS — E875 Hyperkalemia: Secondary | ICD-10-CM | POA: Diagnosis not present

## 2020-06-23 DIAGNOSIS — F17210 Nicotine dependence, cigarettes, uncomplicated: Secondary | ICD-10-CM | POA: Diagnosis not present

## 2020-06-23 DIAGNOSIS — I10 Essential (primary) hypertension: Secondary | ICD-10-CM | POA: Diagnosis not present

## 2020-07-24 DIAGNOSIS — H2513 Age-related nuclear cataract, bilateral: Secondary | ICD-10-CM | POA: Diagnosis not present

## 2020-07-24 DIAGNOSIS — H52223 Regular astigmatism, bilateral: Secondary | ICD-10-CM | POA: Diagnosis not present

## 2020-07-24 DIAGNOSIS — G510 Bell's palsy: Secondary | ICD-10-CM | POA: Diagnosis not present

## 2020-07-24 DIAGNOSIS — H5211 Myopia, right eye: Secondary | ICD-10-CM | POA: Diagnosis not present

## 2020-07-24 DIAGNOSIS — E119 Type 2 diabetes mellitus without complications: Secondary | ICD-10-CM | POA: Diagnosis not present

## 2020-08-01 DIAGNOSIS — I10 Essential (primary) hypertension: Secondary | ICD-10-CM | POA: Diagnosis not present

## 2020-08-01 DIAGNOSIS — E875 Hyperkalemia: Secondary | ICD-10-CM | POA: Diagnosis not present

## 2020-08-01 DIAGNOSIS — F17210 Nicotine dependence, cigarettes, uncomplicated: Secondary | ICD-10-CM | POA: Diagnosis not present

## 2020-10-24 DIAGNOSIS — E1165 Type 2 diabetes mellitus with hyperglycemia: Secondary | ICD-10-CM | POA: Diagnosis not present

## 2020-10-24 DIAGNOSIS — M545 Low back pain, unspecified: Secondary | ICD-10-CM | POA: Diagnosis not present

## 2020-10-24 DIAGNOSIS — R638 Other symptoms and signs concerning food and fluid intake: Secondary | ICD-10-CM | POA: Diagnosis not present

## 2020-10-24 DIAGNOSIS — Z6822 Body mass index (BMI) 22.0-22.9, adult: Secondary | ICD-10-CM | POA: Diagnosis not present

## 2020-10-24 DIAGNOSIS — R059 Cough, unspecified: Secondary | ICD-10-CM | POA: Diagnosis not present

## 2020-10-29 DIAGNOSIS — H6692 Otitis media, unspecified, left ear: Secondary | ICD-10-CM | POA: Diagnosis not present

## 2020-10-29 DIAGNOSIS — E875 Hyperkalemia: Secondary | ICD-10-CM | POA: Diagnosis not present

## 2020-10-29 DIAGNOSIS — R638 Other symptoms and signs concerning food and fluid intake: Secondary | ICD-10-CM | POA: Diagnosis not present

## 2020-10-29 DIAGNOSIS — I10 Essential (primary) hypertension: Secondary | ICD-10-CM | POA: Diagnosis not present

## 2020-10-29 DIAGNOSIS — E871 Hypo-osmolality and hyponatremia: Secondary | ICD-10-CM | POA: Diagnosis not present

## 2020-10-30 DIAGNOSIS — R059 Cough, unspecified: Secondary | ICD-10-CM | POA: Diagnosis not present

## 2020-10-30 DIAGNOSIS — H6692 Otitis media, unspecified, left ear: Secondary | ICD-10-CM | POA: Diagnosis not present

## 2020-10-30 DIAGNOSIS — I10 Essential (primary) hypertension: Secondary | ICD-10-CM | POA: Diagnosis not present

## 2020-10-30 DIAGNOSIS — E875 Hyperkalemia: Secondary | ICD-10-CM | POA: Diagnosis not present

## 2020-10-30 DIAGNOSIS — E871 Hypo-osmolality and hyponatremia: Secondary | ICD-10-CM | POA: Diagnosis not present

## 2020-10-30 DIAGNOSIS — F17210 Nicotine dependence, cigarettes, uncomplicated: Secondary | ICD-10-CM | POA: Diagnosis not present

## 2020-10-30 DIAGNOSIS — M545 Low back pain, unspecified: Secondary | ICD-10-CM | POA: Diagnosis not present

## 2020-10-30 DIAGNOSIS — I2510 Atherosclerotic heart disease of native coronary artery without angina pectoris: Secondary | ICD-10-CM | POA: Diagnosis not present

## 2020-10-30 DIAGNOSIS — R638 Other symptoms and signs concerning food and fluid intake: Secondary | ICD-10-CM | POA: Diagnosis not present

## 2020-11-06 ENCOUNTER — Other Ambulatory Visit: Payer: Self-pay | Admitting: Cardiology

## 2020-11-06 DIAGNOSIS — I2510 Atherosclerotic heart disease of native coronary artery without angina pectoris: Secondary | ICD-10-CM

## 2020-11-07 ENCOUNTER — Ambulatory Visit: Payer: Medicare Other | Admitting: Cardiology

## 2020-11-07 NOTE — Progress Notes (Signed)
Error

## 2020-11-30 DIAGNOSIS — E1165 Type 2 diabetes mellitus with hyperglycemia: Secondary | ICD-10-CM | POA: Diagnosis not present

## 2020-11-30 DIAGNOSIS — K219 Gastro-esophageal reflux disease without esophagitis: Secondary | ICD-10-CM | POA: Diagnosis not present

## 2020-12-17 ENCOUNTER — Other Ambulatory Visit: Payer: Self-pay

## 2020-12-17 ENCOUNTER — Ambulatory Visit: Payer: Medicare Other | Admitting: Cardiology

## 2020-12-17 ENCOUNTER — Encounter: Payer: Self-pay | Admitting: Cardiology

## 2020-12-17 VITALS — BP 148/70 | HR 53 | Temp 98.0°F | Resp 17 | Ht 74.0 in | Wt 147.0 lb

## 2020-12-17 DIAGNOSIS — I2510 Atherosclerotic heart disease of native coronary artery without angina pectoris: Secondary | ICD-10-CM

## 2020-12-17 DIAGNOSIS — I10 Essential (primary) hypertension: Secondary | ICD-10-CM

## 2020-12-17 MED ORDER — AMLODIPINE BESYLATE 5 MG PO TABS: 5.0000 mg | ORAL_TABLET | Freq: Every day | ORAL | 3 refills | Status: DC

## 2020-12-17 MED ORDER — CLOPIDOGREL BISULFATE 75 MG PO TABS: 75.0000 mg | ORAL_TABLET | Freq: Every day | ORAL | 3 refills | Status: DC

## 2020-12-17 NOTE — Progress Notes (Signed)
Follow up visit  Subjective:   Michael Rivas, male    DOB: [birth year], 65 y.o.   MRN: [id number]   HPI   Chief Complaint  Patient presents with  . Coronary Artery Disease  . Follow-up    6 month   65 y.o. African American male with hypertension, hyperlipidemia, tobacco dependence, CAD,   Patient denies any chest pain.  He continues to smoke.  Blood pressure is elevated today.  He has been using cane due to generalized weakness.  Current Outpatient Medications on File Prior to Visit  Medication Sig Dispense Refill  . aspirin EC 81 MG tablet Take 1 tablet (81 mg total) by mouth daily. Swallow whole. 90 tablet 3  . atorvastatin (LIPITOR) 80 MG tablet TAKE 1 TABLET BY MOUTH ONCE DAILY AT 6PM 90 tablet 0  . Ensure (ENSURE) Take 237 mLs by mouth daily.     Marland Kitchen lisinopril (ZESTRIL) 10 MG tablet Take 1 tablet (10 mg total) by mouth every evening. 90 tablet 3  . metoprolol tartrate (LOPRESSOR) 25 MG tablet Take 1 tablet (25 mg total) by mouth 2 (two) times daily. 180 tablet 3  . nitroGLYCERIN (NITROSTAT) 0.4 MG SL tablet Place 1 tablet (0.4 mg total) under the tongue every 5 (five) minutes as needed for chest pain. Take for up to 3 doses. Call physician. 100 tablet 3  . ofloxacin (FLOXIN) 0.3 % OTIC solution as needed.    . ticagrelor (BRILINTA) 90 MG TABS tablet Take 1 tablet (90 mg total) by mouth in the morning and at bedtime. 180 tablet 1   No current facility-administered medications on file prior to visit.    Cardiovascular & other pertient studies:  EKG 12/17/2020: Sinus rhythm 51 bpm  Left atrial enlargement Anteroseptal infarct -age undetermined  Echocardiogram 10/25/2019: 1. Normal LV systolic function; grade 1 diastolic dysfunction.  2. Left ventricular ejection fraction, by estimation, is 60 to 65%. The  left ventricle has normal function. The left ventricle has no regional  wall motion abnormalities. Left ventricular diastolic parameters are  consistent with Grade I  diastolic  dysfunction (impaired relaxation).  3. Right ventricular systolic function is normal. The right ventricular  size is normal.  4. The mitral valve is normal in structure. Trivial mitral valve  regurgitation. No evidence of mitral stenosis.  5. The aortic valve is tricuspid. Aortic valve regurgitation is not  visualized. Mild aortic valve sclerosis is present, with no evidence of  aortic valve stenosis.  6. The inferior vena cava is normal in size with greater than 50%  respiratory variability, suggesting right atrial pressure of 3 mmHg.   Coronary angiogram/intervention 10/25/2019: LM: Distal 30%. dFR 1.0 LAD: Distal 30% stenosis Lcx: Severe tortuosity with moderate calcification.          Pros 60%. Mid 80-90% stenosis at LCx/OM         bifurcation ([place] 1,1,0) Ostial OM 40% stenosis.  RCA: Long diffuse prox-mid 80% stenosis.          Distal RCA 40% stenosis          PDA diffuse 60% stenosis.   Complex procedure due to severity or tortuosity and calcification. Partially successful cutting balloon angioplasty prox-mid LCx Residual prox 40%, mid 40% stenosis at LCx/Om bifurcation, 60% calcific stenosis distal to bifurcation. This lesion remains inadequately prepared for stenting due to severe calcification.   Recommendation: Staging the procedure with consideration for femoral access and atherectomy before stenting, followed by PCI to RCA. Continue DAPT with Aspirin  and Brilinta.   Recent labs: 02/18/2020: Glucose 103, BUN/Cr 9/0.94. EGFR >60. Na/K 139/4.4. Albumin 3.0. Rest of the CMP normal H/H 12/38. MCV 88. Platelets 272  10/26/2019: Glucose 107, BUN/Cr 5/0.88. EGFR >60. Na/K 130/4.3. Rest of the CMP normal H/H 12/35. MCV 83. Platelets 272 HbA1C 6.3% Chol 140, TG 50, HDL 49, LDL 81 TSH 2.7 normal    Review of Systems  HENT: Negative for ear pain.   Cardiovascular: Negative for chest pain, dyspnea on exertion, leg swelling, palpitations and syncope.          Vitals:   12/17/20 1045  BP: (!) 148/70  Pulse: (!) 53  Resp: 17  Temp: 98 F (36.7 C)  SpO2: 98%     Body mass index is 18.87 kg/m. Filed Weights   12/17/20 1045  Weight: 147 lb (66.7 kg)     Objective:   Physical Exam Vitals and nursing note reviewed.  Constitutional:      Appearance: He is well-developed.  Neck:     Vascular: No JVD.  Cardiovascular:     Rate and Rhythm: Normal rate and regular rhythm.     Pulses: Intact distal pulses.          Dorsalis pedis pulses are 0 on the right side and 0 on the left side.       Posterior tibial pulses are 1+ on the right side and 1+ on the left side.     Heart sounds: Normal heart sounds. No murmur heard.   Pulmonary:     Effort: Pulmonary effort is normal.     Breath sounds: Normal breath sounds. No wheezing or rales.           Assessment & Recommendations:   65 y.o. African American male with hypertension, hyperlipidemia, tobacco dependence, CAD,   CAD: Partially successful LCx intervention (10/2019) No chest pain symptoms at this time.  Completed DAPT for 1 year.  Will switch to monotherapy with Plavix 75 mg daily, owing to residual CAD.   Continue statin, metoprolol.  Should he need repeat intervention in the future, he will require atherectomy.   Hypertension: Uncontrolled.  Added amlodipine 5 mg daily.  F/u in 3 months  Gotham, MD [facility] [place] Pager: [phone] Office: [phone]

## 2021-01-13 DIAGNOSIS — G510 Bell's palsy: Secondary | ICD-10-CM | POA: Diagnosis not present

## 2021-01-13 DIAGNOSIS — H7192 Unspecified cholesteatoma, left ear: Secondary | ICD-10-CM | POA: Diagnosis not present

## 2021-01-29 DIAGNOSIS — K219 Gastro-esophageal reflux disease without esophagitis: Secondary | ICD-10-CM | POA: Diagnosis not present

## 2021-01-29 DIAGNOSIS — E1165 Type 2 diabetes mellitus with hyperglycemia: Secondary | ICD-10-CM | POA: Diagnosis not present

## 2021-02-17 ENCOUNTER — Other Ambulatory Visit: Payer: Self-pay

## 2021-02-17 DIAGNOSIS — I2510 Atherosclerotic heart disease of native coronary artery without angina pectoris: Secondary | ICD-10-CM

## 2021-02-17 MED ORDER — ATORVASTATIN CALCIUM 80 MG PO TABS: ORAL_TABLET | ORAL | 3 refills | Status: DC

## 2021-03-01 DIAGNOSIS — K219 Gastro-esophageal reflux disease without esophagitis: Secondary | ICD-10-CM | POA: Diagnosis not present

## 2021-03-01 DIAGNOSIS — E1165 Type 2 diabetes mellitus with hyperglycemia: Secondary | ICD-10-CM | POA: Diagnosis not present

## 2021-03-19 ENCOUNTER — Ambulatory Visit: Payer: Medicare Other | Admitting: Cardiology

## 2021-03-26 ENCOUNTER — Encounter: Payer: Self-pay | Admitting: Cardiology

## 2021-03-26 ENCOUNTER — Ambulatory Visit: Payer: Medicare Other | Admitting: Cardiology

## 2021-03-26 ENCOUNTER — Other Ambulatory Visit: Payer: Self-pay

## 2021-03-26 VITALS — BP 102/65 | HR 69 | Temp 98.4°F | Resp 16 | Ht 72.0 in | Wt 142.0 lb

## 2021-03-26 DIAGNOSIS — I2510 Atherosclerotic heart disease of native coronary artery without angina pectoris: Secondary | ICD-10-CM

## 2021-03-26 DIAGNOSIS — I10 Essential (primary) hypertension: Secondary | ICD-10-CM

## 2021-03-26 NOTE — Progress Notes (Signed)
Follow up visit  Subjective:   Michael Rivas, male    DOB: March 08, 1956, 65 y.o.   MRN: [id number]   HPI   Chief Complaint  Patient presents with   Coronary artery disease involving native coronary artery of   Follow-up    3 month   65 y.o. African American male with hypertension, hyperlipidemia, tobacco dependence, CAD,   Patient denies any chest pain.  He continues to smoke.  Blood pressure is well controlled. He is compl;iant with medical therapy.  Current Outpatient Medications on File Prior to Visit  Medication Sig Dispense Refill   amLODipine (NORVASC) 5 MG tablet Take 1 tablet (5 mg total) by mouth daily. 90 tablet 3   atorvastatin (LIPITOR) 80 MG tablet TAKE 1 TABLET BY MOUTH ONCE DAILY AT 6PM 90 tablet 3   clopidogrel (PLAVIX) 75 MG tablet Take 1 tablet (75 mg total) by mouth daily. 90 tablet 3   Ensure (ENSURE) Take 237 mLs by mouth daily.      lisinopril (ZESTRIL) 10 MG tablet Take 1 tablet (10 mg total) by mouth every evening. 90 tablet 3   metoprolol tartrate (LOPRESSOR) 25 MG tablet Take 1 tablet (25 mg total) by mouth 2 (two) times daily. 180 tablet 3   nitroGLYCERIN (NITROSTAT) 0.4 MG SL tablet Place 1 tablet (0.4 mg total) under the tongue every 5 (five) minutes as needed for chest pain. Take for up to 3 doses. Call physician. 100 tablet 3   ofloxacin (FLOXIN) 0.3 % OTIC solution as needed.     No current facility-administered medications on file prior to visit.    Cardiovascular & other pertient studies:  EKG 12/17/2020: Sinus rhythm 51 bpm  Left atrial enlargement Anteroseptal infarct -age undetermined  Echocardiogram 10/25/2019:  1. Normal LV systolic function; grade 1 diastolic dysfunction.   2. Left ventricular ejection fraction, by estimation, is 60 to 65%. The  left ventricle has normal function. The left ventricle has no regional  wall motion abnormalities. Left ventricular diastolic parameters are  consistent with Grade I diastolic  dysfunction  (impaired relaxation).   3. Right ventricular systolic function is normal. The right ventricular  size is normal.   4. The mitral valve is normal in structure. Trivial mitral valve  regurgitation. No evidence of mitral stenosis.   5. The aortic valve is tricuspid. Aortic valve regurgitation is not  visualized. Mild aortic valve sclerosis is present, with no evidence of  aortic valve stenosis.   6. The inferior vena cava is normal in size with greater than 50%  respiratory variability, suggesting right atrial pressure of 3 mmHg.   Coronary angiogram/intervention 10/25/2019: LM: Distal 30%. dFR 1.0 LAD: Distal 30% stenosis Lcx: Severe tortuosity with moderate calcification.          Pros 60%. Mid 80-90% stenosis at LCx/OM         bifurcation ([place] 1,1,0) Ostial OM 40% stenosis.  RCA: Long diffuse prox-mid 80% stenosis.          Distal RCA 40% stenosis          PDA diffuse 60% stenosis.    Complex procedure due to severity or tortuosity and calcification. Partially successful cutting balloon angioplasty prox-mid LCx Residual prox 40%, mid 40% stenosis at LCx/Om bifurcation, 60% calcific stenosis distal to bifurcation. This lesion remains inadequately prepared for stenting due to severe calcification.    Recommendation: Staging the procedure with consideration for femoral access and atherectomy before stenting, followed by PCI to RCA. Continue DAPT with  Aspirin and Brilinta.    Recent labs: 10/24/2020: Glucose 107, BUN/Cr 8/0.8. EGFR 92.  HbA1C 6.6% Chol 110, TG 50, HDL 49, LDL 49  02/18/2020: Glucose 103, BUN/Cr 9/0.94. EGFR >60. Na/K 139/4.4. Albumin 3.0. Rest of the CMP normal H/H 12/38. MCV 88. Platelets 272  10/26/2019: Glucose 107, BUN/Cr 5/0.88. EGFR >60. Na/K 130/4.3. Rest of the CMP normal H/H 12/35. MCV 83. Platelets 272 HbA1C 6.3% Chol 140, TG 50, HDL 49, LDL 81 TSH 2.7 normal    Review of Systems  HENT:  Negative for ear pain.   Cardiovascular:  Negative for  chest pain, dyspnea on exertion, leg swelling, palpitations and syncope.        Vitals:   03/26/21 1142  BP: 102/65  Pulse: 69  Resp: 16  Temp: 98.4 F (36.9 C)  SpO2: 96%     Body mass index is 19.26 kg/m. There were no vitals filed for this visit.    Objective:   Physical Exam Vitals and nursing note reviewed.  Constitutional:      Appearance: He is well-developed.  Neck:     Vascular: No JVD.  Cardiovascular:     Rate and Rhythm: Normal rate and regular rhythm.     Pulses: Intact distal pulses.          Dorsalis pedis pulses are 0 on the right side and 0 on the left side.       Posterior tibial pulses are 1+ on the right side and 1+ on the left side.     Heart sounds: Normal heart sounds. No murmur heard. Pulmonary:     Effort: Pulmonary effort is normal.     Breath sounds: Normal breath sounds. No wheezing or rales.          Assessment & Recommendations:   65 y.o. African American male with hypertension, hyperlipidemia, tobacco dependence, CAD,   CAD: Partially successful LCx intervention (10/2019) No chest pain symptoms at this time.  Completed DAPT for 1 year.  Continue monotherapy with Plavix 75 mg daily, owing to residual CAD.   Continue statin, metoprolol.  Should he need repeat intervention in the future, he will require atherectomy.   Hypertension: Controlled.  F/u in 1 year  Nigel Mormon, MD [facility] Pager: [phone] Office: [phone]

## 2021-03-27 ENCOUNTER — Other Ambulatory Visit: Payer: Self-pay

## 2021-03-27 DIAGNOSIS — I2510 Atherosclerotic heart disease of native coronary artery without angina pectoris: Secondary | ICD-10-CM

## 2021-03-27 MED ORDER — LISINOPRIL 10 MG PO TABS: 10.0000 mg | ORAL_TABLET | Freq: Every evening | ORAL | 3 refills | Status: DC

## 2021-03-29 IMAGING — CT CT HEAD W/O CM
3 series · 14 of 47 positions shown, 16 images · non-contrast
Comparison: Temporal bone CT 06 07 2019

CLINICAL DATA: Ear pain and headache for several days, acutely
worsening

EXAM:
CT HEAD WITHOUT CONTRAST
TECHNIQUE: Contiguous axial images were obtained from the base of the skull
through the vertex without intravenous contrast.

[Series 2: head w o · axial · 0.45mm/px · z∈[+60,+185]mm · 8 of 30 slices shown, 10 images]
[im 3/30  brain]
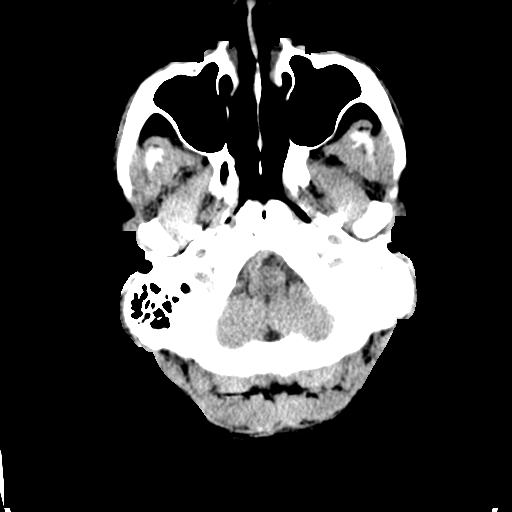
[im 3/30  bone]
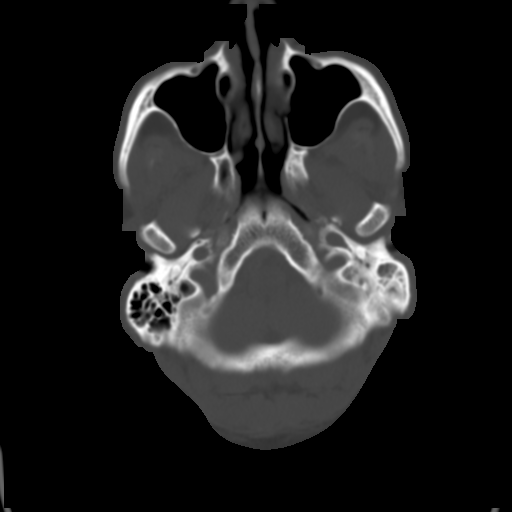
[im 7/30  brain]
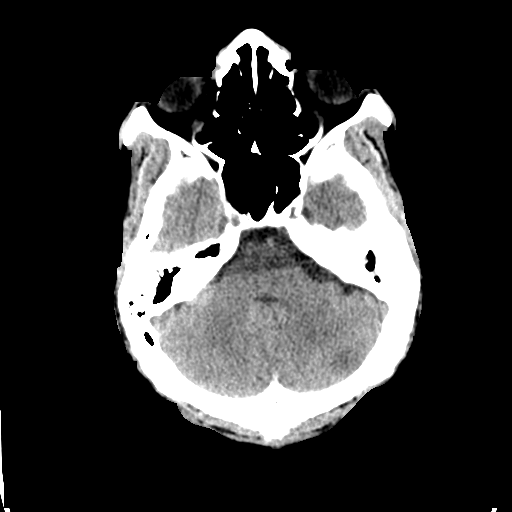
[im 10/30  brain]
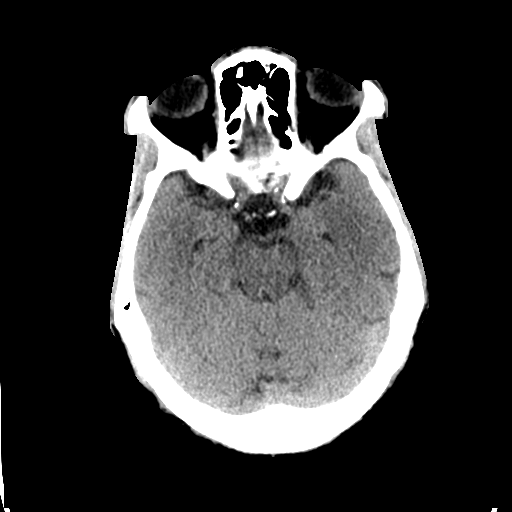
[im 14/30  brain]
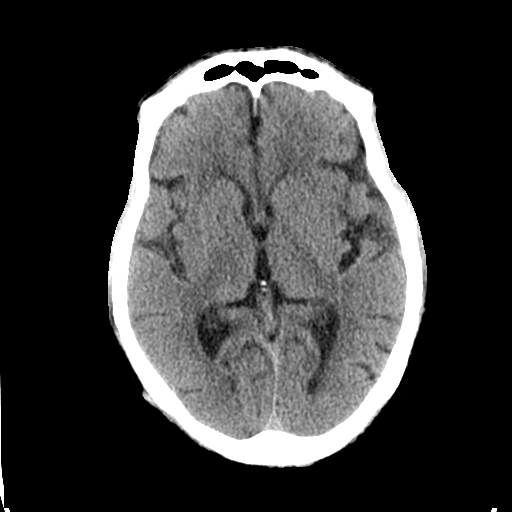
[im 17/30  brain]
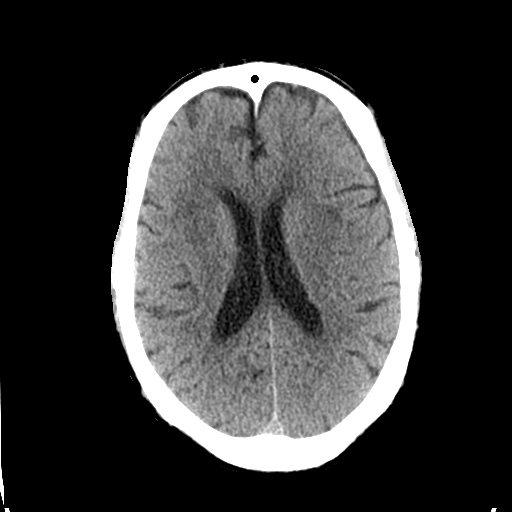
[im 17/30  bone]
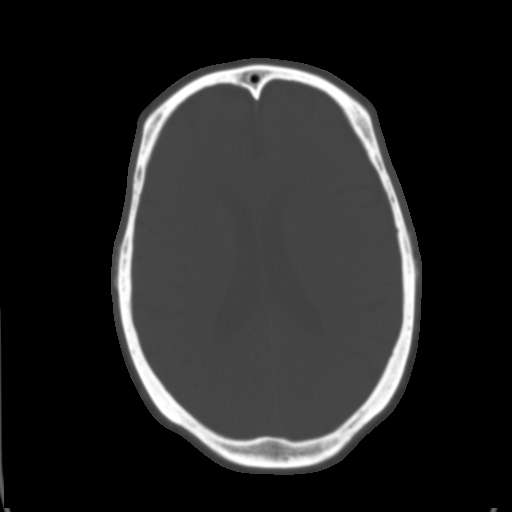
[im 21/30  brain]
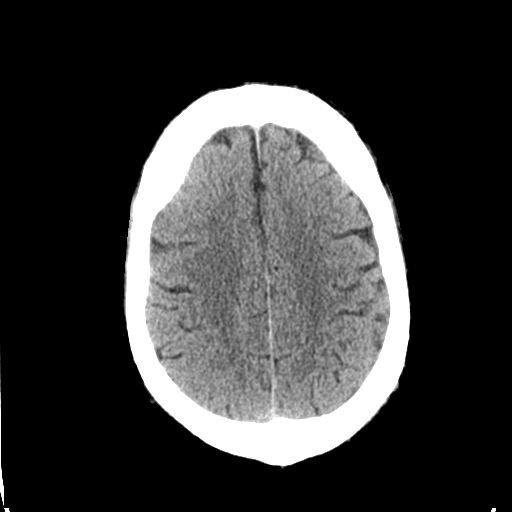
[im 24/30  brain]
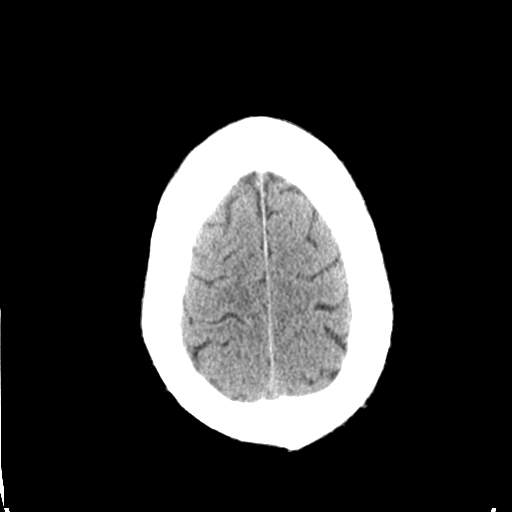
[im 28/30  brain]
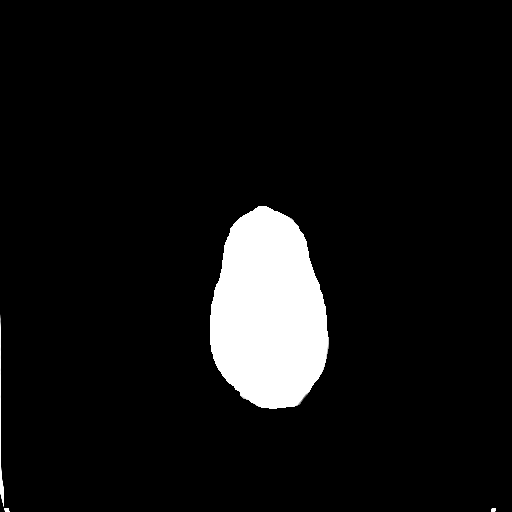

[Series 4: coronal soft · coronal · 0.28mm/px · 3 of 73 slices shown]
[im 25/73  brain]
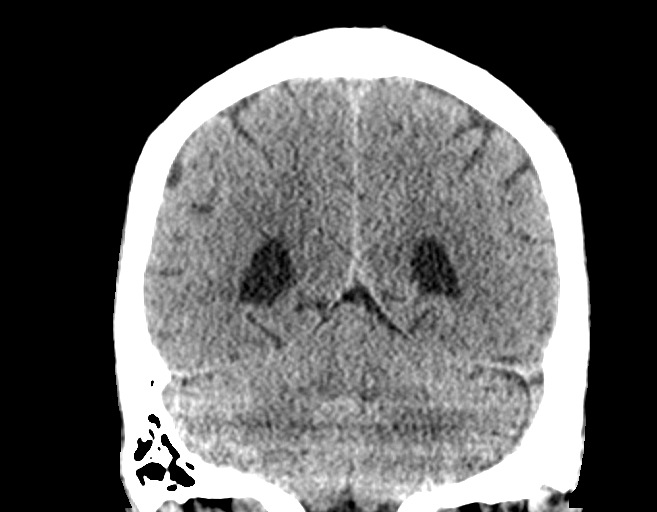
[im 33/73  brain]
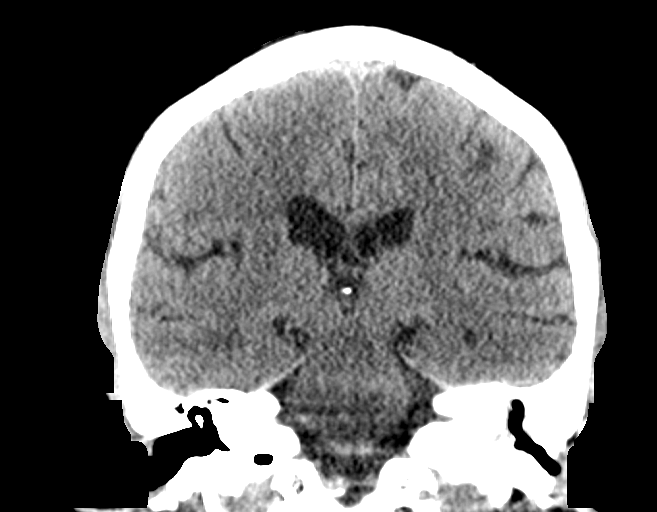
[im 41/73  brain]
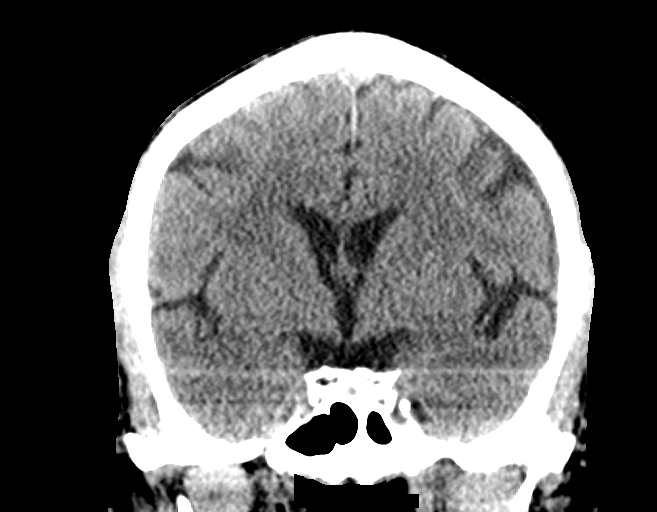

[Series 5: sagittal soft · sagittal · 0.31mm/px · 3 of 55 slices shown]
[im 19/55  brain]
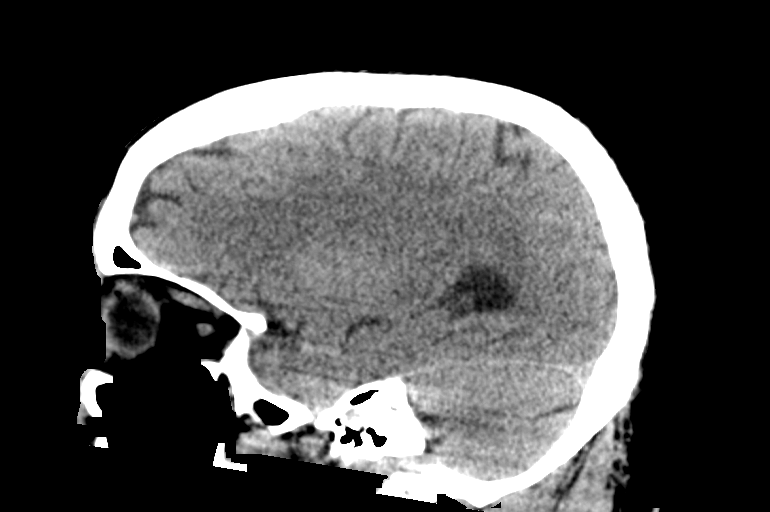
[im 28/55  brain]
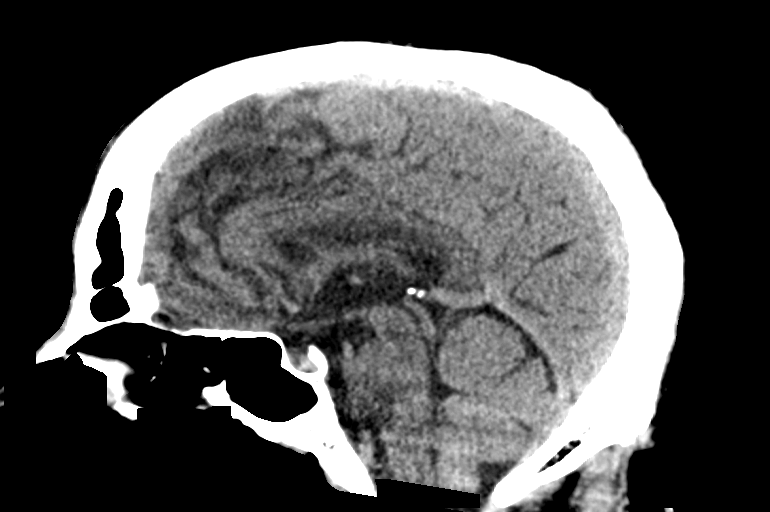
[im 37/55  brain]
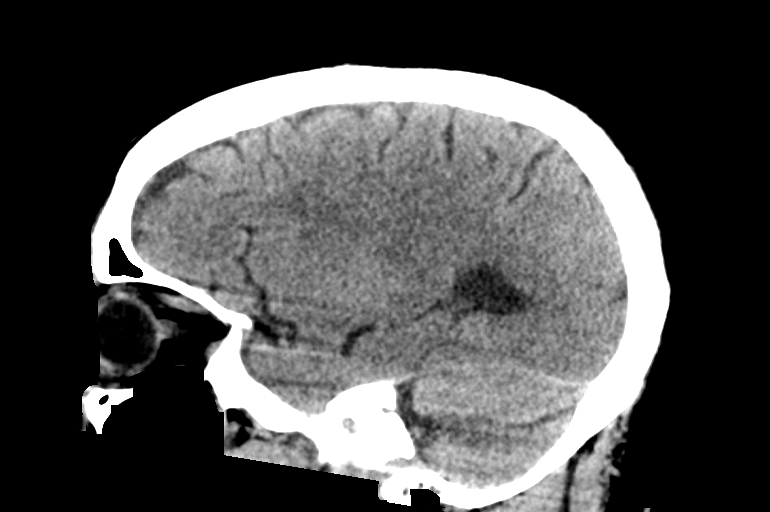

[14 of 47 positions shown; findings below may reference images not displayed]

FINDINGS: Brain: No evidence of acute infarction, hemorrhage, hydrocephalus,
extra-axial collection or mass lesion/mass effect.

Vascular: Atherosclerotic calcification of the carotid siphons. No
hyperdense vessel.

Skull: No calvarial fracture or suspicious osseous lesion. No scalp
swelling or hematoma.

Sinuses/Orbits: Extensive opacity seen throughout the left middle
ear cavity with complete opacification of the left mastoid air cells
and some hyperostotic changes favoring chronicity, with similar
findings to the comparison CT temporal bone 06/08/2019. There is
opacification of much of the attic of the middle ear cavity with
destructive changes of the ossicular chain as well as progressive
destructive changes along the left semi circular canals including a
questionable focus of dehiscence along sigmoid plate. There is
chronic dehiscence of the tympanic plate anteriorly along the
posterior fossa the temporomandibular joint. The tegmen tympani
appears grossly intact on these non tailored images. The right
mastoid air cells and paranasal sinuses are predominantly clear.
Included orbital structures are unremarkable.

Other: None
IMPRESSION: 1. No acute intracranial abnormality.
2. Extensive opacity throughout the left middle ear cavity
compatible with patient's known cholesteatoma.
3. Progressive destructive changes along the left ossicular chain.
4. Progressive destructive changes along the left semi circular
canals including a questionable focus of dehiscence along the
sigmoid plate.
5. Chronic dehiscence of the tympanic plate anteriorly along the
posterior fossa the temporomandibular joint.
6. Consider dedicated temporal bone CT for better interrogation of
the sigmoid dehiscence. This study may be reconstructed from these
images.

## 2021-04-01 DIAGNOSIS — E1165 Type 2 diabetes mellitus with hyperglycemia: Secondary | ICD-10-CM | POA: Diagnosis not present

## 2021-04-01 DIAGNOSIS — K219 Gastro-esophageal reflux disease without esophagitis: Secondary | ICD-10-CM | POA: Diagnosis not present

## 2021-04-07 DIAGNOSIS — R7301 Impaired fasting glucose: Secondary | ICD-10-CM | POA: Diagnosis not present

## 2021-04-07 DIAGNOSIS — I10 Essential (primary) hypertension: Secondary | ICD-10-CM | POA: Diagnosis not present

## 2021-04-20 DIAGNOSIS — I2510 Atherosclerotic heart disease of native coronary artery without angina pectoris: Secondary | ICD-10-CM | POA: Diagnosis not present

## 2021-04-20 DIAGNOSIS — R7303 Prediabetes: Secondary | ICD-10-CM | POA: Diagnosis not present

## 2021-04-20 DIAGNOSIS — E785 Hyperlipidemia, unspecified: Secondary | ICD-10-CM | POA: Diagnosis not present

## 2021-05-01 DIAGNOSIS — I10 Essential (primary) hypertension: Secondary | ICD-10-CM | POA: Diagnosis not present

## 2021-05-01 DIAGNOSIS — E1165 Type 2 diabetes mellitus with hyperglycemia: Secondary | ICD-10-CM | POA: Diagnosis not present

## 2021-05-05 DIAGNOSIS — Z0001 Encounter for general adult medical examination with abnormal findings: Secondary | ICD-10-CM | POA: Diagnosis not present

## 2021-05-05 DIAGNOSIS — Z23 Encounter for immunization: Secondary | ICD-10-CM | POA: Diagnosis not present

## 2021-05-21 ENCOUNTER — Encounter: Payer: Self-pay | Admitting: Orthopedic Surgery

## 2021-05-21 ENCOUNTER — Ambulatory Visit: Payer: Medicare Other

## 2021-05-21 ENCOUNTER — Other Ambulatory Visit: Payer: Self-pay

## 2021-05-21 ENCOUNTER — Ambulatory Visit (INDEPENDENT_AMBULATORY_CARE_PROVIDER_SITE_OTHER): Payer: Medicare Other | Admitting: Orthopedic Surgery

## 2021-05-21 VITALS — BP 133/74 | HR 70 | Ht 76.0 in | Wt 146.0 lb

## 2021-05-21 DIAGNOSIS — G8929 Other chronic pain: Secondary | ICD-10-CM

## 2021-05-21 DIAGNOSIS — M25561 Pain in right knee: Secondary | ICD-10-CM

## 2021-05-21 DIAGNOSIS — M170 Bilateral primary osteoarthritis of knee: Secondary | ICD-10-CM | POA: Diagnosis not present

## 2021-05-21 NOTE — Progress Notes (Signed)
NEW PROBLEM//OFFICE VISIT   Chief Complaint  Patient presents with   Knee Pain    Bilateral    65 YO MALE MI LAST YR TREATED WITH BALLOON ANGIOPLASTY NO STENT P/W BILATERAL KNEE PAIN FOR SEVERAL YRS, WORSENING OVER THE LAST YR. NSAIDS ARE REL CONTRAINDICATION DUE TO PLAVIX   C/O B KNEE PAIN MEDIAL JOINT    BP 133/74   Pulse 70   Ht 6\' 4"  (1.93 m)   Wt 146 lb (66.2 kg)   BMI 17.77 kg/m   Body mass index is 17.77 kg/m.  General appearance: Well-developed well-nourished no gross deformities  Cardiovascular normal pulse and perfusion normal color without edema  Neurologically no sensation loss or deficits or pathologic reflexes  Psychological: Awake alert and oriented x3 mood and affect normal  Skin no lacerations or ulcerations no nodularity no palpable masses, no erythema or nodularity  Musculoskeletal:  BOTH KNEES   MEDIAL TENDERNESS NO EFFUSION SKIN NORMAL ROM 125  INSTAB NONE  MOTOR 5/5   Review of Systems  Constitutional:  Negative for fever.  Cardiovascular:  Negative for chest pain.  Skin:  Negative for rash.    Past Medical History:  Diagnosis Date   Bell's palsy 01/07/2020   Coronary artery disease    Heart attack (HCC) 10/25/2019   Hypertension 2012   Impaired glucose tolerance    Migraine    NSTEMI (non-ST elevated myocardial infarction) [facility])    Pulmonary nodule    6 mm RML pulmonary nodule on 10/24/19 CTA    Past Surgical History:  Procedure Laterality Date   BACK SURGERY  2016   CHOLECYSTECTOMY  2007   CORONARY ANGIOPLASTY  10/25/2019   CORONARY BALLOON ANGIOPLASTY   CORONARY BALLOON ANGIOPLASTY N/A 10/25/2019   Procedure: CORONARY BALLOON ANGIOPLASTY;  Surgeon: 10/27/2019, MD;  Location: [facility];  Service: Cardiovascular;  Laterality: N/A;   INTRAVASCULAR PRESSURE WIRE/FFR STUDY N/A 10/25/2019   Procedure: INTRAVASCULAR PRESSURE WIRE/FFR STUDY;  Surgeon: 10/27/2019, MD;  Location: [facility];   Service: Cardiovascular;  Laterality: N/A;   INTRAVASCULAR ULTRASOUND/IVUS N/A 10/25/2019   Procedure: Intravascular Ultrasound/IVUS;  Surgeon: 10/27/2019, MD;  Location: [facility];  Service: Cardiovascular;  Laterality: N/A;   LEFT HEART CATH AND CORONARY ANGIOGRAPHY N/A 10/25/2019   Procedure: LEFT HEART CATH AND CORONARY ANGIOGRAPHY;  Surgeon: 10/27/2019, MD;  Location: [facility];  Service: Cardiovascular;  Laterality: N/A;   TYMPANOMASTOIDECTOMY WITH RECONSTRUCTION Left 02/20/2020   Procedure: TYMPANOMASTOIDECTOMY WITH RECONSTRUCTION;  Surgeon: 02/22/2020, MD;  Location: [facility] OR;  Service: ENT;  Laterality: Left;    Family History  Problem Relation Age of Onset   Stroke Mother    Diabetes Mother    Hypertension Mother    Heart failure Father    Heart attack Father    Diabetes Maternal Aunt    Heart attack Maternal Aunt    Stroke Maternal Uncle    Stroke Paternal Uncle    Stroke Maternal Uncle    Heart attack Paternal Uncle    Hypertension Sister    Hypertension Brother    Social History   Tobacco Use   Smoking status: Every Day    Packs/day: 0.50    Years: 45.00    Pack years: 22.50    Types: Cigars, Cigarettes   Smokeless tobacco: Never   Tobacco comments:    smokes 2-3 cigars/daily.  former cigarette smoker , quit 2016, 0.5 ppd  Vaping Use  Vaping Use: Never used  Substance Use Topics   Alcohol use: Not Currently    Alcohol/week: 35.0 standard drinks    Types: 35 Cans of beer per week   Drug use: No    No Known Allergies  Current Meds  Medication Sig   amLODipine (NORVASC) 5 MG tablet Take 1 tablet (5 mg total) by mouth daily.   atorvastatin (LIPITOR) 80 MG tablet TAKE 1 TABLET BY MOUTH ONCE DAILY AT 6PM   clopidogrel (PLAVIX) 75 MG tablet Take 1 tablet (75 mg total) by mouth daily.   Ensure (ENSURE) Take 237 mLs by mouth daily.    lisinopril (ZESTRIL) 10 MG tablet Take 1 tablet (10 mg total) by mouth every evening.    metoprolol tartrate (LOPRESSOR) 25 MG tablet Take 1 tablet (25 mg total) by mouth 2 (two) times daily.   ofloxacin (FLOXIN) 0.3 % OTIC solution as needed.     MEDICAL DECISION MAKING  A.  Encounter Diagnoses  Name Primary?   Bilateral chronic knee pain    Primary osteoarthritis of both knees Yes    B. DATA ANALYSED:   IMAGING: Interpretation of images: I have personally reviewed the images and my interpretation is INTERNAL  MILD OA MEDIAL JOINT B KNEES   Orders: NONE   Outside records reviewed: NONE    C. MANAGEMENT   INJECTIONS   Procedure note  Injection  Verbal consent was obtained to inject the  RIGHT KNEE   Timeout procedure was completed to confirm injection site  Diagnosis OA   Medications used CELESTONE  Lidocaine 1% plain 3 cc  Anesthesia was provided by ethyl chloride spray  Prep was performed with alcohol  Technique of injection  90 FLEXION LATERAL PORTAL    No complications were noted   Procedure note  Injection  Verbal consent was obtained to inject the  LEFT KNEE   Timeout procedure was completed to confirm injection site  Diagnosis OA   Medications used CELESTONE  Lidocaine 1% plain 3 cc  Anesthesia was provided by ethyl chloride spray  Prep was performed with alcohol  Technique of injection  90 FLEXION LATERAL PORTAL    No complications were noted   No orders of the defined types were placed in this encounter.    Fuller Canada, MD  05/21/2021 10:54 AM

## 2021-06-01 DIAGNOSIS — K219 Gastro-esophageal reflux disease without esophagitis: Secondary | ICD-10-CM | POA: Diagnosis not present

## 2021-06-01 DIAGNOSIS — E1165 Type 2 diabetes mellitus with hyperglycemia: Secondary | ICD-10-CM | POA: Diagnosis not present

## 2021-07-01 DIAGNOSIS — E1165 Type 2 diabetes mellitus with hyperglycemia: Secondary | ICD-10-CM | POA: Diagnosis not present

## 2021-07-01 DIAGNOSIS — K219 Gastro-esophageal reflux disease without esophagitis: Secondary | ICD-10-CM | POA: Diagnosis not present

## 2021-08-20 DIAGNOSIS — E785 Hyperlipidemia, unspecified: Secondary | ICD-10-CM | POA: Diagnosis not present

## 2021-08-25 DIAGNOSIS — E785 Hyperlipidemia, unspecified: Secondary | ICD-10-CM | POA: Diagnosis not present

## 2021-08-25 DIAGNOSIS — E119 Type 2 diabetes mellitus without complications: Secondary | ICD-10-CM | POA: Diagnosis not present

## 2021-08-25 DIAGNOSIS — I2510 Atherosclerotic heart disease of native coronary artery without angina pectoris: Secondary | ICD-10-CM | POA: Diagnosis not present

## 2021-11-30 DIAGNOSIS — E119 Type 2 diabetes mellitus without complications: Secondary | ICD-10-CM | POA: Diagnosis not present

## 2021-11-30 DIAGNOSIS — I2510 Atherosclerotic heart disease of native coronary artery without angina pectoris: Secondary | ICD-10-CM | POA: Diagnosis not present

## 2021-12-08 DIAGNOSIS — M25561 Pain in right knee: Secondary | ICD-10-CM | POA: Diagnosis not present

## 2021-12-08 DIAGNOSIS — Z1211 Encounter for screening for malignant neoplasm of colon: Secondary | ICD-10-CM | POA: Diagnosis not present

## 2021-12-08 DIAGNOSIS — I700 Atherosclerosis of aorta: Secondary | ICD-10-CM | POA: Diagnosis not present

## 2021-12-08 DIAGNOSIS — E871 Hypo-osmolality and hyponatremia: Secondary | ICD-10-CM | POA: Diagnosis not present

## 2021-12-08 DIAGNOSIS — E785 Hyperlipidemia, unspecified: Secondary | ICD-10-CM | POA: Diagnosis not present

## 2021-12-08 DIAGNOSIS — M25562 Pain in left knee: Secondary | ICD-10-CM | POA: Diagnosis not present

## 2021-12-08 DIAGNOSIS — I2510 Atherosclerotic heart disease of native coronary artery without angina pectoris: Secondary | ICD-10-CM | POA: Diagnosis not present

## 2021-12-08 DIAGNOSIS — E119 Type 2 diabetes mellitus without complications: Secondary | ICD-10-CM | POA: Diagnosis not present

## 2021-12-11 ENCOUNTER — Other Ambulatory Visit: Payer: Self-pay | Admitting: Cardiology

## 2021-12-11 DIAGNOSIS — I10 Essential (primary) hypertension: Secondary | ICD-10-CM

## 2021-12-11 DIAGNOSIS — I2510 Atherosclerotic heart disease of native coronary artery without angina pectoris: Secondary | ICD-10-CM

## 2021-12-17 DIAGNOSIS — Z1211 Encounter for screening for malignant neoplasm of colon: Secondary | ICD-10-CM | POA: Diagnosis not present

## 2021-12-26 LAB — COLOGUARD: COLOGUARD: NEGATIVE

## 2022-01-07 ENCOUNTER — Other Ambulatory Visit: Payer: Self-pay | Admitting: Cardiology

## 2022-01-07 DIAGNOSIS — I10 Essential (primary) hypertension: Secondary | ICD-10-CM

## 2022-01-07 DIAGNOSIS — I2510 Atherosclerotic heart disease of native coronary artery without angina pectoris: Secondary | ICD-10-CM

## 2022-01-20 ENCOUNTER — Emergency Department (HOSPITAL_COMMUNITY): Payer: Medicare Other

## 2022-01-20 ENCOUNTER — Encounter (HOSPITAL_COMMUNITY): Payer: Self-pay

## 2022-01-20 ENCOUNTER — Emergency Department (HOSPITAL_COMMUNITY)
Admission: EM | Admit: 2022-01-20 | Discharge: 2022-01-20 | Disposition: A | Discharge status: Home / Self Care | Payer: Medicare Other | Attending: Emergency Medicine | Admitting: Emergency Medicine

## 2022-01-20 ENCOUNTER — Other Ambulatory Visit: Payer: Self-pay

## 2022-01-20 DIAGNOSIS — D649 Anemia, unspecified: Secondary | ICD-10-CM | POA: Diagnosis not present

## 2022-01-20 DIAGNOSIS — M25511 Pain in right shoulder: Secondary | ICD-10-CM | POA: Insufficient documentation

## 2022-01-20 DIAGNOSIS — Z7902 Long term (current) use of antithrombotics/antiplatelets: Secondary | ICD-10-CM | POA: Diagnosis not present

## 2022-01-20 DIAGNOSIS — M19011 Primary osteoarthritis, right shoulder: Secondary | ICD-10-CM | POA: Diagnosis not present

## 2022-01-20 DIAGNOSIS — M542 Cervicalgia: Secondary | ICD-10-CM | POA: Insufficient documentation

## 2022-01-20 DIAGNOSIS — Y92410 Unspecified street and highway as the place of occurrence of the external cause: Secondary | ICD-10-CM | POA: Diagnosis not present

## 2022-01-20 MED ORDER — METHOCARBAMOL 500 MG PO TABS: 500.0000 mg | ORAL_TABLET | Freq: Two times a day (BID) | ORAL | 0 refills | Status: DC
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
## 2022-01-20 NOTE — Discharge Instructions (Signed)
Your work-up today was reassuring.  X-ray of your shoulder and CT scan of the neck did not show any concerning findings.  You likely have a muscle strain of your neck.  I have sent muscle relaxer into the pharmacy.  After taking this medication please do not drive, operate heavy machinery, or do anything dangerous.  If you notice any new muscle aches or soreness please take Tylenol or ibuprofen for your symptoms.  If you have any worsening symptoms please return to the emergency room for evaluation.
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
## 2022-01-29 DIAGNOSIS — E785 Hyperlipidemia, unspecified: Secondary | ICD-10-CM | POA: Diagnosis not present

## 2022-01-29 DIAGNOSIS — I2510 Atherosclerotic heart disease of native coronary artery without angina pectoris: Secondary | ICD-10-CM | POA: Diagnosis not present

## 2022-03-01 DIAGNOSIS — I2510 Atherosclerotic heart disease of native coronary artery without angina pectoris: Secondary | ICD-10-CM | POA: Diagnosis not present

## 2022-03-01 DIAGNOSIS — E785 Hyperlipidemia, unspecified: Secondary | ICD-10-CM | POA: Diagnosis not present

## 2022-03-26 ENCOUNTER — Ambulatory Visit: Payer: Medicare Other | Admitting: Cardiology

## 2022-03-31 ENCOUNTER — Encounter: Payer: Self-pay | Admitting: Cardiology

## 2022-03-31 ENCOUNTER — Ambulatory Visit: Payer: Medicare Other | Admitting: Cardiology

## 2022-03-31 VITALS — BP 111/75 | HR 74 | Temp 98.0°F | Resp 15 | Ht 76.0 in | Wt 136.0 lb

## 2022-03-31 DIAGNOSIS — I2510 Atherosclerotic heart disease of native coronary artery without angina pectoris: Secondary | ICD-10-CM | POA: Diagnosis not present

## 2022-03-31 MED ORDER — ASPIRIN 81 MG PO TBEC: 81.0000 mg | DELAYED_RELEASE_TABLET | Freq: Every day | ORAL | 3 refills | Status: DC

## 2022-03-31 NOTE — Progress Notes (Signed)
Follow up visit  Subjective:   Michael Rivas, male    DOB: 11-Dec-1955, 66 y.o.   MRN: [id number]   HPI   Chief Complaint  Patient presents with   Coronary Artery Disease   Follow-up    1 year   66 y.o. African American male with hypertension, hyperlipidemia, tobacco dependence, CAD,   Patient denies any chest pain.  He has recently lost weight.   Current Outpatient Medications:    amLODipine (NORVASC) 5 MG tablet, Take 1 tablet by mouth once daily, Disp: 90 tablet, Rfl: 0   atorvastatin (LIPITOR) 80 MG tablet, TAKE 1 TABLET BY MOUTH ONCE DAILY AT 6PM, Disp: 90 tablet, Rfl: 0   clopidogrel (PLAVIX) 75 MG tablet, Take 1 tablet by mouth once daily, Disp: 90 tablet, Rfl: 0   Ensure (ENSURE), Take 237 mLs by mouth daily. , Disp: , Rfl:    lisinopril (ZESTRIL) 10 MG tablet, TAKE 1 TABLET BY MOUTH ONCE DAILY IN THE EVENING, Disp: 90 tablet, Rfl: 0   methocarbamol (ROBAXIN) 500 MG tablet, Take 1 tablet (500 mg total) by mouth 2 (two) times daily., Disp: 20 tablet, Rfl: 0   metoprolol tartrate (LOPRESSOR) 25 MG tablet, Take 1 tablet (25 mg total) by mouth 2 (two) times daily., Disp: 180 tablet, Rfl: 3   nitroGLYCERIN (NITROSTAT) 0.4 MG SL tablet, Place 1 tablet (0.4 mg total) under the tongue every 5 (five) minutes as needed for chest pain. Take for up to 3 doses. Call physician., Disp: 100 tablet, Rfl: 3   ofloxacin (FLOXIN) 0.3 % OTIC solution, as needed., Disp: , Rfl:     Cardiovascular & other pertient studies:  EKG 03/31/2022: Sinus rhythm 66 bpm  Old anterior infarct. Nonspecific T-abnormality  Echocardiogram 10/25/2019:  1. Normal LV systolic function; grade 1 diastolic dysfunction.   2. Left ventricular ejection fraction, by estimation, is 60 to 65%. The  left ventricle has normal function. The left ventricle has no regional  wall motion abnormalities. Left ventricular diastolic parameters are  consistent with Grade I diastolic  dysfunction (impaired relaxation).   3.  Right ventricular systolic function is normal. The right ventricular  size is normal.   4. The mitral valve is normal in structure. Trivial mitral valve  regurgitation. No evidence of mitral stenosis.   5. The aortic valve is tricuspid. Aortic valve regurgitation is not  visualized. Mild aortic valve sclerosis is present, with no evidence of  aortic valve stenosis.   6. The inferior vena cava is normal in size with greater than 50%  respiratory variability, suggesting right atrial pressure of 3 mmHg.   Coronary angiogram/intervention 10/25/2019: LM: Distal 30%. dFR 1.0 LAD: Distal 30% stenosis Lcx: Severe tortuosity with moderate calcification.          Pros 60%. Mid 80-90% stenosis at LCx/OM         bifurcation ([place] 1,1,0) Ostial OM 40% stenosis.  RCA: Long diffuse prox-mid 80% stenosis.          Distal RCA 40% stenosis          PDA diffuse 60% stenosis.    Complex procedure due to severity or tortuosity and calcification. Partially successful cutting balloon angioplasty prox-mid LCx Residual prox 40%, mid 40% stenosis at LCx/Om bifurcation, 60% calcific stenosis distal to bifurcation. This lesion remains inadequately prepared for stenting due to severe calcification.    Recommendation: Staging the procedure with consideration for femoral access and atherectomy before stenting, followed by PCI to RCA. Continue DAPT with Aspirin  and Brilinta.    Recent labs: 10/24/2020: Glucose 107, BUN/Cr 8/0.8. EGFR 92.  HbA1C 6.6% Chol 110, TG 50, HDL 49, LDL 49  02/18/2020: Glucose 103, BUN/Cr 9/0.94. EGFR >60. Na/K 139/4.4. Albumin 3.0. Rest of the CMP normal H/H 12/38. MCV 88. Platelets 272  10/26/2019: Glucose 107, BUN/Cr 5/0.88. EGFR >60. Na/K 130/4.3. Rest of the CMP normal H/H 12/35. MCV 83. Platelets 272 HbA1C 6.3% Chol 140, TG 50, HDL 49, LDL 81 TSH 2.7 normal    Review of Systems  HENT:  Negative for ear pain.   Cardiovascular:  Negative for chest pain, dyspnea on  exertion, leg swelling, palpitations and syncope.         Vitals:   03/31/22 1414  BP: 111/75  Pulse: 74  Resp: 15  Temp: 98 F (36.7 C)  SpO2: 98%     Body mass index is 16.55 kg/m. Filed Weights   03/31/22 1414  Weight: 136 lb (61.7 kg)     Objective:   Physical Exam Vitals and nursing note reviewed.  Constitutional:      Appearance: He is well-developed.  Neck:     Vascular: No JVD.  Cardiovascular:     Rate and Rhythm: Normal rate and regular rhythm.     Pulses: Intact distal pulses.          Dorsalis pedis pulses are 0 on the right side and 0 on the left side.       Posterior tibial pulses are 1+ on the right side and 1+ on the left side.     Heart sounds: Normal heart sounds. No murmur heard. Pulmonary:     Effort: Pulmonary effort is normal.     Breath sounds: Normal breath sounds. No wheezing or rales.          Assessment & Recommendations:   66 y.o. African American male with hypertension, hyperlipidemia, tobacco dependence, CAD,   CAD: Partially successful LCx intervention (10/2019) No chest pain symptoms at this time.  Change plavix to Aspirin 81 mg daily. Continue statin, metoprolol.  Should he need repeat intervention in the future, he will require atherectomy.   Hypertension: Controlled.  F/u in 1 year  Nigel Mormon, MD [facility]. [place] Pager: [phone] Office: [phone]

## 2022-04-02 ENCOUNTER — Ambulatory Visit: Payer: Medicare Other | Admitting: Cardiology

## 2022-06-07 ENCOUNTER — Other Ambulatory Visit: Payer: Self-pay | Admitting: Cardiology

## 2022-06-07 DIAGNOSIS — I2510 Atherosclerotic heart disease of native coronary artery without angina pectoris: Secondary | ICD-10-CM

## 2022-06-08 DIAGNOSIS — E785 Hyperlipidemia, unspecified: Secondary | ICD-10-CM | POA: Diagnosis not present

## 2022-06-08 DIAGNOSIS — E119 Type 2 diabetes mellitus without complications: Secondary | ICD-10-CM | POA: Diagnosis not present

## 2022-06-09 ENCOUNTER — Other Ambulatory Visit: Payer: Self-pay | Admitting: Cardiology

## 2022-06-09 DIAGNOSIS — I10 Essential (primary) hypertension: Secondary | ICD-10-CM

## 2022-06-17 ENCOUNTER — Other Ambulatory Visit (HOSPITAL_COMMUNITY): Payer: Self-pay | Admitting: Family Medicine

## 2022-06-17 DIAGNOSIS — Z23 Encounter for immunization: Secondary | ICD-10-CM | POA: Diagnosis not present

## 2022-06-17 DIAGNOSIS — E785 Hyperlipidemia, unspecified: Secondary | ICD-10-CM | POA: Diagnosis not present

## 2022-06-17 DIAGNOSIS — F17200 Nicotine dependence, unspecified, uncomplicated: Secondary | ICD-10-CM | POA: Diagnosis not present

## 2022-06-17 DIAGNOSIS — M25561 Pain in right knee: Secondary | ICD-10-CM | POA: Diagnosis not present

## 2022-06-17 DIAGNOSIS — R634 Abnormal weight loss: Secondary | ICD-10-CM | POA: Diagnosis not present

## 2022-06-17 DIAGNOSIS — E871 Hypo-osmolality and hyponatremia: Secondary | ICD-10-CM | POA: Diagnosis not present

## 2022-06-17 DIAGNOSIS — Z122 Encounter for screening for malignant neoplasm of respiratory organs: Secondary | ICD-10-CM | POA: Diagnosis not present

## 2022-06-17 DIAGNOSIS — I2510 Atherosclerotic heart disease of native coronary artery without angina pectoris: Secondary | ICD-10-CM | POA: Diagnosis not present

## 2022-06-17 DIAGNOSIS — E119 Type 2 diabetes mellitus without complications: Secondary | ICD-10-CM | POA: Diagnosis not present

## 2022-06-17 DIAGNOSIS — I700 Atherosclerosis of aorta: Secondary | ICD-10-CM | POA: Diagnosis not present

## 2022-06-17 DIAGNOSIS — M25562 Pain in left knee: Secondary | ICD-10-CM | POA: Diagnosis not present

## 2022-06-19 ENCOUNTER — Other Ambulatory Visit: Payer: Self-pay | Admitting: Cardiology

## 2022-06-19 DIAGNOSIS — I2510 Atherosclerotic heart disease of native coronary artery without angina pectoris: Secondary | ICD-10-CM

## 2022-07-29 ENCOUNTER — Ambulatory Visit (HOSPITAL_COMMUNITY)
Admission: RE | Admit: 2022-07-29 | Discharge: 2022-07-29 | Disposition: A | Discharge status: Home / Self Care | Payer: Medicare Other | Source: Ambulatory Visit | Attending: Family Medicine | Admitting: Family Medicine

## 2022-07-29 DIAGNOSIS — F17210 Nicotine dependence, cigarettes, uncomplicated: Secondary | ICD-10-CM | POA: Diagnosis not present

## 2022-07-29 DIAGNOSIS — J432 Centrilobular emphysema: Secondary | ICD-10-CM | POA: Diagnosis not present

## 2022-07-29 DIAGNOSIS — Z122 Encounter for screening for malignant neoplasm of respiratory organs: Secondary | ICD-10-CM | POA: Diagnosis not present

## 2022-07-29 DIAGNOSIS — I700 Atherosclerosis of aorta: Secondary | ICD-10-CM | POA: Insufficient documentation

## 2022-07-29 DIAGNOSIS — I2510 Atherosclerotic heart disease of native coronary artery without angina pectoris: Secondary | ICD-10-CM | POA: Diagnosis not present

## 2022-08-18 ENCOUNTER — Other Ambulatory Visit: Payer: Self-pay

## 2022-08-18 DIAGNOSIS — I2510 Atherosclerotic heart disease of native coronary artery without angina pectoris: Secondary | ICD-10-CM

## 2022-08-18 MED ORDER — METOPROLOL TARTRATE 25 MG PO TABS: 25.0000 mg | ORAL_TABLET | Freq: Two times a day (BID) | ORAL | 3 refills | Status: DC

## 2022-09-03 ENCOUNTER — Other Ambulatory Visit: Payer: Self-pay | Admitting: Cardiology

## 2022-09-03 DIAGNOSIS — I10 Essential (primary) hypertension: Secondary | ICD-10-CM

## 2022-09-04 ENCOUNTER — Other Ambulatory Visit: Payer: Self-pay | Admitting: Cardiology

## 2022-09-04 DIAGNOSIS — I2510 Atherosclerotic heart disease of native coronary artery without angina pectoris: Secondary | ICD-10-CM

## 2022-11-29 ENCOUNTER — Other Ambulatory Visit: Payer: Self-pay | Admitting: Cardiology

## 2022-11-29 DIAGNOSIS — I10 Essential (primary) hypertension: Secondary | ICD-10-CM

## 2022-11-30 ENCOUNTER — Other Ambulatory Visit: Payer: Self-pay | Admitting: Cardiology

## 2022-11-30 DIAGNOSIS — I2510 Atherosclerotic heart disease of native coronary artery without angina pectoris: Secondary | ICD-10-CM

## 2022-12-03 DIAGNOSIS — H7192 Unspecified cholesteatoma, left ear: Secondary | ICD-10-CM | POA: Diagnosis not present

## 2022-12-03 DIAGNOSIS — G510 Bell's palsy: Secondary | ICD-10-CM | POA: Diagnosis not present

## 2022-12-14 ENCOUNTER — Other Ambulatory Visit: Payer: Self-pay | Admitting: Cardiology

## 2022-12-14 DIAGNOSIS — I2510 Atherosclerotic heart disease of native coronary artery without angina pectoris: Secondary | ICD-10-CM

## 2022-12-17 DIAGNOSIS — Z0000 Encounter for general adult medical examination without abnormal findings: Secondary | ICD-10-CM | POA: Diagnosis not present

## 2022-12-20 DIAGNOSIS — R634 Abnormal weight loss: Secondary | ICD-10-CM | POA: Diagnosis not present

## 2022-12-20 DIAGNOSIS — F17200 Nicotine dependence, unspecified, uncomplicated: Secondary | ICD-10-CM | POA: Diagnosis not present

## 2022-12-20 DIAGNOSIS — I700 Atherosclerosis of aorta: Secondary | ICD-10-CM | POA: Diagnosis not present

## 2022-12-20 DIAGNOSIS — M25561 Pain in right knee: Secondary | ICD-10-CM | POA: Diagnosis not present

## 2022-12-20 DIAGNOSIS — I2510 Atherosclerotic heart disease of native coronary artery without angina pectoris: Secondary | ICD-10-CM | POA: Diagnosis not present

## 2022-12-20 DIAGNOSIS — E785 Hyperlipidemia, unspecified: Secondary | ICD-10-CM | POA: Diagnosis not present

## 2022-12-20 DIAGNOSIS — M25562 Pain in left knee: Secondary | ICD-10-CM | POA: Diagnosis not present

## 2022-12-20 DIAGNOSIS — E119 Type 2 diabetes mellitus without complications: Secondary | ICD-10-CM | POA: Diagnosis not present

## 2022-12-20 DIAGNOSIS — E871 Hypo-osmolality and hyponatremia: Secondary | ICD-10-CM | POA: Diagnosis not present

## 2023-03-08 ENCOUNTER — Other Ambulatory Visit: Payer: Self-pay | Admitting: Cardiology

## 2023-03-08 DIAGNOSIS — I10 Essential (primary) hypertension: Secondary | ICD-10-CM

## 2023-03-12 ENCOUNTER — Other Ambulatory Visit: Payer: Self-pay | Admitting: Cardiology

## 2023-03-12 DIAGNOSIS — I2510 Atherosclerotic heart disease of native coronary artery without angina pectoris: Secondary | ICD-10-CM

## 2023-04-01 ENCOUNTER — Encounter: Payer: Self-pay | Admitting: Cardiology

## 2023-04-01 ENCOUNTER — Ambulatory Visit: Payer: 59 | Admitting: Cardiology

## 2023-04-01 VITALS — BP 102/59 | HR 61 | Resp 16 | Ht 76.0 in | Wt 132.0 lb

## 2023-04-01 DIAGNOSIS — I2510 Atherosclerotic heart disease of native coronary artery without angina pectoris: Secondary | ICD-10-CM | POA: Diagnosis not present

## 2023-04-01 MED ORDER — LISINOPRIL 10 MG PO TABS: 10.0000 mg | ORAL_TABLET | Freq: Every evening | ORAL | 3 refills | Status: DC

## 2023-04-01 NOTE — Progress Notes (Signed)
Follow up visit  Subjective:   Michael Rivas, male    DOB: [birth year], 67 y.o.   MRN: [id number]   HPI   Chief Complaint  Patient presents with   Coronary Artery Disease   Follow-up    1 year   67 y.o. African American male with hypertension, hyperlipidemia, tobacco dependence, CAD,   Patient denies any chest pain.  He continues to have weight loss. Reportedly, colonoscopy was unremarkable in recent past.    Current Outpatient Medications:    amLODipine (NORVASC) 5 MG tablet, Take 1 tablet by mouth once daily, Disp: 90 tablet, Rfl: 0   aspirin EC 81 MG tablet, Take 1 tablet (81 mg total) by mouth daily. Swallow whole., Disp: 90 tablet, Rfl: 3   atorvastatin (LIPITOR) 80 MG tablet, TAKE 1 TABLET BY MOUTH ONCE DAILY AT 6PM, Disp: 90 tablet, Rfl: 0   Ensure (ENSURE), Take 237 mLs by mouth daily. , Disp: , Rfl:    lisinopril (ZESTRIL) 10 MG tablet, TAKE 1 TABLET BY MOUTH ONCE DAILY IN THE EVENING, Disp: 90 tablet, Rfl: 0   metFORMIN (GLUCOPHAGE-XR) 500 MG 24 hr tablet, Take 1 tablet by mouth 2 (two) times daily with a meal., Disp: , Rfl:    metoprolol tartrate (LOPRESSOR) 25 MG tablet, Take 1 tablet (25 mg total) by mouth 2 (two) times daily., Disp: 180 tablet, Rfl: 3   nitroGLYCERIN (NITROSTAT) 0.4 MG SL tablet, Place 1 tablet (0.4 mg total) under the tongue every 5 (five) minutes as needed for chest pain. Take for up to 3 doses. Call physician., Disp: 100 tablet, Rfl: 3   ofloxacin (FLOXIN) 0.3 % OTIC solution, as needed., Disp: , Rfl:     Cardiovascular & other pertient studies:  EKG 04/01/2023: Sinus rhythm 57 bpm Combined atrial enlargement Anteroseptal infarct -age undetermined  Echocardiogram 10/25/2019:  1. Normal LV systolic function; grade 1 diastolic dysfunction.   2. Left ventricular ejection fraction, by estimation, is 60 to 65%. The  left ventricle has normal function. The left ventricle has no regional  wall motion abnormalities. Left ventricular diastolic  parameters are  consistent with Grade I diastolic  dysfunction (impaired relaxation).   3. Right ventricular systolic function is normal. The right ventricular  size is normal.   4. The mitral valve is normal in structure. Trivial mitral valve  regurgitation. No evidence of mitral stenosis.   5. The aortic valve is tricuspid. Aortic valve regurgitation is not  visualized. Mild aortic valve sclerosis is present, with no evidence of  aortic valve stenosis.   6. The inferior vena cava is normal in size with greater than 50%  respiratory variability, suggesting right atrial pressure of 3 mmHg.   Coronary angiogram/intervention 10/25/2019: LM: Distal 30%. dFR 1.0 LAD: Distal 30% stenosis Lcx: Severe tortuosity with moderate calcification.          Pros 60%. Mid 80-90% stenosis at LCx/OM         bifurcation ([place] 1,1,0) Ostial OM 40% stenosis.  RCA: Long diffuse prox-mid 80% stenosis.          Distal RCA 40% stenosis          PDA diffuse 60% stenosis.    Complex procedure due to severity or tortuosity and calcification. Partially successful cutting balloon angioplasty prox-mid LCx Residual prox 40%, mid 40% stenosis at LCx/Om bifurcation, 60% calcific stenosis distal to bifurcation. This lesion remains inadequately prepared for stenting due to severe calcification.    Recommendation: Staging the procedure with consideration for  femoral access and atherectomy before stenting, followed by PCI to RCA. Continue DAPT with Aspirin and Brilinta.    Recent labs: 12/20/2022: Glucose 92, BUN/Cr 12/0.8. EGFR 96. K 4.8 Hb 13.4 HbA1C 6.4% Chol 111, TG 57, HDL 50, LDL 48  10/24/2020: Glucose 107, BUN/Cr 8/0.8. EGFR 92.  HbA1C 6.6% Chol 110, TG 50, HDL 49, LDL 49  02/18/2020: Glucose 103, BUN/Cr 9/0.94. EGFR >60. Na/K 139/4.4. Albumin 3.0. Rest of the CMP normal H/H 12/38. MCV 88. Platelets 272  10/26/2019: Glucose 107, BUN/Cr 5/0.88. EGFR >60. Na/K 130/4.3. Rest of the CMP normal H/H  12/35. MCV 83. Platelets 272 HbA1C 6.3% Chol 140, TG 50, HDL 49, LDL 81 TSH 2.7 normal    Review of Systems  Constitutional: Positive for weight loss.  HENT:  Negative for ear pain.   Cardiovascular:  Negative for chest pain, dyspnea on exertion, leg swelling, palpitations and syncope.         Vitals:   04/01/23 1353  BP: (!) 102/59  Pulse: 61  Resp: 16  SpO2: 97%      Body mass index is 16.07 kg/m. Filed Weights   04/01/23 1353  Weight: 132 lb (59.9 kg)      Objective:   Physical Exam Vitals and nursing note reviewed.  Constitutional:      Appearance: He is well-developed.  Neck:     Vascular: No JVD.  Cardiovascular:     Rate and Rhythm: Normal rate and regular rhythm.     Pulses: Intact distal pulses.          Dorsalis pedis pulses are 0 on the right side and 0 on the left side.       Posterior tibial pulses are 1+ on the right side and 1+ on the left side.     Heart sounds: Normal heart sounds. No murmur heard. Pulmonary:     Effort: Pulmonary effort is normal.     Breath sounds: Normal breath sounds. No wheezing or rales.  Musculoskeletal:     Right lower leg: No edema.     Left lower leg: No edema.           Assessment & Recommendations:   67 y.o. African American male with hypertension, hyperlipidemia, tobacco dependence, CAD,   CAD: Partially successful LCx intervention (10/2019) No chest pain symptoms at this time.  Continue Aspirin 81 mg daily. Continue statin, metoprolol.   Weight loss: Cause remains unclear. Continue follow up with PCP.  Hypertension: Controlled. Refilled lisinopril as requested.  F/u in 1 year  [name], MD [facility]. [place] Pager: [phone] Office: [phone]

## 2023-05-31 ENCOUNTER — Other Ambulatory Visit: Payer: Self-pay | Admitting: Cardiology

## 2023-05-31 DIAGNOSIS — I10 Essential (primary) hypertension: Secondary | ICD-10-CM

## 2023-06-22 DIAGNOSIS — E785 Hyperlipidemia, unspecified: Secondary | ICD-10-CM | POA: Diagnosis not present

## 2023-06-22 DIAGNOSIS — E119 Type 2 diabetes mellitus without complications: Secondary | ICD-10-CM | POA: Diagnosis not present

## 2023-06-22 DIAGNOSIS — E871 Hypo-osmolality and hyponatremia: Secondary | ICD-10-CM | POA: Diagnosis not present

## 2023-06-27 ENCOUNTER — Other Ambulatory Visit: Payer: Self-pay | Admitting: Cardiology

## 2023-06-27 DIAGNOSIS — I2510 Atherosclerotic heart disease of native coronary artery without angina pectoris: Secondary | ICD-10-CM

## 2023-06-28 ENCOUNTER — Other Ambulatory Visit (HOSPITAL_COMMUNITY): Payer: Self-pay | Admitting: Family Medicine

## 2023-06-28 DIAGNOSIS — R634 Abnormal weight loss: Secondary | ICD-10-CM | POA: Diagnosis not present

## 2023-06-28 DIAGNOSIS — I2510 Atherosclerotic heart disease of native coronary artery without angina pectoris: Secondary | ICD-10-CM | POA: Diagnosis not present

## 2023-06-28 DIAGNOSIS — I700 Atherosclerosis of aorta: Secondary | ICD-10-CM | POA: Diagnosis not present

## 2023-06-28 DIAGNOSIS — F17210 Nicotine dependence, cigarettes, uncomplicated: Secondary | ICD-10-CM | POA: Diagnosis not present

## 2023-06-28 DIAGNOSIS — E871 Hypo-osmolality and hyponatremia: Secondary | ICD-10-CM | POA: Diagnosis not present

## 2023-06-28 DIAGNOSIS — M25561 Pain in right knee: Secondary | ICD-10-CM | POA: Diagnosis not present

## 2023-06-28 DIAGNOSIS — Z87891 Personal history of nicotine dependence: Secondary | ICD-10-CM

## 2023-06-28 DIAGNOSIS — E785 Hyperlipidemia, unspecified: Secondary | ICD-10-CM | POA: Diagnosis not present

## 2023-06-28 DIAGNOSIS — M25562 Pain in left knee: Secondary | ICD-10-CM | POA: Diagnosis not present

## 2023-06-28 DIAGNOSIS — E119 Type 2 diabetes mellitus without complications: Secondary | ICD-10-CM | POA: Diagnosis not present

## 2023-07-21 ENCOUNTER — Ambulatory Visit (HOSPITAL_COMMUNITY)
Admission: RE | Admit: 2023-07-21 | Discharge: 2023-07-21 | Disposition: A | Discharge status: Home / Self Care | Payer: 59 | Source: Ambulatory Visit | Attending: Family Medicine | Admitting: Family Medicine

## 2023-07-21 DIAGNOSIS — Z87891 Personal history of nicotine dependence: Secondary | ICD-10-CM | POA: Insufficient documentation

## 2023-07-21 DIAGNOSIS — F17210 Nicotine dependence, cigarettes, uncomplicated: Secondary | ICD-10-CM | POA: Diagnosis not present

## 2023-08-04 ENCOUNTER — Other Ambulatory Visit: Payer: Self-pay | Admitting: Cardiology

## 2023-08-04 DIAGNOSIS — I2510 Atherosclerotic heart disease of native coronary artery without angina pectoris: Secondary | ICD-10-CM

## 2023-11-28 DIAGNOSIS — I10 Essential (primary) hypertension: Secondary | ICD-10-CM | POA: Diagnosis not present

## 2023-11-28 DIAGNOSIS — E785 Hyperlipidemia, unspecified: Secondary | ICD-10-CM | POA: Diagnosis not present

## 2023-11-28 DIAGNOSIS — R634 Abnormal weight loss: Secondary | ICD-10-CM | POA: Diagnosis not present

## 2023-11-28 DIAGNOSIS — I2510 Atherosclerotic heart disease of native coronary artery without angina pectoris: Secondary | ICD-10-CM | POA: Diagnosis not present

## 2023-11-28 DIAGNOSIS — F17200 Nicotine dependence, unspecified, uncomplicated: Secondary | ICD-10-CM | POA: Diagnosis not present

## 2023-11-28 DIAGNOSIS — E119 Type 2 diabetes mellitus without complications: Secondary | ICD-10-CM | POA: Diagnosis not present

## 2023-11-28 DIAGNOSIS — M25562 Pain in left knee: Secondary | ICD-10-CM | POA: Diagnosis not present

## 2023-11-28 DIAGNOSIS — E871 Hypo-osmolality and hyponatremia: Secondary | ICD-10-CM | POA: Diagnosis not present

## 2023-11-28 DIAGNOSIS — I700 Atherosclerosis of aorta: Secondary | ICD-10-CM | POA: Diagnosis not present

## 2023-11-28 DIAGNOSIS — M25561 Pain in right knee: Secondary | ICD-10-CM | POA: Diagnosis not present

## 2024-01-02 ENCOUNTER — Other Ambulatory Visit (HOSPITAL_COMMUNITY): Payer: Self-pay | Admitting: Radiology

## 2024-01-02 DIAGNOSIS — R7981 Abnormal blood-gas level: Secondary | ICD-10-CM

## 2024-01-18 DIAGNOSIS — R040 Epistaxis: Secondary | ICD-10-CM | POA: Diagnosis not present

## 2024-01-27 ENCOUNTER — Other Ambulatory Visit: Payer: Self-pay | Admitting: Cardiology

## 2024-01-27 DIAGNOSIS — I2510 Atherosclerotic heart disease of native coronary artery without angina pectoris: Secondary | ICD-10-CM

## 2024-03-15 ENCOUNTER — Other Ambulatory Visit: Payer: Self-pay | Admitting: Cardiology

## 2024-03-15 DIAGNOSIS — I2510 Atherosclerotic heart disease of native coronary artery without angina pectoris: Secondary | ICD-10-CM

## 2024-03-29 ENCOUNTER — Ambulatory Visit: Payer: 59 | Admitting: Cardiology

## 2024-03-29 DIAGNOSIS — E119 Type 2 diabetes mellitus without complications: Secondary | ICD-10-CM | POA: Diagnosis not present

## 2024-04-12 DIAGNOSIS — H2513 Age-related nuclear cataract, bilateral: Secondary | ICD-10-CM | POA: Diagnosis not present

## 2024-04-12 DIAGNOSIS — H02834 Dermatochalasis of left upper eyelid: Secondary | ICD-10-CM | POA: Diagnosis not present

## 2024-04-12 DIAGNOSIS — H02831 Dermatochalasis of right upper eyelid: Secondary | ICD-10-CM | POA: Diagnosis not present

## 2024-04-12 DIAGNOSIS — H02014 Cicatricial entropion of left upper eyelid: Secondary | ICD-10-CM | POA: Diagnosis not present

## 2024-04-12 DIAGNOSIS — H01001 Unspecified blepharitis right upper eyelid: Secondary | ICD-10-CM | POA: Diagnosis not present

## 2024-04-23 ENCOUNTER — Other Ambulatory Visit: Payer: Self-pay | Admitting: Cardiology

## 2024-04-23 DIAGNOSIS — I2510 Atherosclerotic heart disease of native coronary artery without angina pectoris: Secondary | ICD-10-CM

## 2024-04-25 ENCOUNTER — Other Ambulatory Visit: Payer: Self-pay | Admitting: Cardiology

## 2024-04-25 DIAGNOSIS — I2510 Atherosclerotic heart disease of native coronary artery without angina pectoris: Secondary | ICD-10-CM

## 2024-04-26 ENCOUNTER — Other Ambulatory Visit: Payer: Self-pay | Admitting: Cardiology

## 2024-04-26 DIAGNOSIS — I2510 Atherosclerotic heart disease of native coronary artery without angina pectoris: Secondary | ICD-10-CM

## 2024-05-04 ENCOUNTER — Other Ambulatory Visit: Payer: Self-pay | Admitting: Cardiology

## 2024-05-04 DIAGNOSIS — I2510 Atherosclerotic heart disease of native coronary artery without angina pectoris: Secondary | ICD-10-CM

## 2024-05-06 ENCOUNTER — Other Ambulatory Visit: Payer: Self-pay | Admitting: Cardiology

## 2024-05-06 DIAGNOSIS — I2510 Atherosclerotic heart disease of native coronary artery without angina pectoris: Secondary | ICD-10-CM

## 2024-05-08 ENCOUNTER — Other Ambulatory Visit: Payer: Self-pay | Admitting: Cardiology

## 2024-05-08 DIAGNOSIS — I2510 Atherosclerotic heart disease of native coronary artery without angina pectoris: Secondary | ICD-10-CM

## 2024-05-10 ENCOUNTER — Other Ambulatory Visit: Payer: Self-pay | Admitting: Cardiology

## 2024-05-10 DIAGNOSIS — I2510 Atherosclerotic heart disease of native coronary artery without angina pectoris: Secondary | ICD-10-CM

## 2024-05-11 DIAGNOSIS — E119 Type 2 diabetes mellitus without complications: Secondary | ICD-10-CM | POA: Diagnosis not present

## 2024-05-11 DIAGNOSIS — E785 Hyperlipidemia, unspecified: Secondary | ICD-10-CM | POA: Diagnosis not present

## 2024-05-17 DIAGNOSIS — M25561 Pain in right knee: Secondary | ICD-10-CM | POA: Diagnosis not present

## 2024-05-17 DIAGNOSIS — Z0001 Encounter for general adult medical examination with abnormal findings: Secondary | ICD-10-CM | POA: Diagnosis not present

## 2024-05-17 DIAGNOSIS — I700 Atherosclerosis of aorta: Secondary | ICD-10-CM | POA: Diagnosis not present

## 2024-05-17 DIAGNOSIS — E1165 Type 2 diabetes mellitus with hyperglycemia: Secondary | ICD-10-CM | POA: Diagnosis not present

## 2024-05-17 DIAGNOSIS — Z23 Encounter for immunization: Secondary | ICD-10-CM | POA: Diagnosis not present

## 2024-05-17 DIAGNOSIS — M25562 Pain in left knee: Secondary | ICD-10-CM | POA: Diagnosis not present

## 2024-05-17 DIAGNOSIS — Z0000 Encounter for general adult medical examination without abnormal findings: Secondary | ICD-10-CM | POA: Diagnosis not present

## 2024-05-17 DIAGNOSIS — I10 Essential (primary) hypertension: Secondary | ICD-10-CM | POA: Diagnosis not present

## 2024-05-17 DIAGNOSIS — E785 Hyperlipidemia, unspecified: Secondary | ICD-10-CM | POA: Diagnosis not present

## 2024-05-17 DIAGNOSIS — R809 Proteinuria, unspecified: Secondary | ICD-10-CM | POA: Diagnosis not present

## 2024-05-17 DIAGNOSIS — I2510 Atherosclerotic heart disease of native coronary artery without angina pectoris: Secondary | ICD-10-CM | POA: Diagnosis not present

## 2024-05-17 DIAGNOSIS — E871 Hypo-osmolality and hyponatremia: Secondary | ICD-10-CM | POA: Diagnosis not present

## 2024-05-21 DIAGNOSIS — H2512 Age-related nuclear cataract, left eye: Secondary | ICD-10-CM | POA: Diagnosis not present

## 2024-05-23 ENCOUNTER — Encounter (HOSPITAL_COMMUNITY)
Admission: RE | Admit: 2024-05-23 | Discharge: 2024-05-23 | Disposition: A | Discharge status: Home / Self Care | Source: Ambulatory Visit | Attending: Ophthalmology | Admitting: Ophthalmology

## 2024-05-23 NOTE — H&P (Signed)
 Surgical History & Physical  Patient Name: Michael Rivas  DOB: 1955-08-12  Surgery: Cataract extraction with intraocular lens implant phacoemulsification; Left Eye Surgeon: Lynwood Hermann MD Surgery Date: 05/28/2024 Pre-Op Date: 04/12/2024  HPI: A 78 Yr. old male patient 1. The patient complains of difficulty when recognizing people, which began 6 months ago. Both eyes are affected. The episode is constant. The patient describes foggy and hazy symptoms affecting their eyes/vision. This is worsening. This is negatively affecting the patient's quality of life and the patient is unable to function adequately in life with the current level of vision. HPI Completed by Dr. Lynwood Hermann  Medical History: Cataracts  Diabetes Heart Problem High Blood Pressure  Review of Systems Cardiovascular High Blood Pressure, heart attack Endocrine diabetes All recorded systems are negative except as noted above.  Social Never Smoked  Medication Prednisolone acetate, Ilevro, Moxifloxacin,  Atorvastatin , Amlodipine , Metoprolol  tartrate, Lisinopril , Metformin (Glucophage XR)  Sx/Procedures Eyelid - Epilation of Cilia,  Open artery after heart attack  Drug Allergies  NKDA  History & Physical: Heent: cataracts NECK: supple without bruits LUNGS: lungs clear to auscultation CV: regular rate and rhythm Abdomen: soft and non-tender  Impression & Plan: Assessment: 1.  NUCLEAR SCLEROSIS AGE RELATED; Both Eyes (H25.13) 2.  DERMATOCHALASIS, no surgery; Right Upper Lid, Left Upper Lid (H02.831, H02.834) 3.  BLEPHARITIS; Right Upper Lid, Right Lower Lid, Left Upper Lid, Left Lower Lid (H01.001, H01.002,H01.004,H01.005) 4.  Pinguecula; Both Eyes (H11.153) 5.  ARCUS SENILIS; Both Eyes (H18.413) 6.  ASTIGMATISM, REGULAR; Both Eyes (H52.223) 7.  Trichiasis; Left Upper Lid (H02.014)  Plan: 1.  Cataract accounts for the patient's decreased vision. This visual impairment is not correctable with a  tolerable change in glasses or contact lenses. Cataract surgery with an implantation of a new lens should significantly improve the visual and functional status of the patient. Recommend phacoemulsification with intraocular lens. Discussed all risks, benefits, alternatives, and potential complications. Discussed the procedures and recovery. The patient desires to have surgery. A-scan/Biometry ordered and will be performed for intraocular lens calculations. The surgery will be performed in order to improve vision for driving, reading, and for eye examinations. Recommend Dextenza  for post-operative pain and inflammation. Educational materials provided: Cataract. History of corneal refractive Surgery: None History of Previous Ocular Surgery (PPV, other): None History of ocular trauma: None Use of Eye Pressure Lowering Drops: None Pupil Status: Dilates poorly - shugarcaine or Lidocaine +Omidira by protocol, Malyugin Ring Left Eye worse. OS first, then OD. Malyugin Ring Recommend Toric Lens OU.  2.  Asymptomatic, recommend observation for now. Findings, prognosis and treatment options reviewed.  3.  Blepharitis is present - recommend regular lid cleaning.  4.  Observe; Artificial tears as needed for irritation.  5.  Discussed significance of finding  6.  Recommend Toric IOL OU  7.  Mild entropion - secondary to Bells Palsy. Lash removed at slit lamp today.

## 2024-05-24 ENCOUNTER — Other Ambulatory Visit: Payer: Self-pay | Admitting: Cardiology

## 2024-05-24 ENCOUNTER — Encounter (HOSPITAL_COMMUNITY): Payer: Self-pay

## 2024-05-24 ENCOUNTER — Other Ambulatory Visit: Payer: Self-pay

## 2024-05-24 DIAGNOSIS — I2510 Atherosclerotic heart disease of native coronary artery without angina pectoris: Secondary | ICD-10-CM

## 2024-05-28 ENCOUNTER — Ambulatory Visit (HOSPITAL_COMMUNITY): Admitting: Certified Registered"

## 2024-05-28 ENCOUNTER — Encounter (HOSPITAL_COMMUNITY)
Admission: RE | Disposition: A | Discharge status: Home / Self Care | Payer: Self-pay | Source: Home / Self Care | Attending: Ophthalmology

## 2024-05-28 ENCOUNTER — Ambulatory Visit (HOSPITAL_COMMUNITY)
Admission: RE | Admit: 2024-05-28 | Discharge: 2024-05-28 | Disposition: A | Discharge status: Home / Self Care | Attending: Ophthalmology | Admitting: Ophthalmology

## 2024-05-28 ENCOUNTER — Encounter (HOSPITAL_COMMUNITY): Payer: Self-pay | Admitting: Ophthalmology

## 2024-05-28 ENCOUNTER — Other Ambulatory Visit: Payer: Self-pay

## 2024-05-28 DIAGNOSIS — E1136 Type 2 diabetes mellitus with diabetic cataract: Secondary | ICD-10-CM | POA: Insufficient documentation

## 2024-05-28 DIAGNOSIS — F17210 Nicotine dependence, cigarettes, uncomplicated: Secondary | ICD-10-CM | POA: Diagnosis not present

## 2024-05-28 DIAGNOSIS — I25119 Atherosclerotic heart disease of native coronary artery with unspecified angina pectoris: Secondary | ICD-10-CM | POA: Insufficient documentation

## 2024-05-28 DIAGNOSIS — I10 Essential (primary) hypertension: Secondary | ICD-10-CM | POA: Insufficient documentation

## 2024-05-28 DIAGNOSIS — H02834 Dermatochalasis of left upper eyelid: Secondary | ICD-10-CM | POA: Diagnosis not present

## 2024-05-28 DIAGNOSIS — H02831 Dermatochalasis of right upper eyelid: Secondary | ICD-10-CM | POA: Diagnosis not present

## 2024-05-28 DIAGNOSIS — F17200 Nicotine dependence, unspecified, uncomplicated: Secondary | ICD-10-CM | POA: Diagnosis not present

## 2024-05-28 DIAGNOSIS — I2510 Atherosclerotic heart disease of native coronary artery without angina pectoris: Secondary | ICD-10-CM

## 2024-05-28 DIAGNOSIS — Z7984 Long term (current) use of oral hypoglycemic drugs: Secondary | ICD-10-CM | POA: Insufficient documentation

## 2024-05-28 DIAGNOSIS — H11153 Pinguecula, bilateral: Secondary | ICD-10-CM | POA: Diagnosis not present

## 2024-05-28 DIAGNOSIS — H0100A Unspecified blepharitis right eye, upper and lower eyelids: Secondary | ICD-10-CM | POA: Insufficient documentation

## 2024-05-28 DIAGNOSIS — H52223 Regular astigmatism, bilateral: Secondary | ICD-10-CM | POA: Insufficient documentation

## 2024-05-28 DIAGNOSIS — H0100B Unspecified blepharitis left eye, upper and lower eyelids: Secondary | ICD-10-CM | POA: Insufficient documentation

## 2024-05-28 DIAGNOSIS — I252 Old myocardial infarction: Secondary | ICD-10-CM | POA: Insufficient documentation

## 2024-05-28 DIAGNOSIS — H02054 Trichiasis without entropian left upper eyelid: Secondary | ICD-10-CM | POA: Diagnosis not present

## 2024-05-28 DIAGNOSIS — H18413 Arcus senilis, bilateral: Secondary | ICD-10-CM | POA: Diagnosis not present

## 2024-05-28 DIAGNOSIS — H2512 Age-related nuclear cataract, left eye: Secondary | ICD-10-CM | POA: Diagnosis not present

## 2024-05-28 DIAGNOSIS — E1169 Type 2 diabetes mellitus with other specified complication: Secondary | ICD-10-CM

## 2024-05-28 DIAGNOSIS — H2513 Age-related nuclear cataract, bilateral: Secondary | ICD-10-CM | POA: Insufficient documentation

## 2024-05-28 HISTORY — PX: CATARACT EXTRACTION W/PHACO: SHX586

## 2024-05-28 LAB — GLUCOSE, CAPILLARY: Glucose-Capillary: 93 mg/dL (ref 70–99)

## 2024-05-28 SURGERY — PHACOEMULSIFICATION, CATARACT, WITH IOL INSERTION
Anesthesia: Monitor Anesthesia Care | Site: Eye | Laterality: Left

## 2024-05-28 MED ORDER — STERILE WATER FOR IRRIGATION IR SOLN
Status: DC | PRN
  Administered 2024-05-28: 1

## 2024-05-28 MED ORDER — BSS IO SOLN
INTRAOCULAR | Status: DC | PRN
  Administered 2024-05-28: 15 mL via INTRAOCULAR

## 2024-05-28 MED ORDER — LIDOCAINE HCL (PF) 1 % IJ SOLN
INTRAMUSCULAR | Status: DC | PRN
  Administered 2024-05-28: 1 mL

## 2024-05-28 MED ORDER — MIDAZOLAM HCL 2 MG/2ML IJ SOLN
INTRAMUSCULAR | Status: AC
  Filled 2024-05-28: qty 2

## 2024-05-28 MED ORDER — PHENYLEPHRINE HCL 2.5 % OP SOLN
1.0000 [drp] | OPHTHALMIC | Status: AC | PRN
  Administered 2024-05-28 (×3): 1 [drp] via OPHTHALMIC

## 2024-05-28 MED ORDER — LACTATED RINGERS IV SOLN: INTRAVENOUS | Status: DC

## 2024-05-28 MED ORDER — POVIDONE-IODINE 5 % OP SOLN
OPHTHALMIC | Status: DC | PRN
Start: 2024-05-28 — End: 2024-05-28
  Administered 2024-05-28: 1 via OPHTHALMIC

## 2024-05-28 MED ORDER — MOXIFLOXACIN HCL 5 MG/ML IO SOLN
INTRAOCULAR | Status: DC | PRN
  Administered 2024-05-28: .3 mL via INTRACAMERAL

## 2024-05-28 MED ORDER — SODIUM HYALURONATE 10 MG/ML IO SOLUTION
PREFILLED_SYRINGE | INTRAOCULAR | Status: DC | PRN
Start: 2024-05-28 — End: 2024-05-28
  Administered 2024-05-28: .85 mL via INTRAOCULAR

## 2024-05-28 MED ORDER — LIDOCAINE HCL 3.5 % OP GEL
1.0000 | Freq: Once | OPHTHALMIC | Status: AC
  Administered 2024-05-28: 1 via OPHTHALMIC

## 2024-05-28 MED ORDER — SODIUM HYALURONATE 23MG/ML IO SOSY
PREFILLED_SYRINGE | INTRAOCULAR | Status: DC | PRN
  Administered 2024-05-28: .6 mL via INTRAOCULAR

## 2024-05-28 MED ORDER — PHENYLEPHRINE-KETOROLAC 1-0.3 % IO SOLN
INTRAOCULAR | Status: DC | PRN
  Administered 2024-05-28: 500 mL via OPHTHALMIC

## 2024-05-28 MED ORDER — TROPICAMIDE 1 % OP SOLN
1.0000 [drp] | OPHTHALMIC | Status: AC | PRN
  Administered 2024-05-28 (×3): 1 [drp] via OPHTHALMIC

## 2024-05-28 MED ORDER — TETRACAINE HCL 0.5 % OP SOLN
1.0000 [drp] | OPHTHALMIC | Status: AC | PRN
  Administered 2024-05-28 (×3): 1 [drp] via OPHTHALMIC

## 2024-05-28 SURGICAL SUPPLY — 11 items
CLOTH BEACON ORANGE TIMEOUT ST (SAFETY) ×1 IMPLANT
EYE SHIELD UNIVERSAL CLEAR (GAUZE/BANDAGES/DRESSINGS) IMPLANT
FEE CATARACT SUITE SIGHTPATH (MISCELLANEOUS) ×1 IMPLANT
GLOVE BIOGEL PI IND STRL 7.0 (GLOVE) ×2 IMPLANT
LENS IOL TECNIS EYHANCE 15.0 (Intraocular Lens) IMPLANT
NDL HYPO 18GX1.5 BLUNT FILL (NEEDLE) ×1 IMPLANT
NEEDLE HYPO 18GX1.5 BLUNT FILL (NEEDLE) ×1 IMPLANT
PAD ARMBOARD POSITIONER FOAM (MISCELLANEOUS) ×1 IMPLANT
SYR TB 1ML LL NO SAFETY (SYRINGE) ×1 IMPLANT
TAPE SURG TRANSPORE 1 IN (GAUZE/BANDAGES/DRESSINGS) IMPLANT
WATER STERILE IRR 250ML POUR (IV SOLUTION) ×1 IMPLANT

## 2024-05-28 NOTE — Transfer of Care (Signed)
 Immediate Anesthesia Transfer of Care Note  Patient: Michael Rivas  Procedure(s) Performed: PHACOEMULSIFICATION, CATARACT, WITH IOL INSERTION (Left: Eye)  Patient Location: Short Stay  Anesthesia Type:MAC  Level of Consciousness: awake, alert , oriented, and patient cooperative  Airway & Oxygen Therapy: Patient Spontanous Breathing  Post-op Assessment: Report given to RN and Post -op Vital signs reviewed and stable  Post vital signs: Reviewed and stable  Last Vitals:  Vitals Value Taken Time  BP 124/74   Temp 98.6   Pulse 68   Resp 16   SpO2 98     Last Pain:  Vitals:   05/28/24 0947  TempSrc: Oral      Patients Stated Pain Goal: 5 (05/28/24 0947)  Complications: No notable events documented.

## 2024-05-28 NOTE — Anesthesia Postprocedure Evaluation (Signed)
 Anesthesia Post Note  Patient: Michael Rivas  Procedure(s) Performed: PHACOEMULSIFICATION, CATARACT, WITH IOL INSERTION (Left: Eye)  Patient location during evaluation: PACU Anesthesia Type: MAC Level of consciousness: awake and alert Pain management: pain level controlled Vital Signs Assessment: post-procedure vital signs reviewed and stable Respiratory status: spontaneous breathing, nonlabored ventilation, respiratory function stable and patient connected to nasal cannula oxygen Cardiovascular status: stable and blood pressure returned to baseline Postop Assessment: no apparent nausea or vomiting Anesthetic complications: no   No notable events documented.   Last Vitals:  Vitals:   05/28/24 0947 05/28/24 1039  BP: 117/84 122/69  Pulse: (!) 52 (!) 52  Resp: 13 14  Temp: 36.5 C 36.5 C  SpO2: 100% 100%    Last Pain:  Vitals:   05/28/24 1039  TempSrc: Oral  PainSc: 0-No pain                 Andrea Limes

## 2024-05-28 NOTE — Anesthesia Preprocedure Evaluation (Addendum)
 Anesthesia Evaluation  Patient identified by MRN, date of birth, ID band Patient awake    Reviewed: Allergy & Precautions, H&P , NPO status , Patient's Chart, lab work & pertinent test results  Airway Mallampati: II  TM Distance: >3 FB Neck ROM: Full    Dental no notable dental hx.    Pulmonary Current Smoker and Patient abstained from smoking.   Pulmonary exam normal breath sounds clear to auscultation       Cardiovascular hypertension, + angina  + CAD and + Past MI  Normal cardiovascular exam Rhythm:Regular Rate:Normal     Neuro/Psych  Headaches PSYCHIATRIC DISORDERS  Depression     Neuromuscular disease    GI/Hepatic negative GI ROS, Neg liver ROS,,,  Endo/Other  diabetes    Renal/GU negative Renal ROS  negative genitourinary   Musculoskeletal negative musculoskeletal ROS (+)    Abdominal   Peds negative pediatric ROS (+)  Hematology negative hematology ROS (+)   Anesthesia Other Findings   Reproductive/Obstetrics negative OB ROS                              Anesthesia Physical Anesthesia Plan  ASA: 3  Anesthesia Plan: MAC   Post-op Pain Management:    Induction:   PONV Risk Score and Plan:   Airway Management Planned: Nasal Cannula  Additional Equipment:   Intra-op Plan:   Post-operative Plan:   Informed Consent: I have reviewed the patients History and Physical, chart, labs and discussed the procedure including the risks, benefits and alternatives for the proposed anesthesia with the patient or authorized representative who has indicated his/her understanding and acceptance.     Dental advisory given  Plan Discussed with: CRNA  Anesthesia Plan Comments:          Anesthesia Quick Evaluation

## 2024-05-28 NOTE — Op Note (Signed)
 Date of procedure: 05/28/24  Pre-operative diagnosis: Visually significant age-related nuclear cataract, Left Eye (H25.12)  Post-operative diagnosis: Visually significant age-related nuclear cataract, Left Eye  Procedure: Removal of cataract via phacoemulsification and insertion of intra-ocular lens Vicci and Johnson DIB00 +15.0D into the capsular bag of the Left Eye  Attending surgeon: Lynwood LABOR. Thomasine Klutts, MD, MA  Anesthesia: MAC, Topical Akten  Complications: None  Estimated Blood Loss: <33mL (minimal)  Specimens: None  Implants: As above  Indications:  Visually significant age-related cataract, Left Eye  Procedure:  The patient was seen and identified in the pre-operative area. The operative eye was identified and dilated.  The operative eye was marked.  Topical anesthesia was administered to the operative eye.     The patient was then to the operative suite and placed in the supine position.  A timeout was performed confirming the patient, procedure to be performed, and all other relevant information.   The patient's face was prepped and draped in the usual fashion for intra-ocular surgery.  A lid speculum was placed into the operative eye and the surgical microscope moved into place and focused.  An inferotemporal paracentesis was created using a 20 gauge paracentesis blade. Omidria was injected into the anterior chamber. Shugarcaine was injected into the anterior chamber.  Viscoelastic was injected into the anterior chamber.  A temporal clear-corneal main wound incision was created using a 2.13mm microkeratome.  A continuous curvilinear capsulorrhexis was initiated using an irrigating cystitome and completed using capsulorrhexis forceps.  Hydrodissection and hydrodeliniation were performed.  Viscoelastic was injected into the anterior chamber.  A phacoemulsification handpiece and a chopper as a second instrument were used to remove the nucleus and epinucleus. The irrigation/aspiration  handpiece was used to remove any remaining cortical material.   The capsular bag was reinflated with viscoelastic, checked, and found to be intact.  The intraocular lens was inserted into the capsular bag.  The irrigation/aspiration handpiece was used to remove any remaining viscoelastic.  The clear corneal wound and paracentesis wounds were then hydrated and checked with Weck-Cels to be watertight. 0.1mL of moxifloxacin was injected into the anterior chamber. The lid-speculum and drape was removed, and the patient's face was cleaned with a wet and dry 4x4. A clear shield was taped over the eye. The patient was taken to the post-operative care unit in good condition, having tolerated the procedure well.  Post-Op Instructions: The patient will follow up at [facility] for a same day post-operative evaluation and will receive all other orders and instructions.

## 2024-05-28 NOTE — Discharge Instructions (Signed)
 Please discharge patient when stable, will follow up today with Dr. June Leap at the [facility] office immediately following discharge.  Leave shield in place until visit.  All paperwork with discharge instructions will be given at the office.  [facility] Address:  [address]  [place], [place] [address]

## 2024-05-28 NOTE — Interval H&P Note (Signed)
 History and Physical Interval Note:  05/28/2024 10:14 AM  Michael Rivas  has presented today for surgery, with the diagnosis of nuclear sclerotic cataract, left eye.  The various methods of treatment have been discussed with the patient and family. After consideration of risks, benefits and other options for treatment, the patient has consented to  Procedure(s): PHACOEMULSIFICATION, CATARACT, WITH IOL INSERTION (Left) as a surgical intervention.  The patient's history has been reviewed, patient examined, no change in status, stable for surgery.  I have reviewed the patient's chart and labs.  Questions were answered to the patient's satisfaction.     HARRIE AGENT

## 2024-06-04 ENCOUNTER — Other Ambulatory Visit: Payer: Self-pay | Admitting: Cardiology

## 2024-06-04 DIAGNOSIS — I2510 Atherosclerotic heart disease of native coronary artery without angina pectoris: Secondary | ICD-10-CM

## 2024-06-06 ENCOUNTER — Encounter (HOSPITAL_COMMUNITY): Payer: Self-pay

## 2024-06-06 ENCOUNTER — Encounter (HOSPITAL_COMMUNITY)
Admission: RE | Admit: 2024-06-06 | Discharge: 2024-06-06 | Disposition: A | Discharge status: Home / Self Care | Source: Ambulatory Visit | Attending: Ophthalmology | Admitting: Ophthalmology

## 2024-06-08 NOTE — H&P (Signed)
 Surgical History & Physical  Patient Name: Michael Rivas  DOB: 12-10-1955  Surgery: Cataract extraction with intraocular lens implant phacoemulsification; Right Eye Surgeon: Lynwood Hermann MD Surgery Date: 06/11/2024 Pre-Op Date: 06/04/2024  HPI: A 10 Yr. old male patient returning for 1 week post op OS/Pre OP OD. OS: Pt states: It's doing alright. No pain or anything it's a little itchy sometimes I wanna scratch it. Pt admits compliance with eye drops. OD: Pt still c/o of blurred and decreased vision in OD. He say: it's not as good as the left. Patient having trouble reading and seeing people's faces. This is negatively affecting the patient's quality of life and the patient is unable to function adequately in life with the current level of vision. HPI was performed by Lynwood Hermann .  Medical History: Cataracts  Diabetes Heart Problem High Blood Pressure  Review of Systems Cardiovascular High Blood Pressure, heart attack Endocrine diabetes All recorded systems are negative except as noted above.  Social Never Smoked  Medication Prednisolone acetate, Ilevro, Moxifloxacin,  Atorvastatin , Amlodipine , Metoprolol  tartrate, Lisinopril , Metformin (Glucophage XR)  Sx/Procedures Eyelid - Epilation of Cilia, Phaco c IOL OS,  Open artery after heart attack  Drug Allergies  NKDA  History & Physical: Heent: cataract NECK: supple without bruits LUNGS: lungs clear to auscultation CV: regular rate and rhythm Abdomen: soft and non-tender  Impression & Plan: Assessment: 1.  CATARACT EXTRACTION STATUS; Left Eye (Z98.42) 2.  NUCLEAR SCLEROSIS AGE RELATED; , Right Eye (H25.11)  Plan: 1.  1 week after cataract surgery. Doing well with improved vision and normal eye pressure. Call with any problems or concerns. Stop Vigamox. Continue Ilevro 1 drop 1x/day for 3 more weeks. Continue Pred Acetate 1 drop 2x/day for 3 more weeks.  2.  Cataract accounts for the patient's decreased vision.  This visual impairment is not correctable with a tolerable change in glasses or contact lenses. Cataract surgery with an implantation of a new lens should significantly improve the visual and functional status of the patient. Recommend phacoemulsification with intraocular lens. Discussed all risks, benefits, alternatives, and potential complications. Discussed the procedures and recovery. The patient desires to have surgery. A-scan/Biometry ordered and will be performed for intraocular lens calculations. The surgery will be performed in order to improve vision for driving, reading, and for eye examinations. Recommend Dextenza  for post-operative pain and inflammation. Educational materials provided: Cataract. History of corneal refractive Surgery: None History of Previous Ocular Surgery (PPV, other): None History of ocular trauma: None Use of Eye Pressure Lowering Drops: None Pupil Status: Dilates poorly - shugarcaine or Lidocaine +Omidira by protocol, Malyugin Ring Right Eye. Malyugin Ring

## 2024-06-11 ENCOUNTER — Ambulatory Visit (HOSPITAL_COMMUNITY)
Admission: RE | Admit: 2024-06-11 | Discharge: 2024-06-11 | Disposition: A | Discharge status: Home / Self Care | Attending: Ophthalmology | Admitting: Ophthalmology

## 2024-06-11 ENCOUNTER — Ambulatory Visit (HOSPITAL_COMMUNITY): Admitting: Anesthesiology

## 2024-06-11 ENCOUNTER — Other Ambulatory Visit: Payer: Self-pay

## 2024-06-11 ENCOUNTER — Encounter (HOSPITAL_COMMUNITY): Payer: Self-pay | Admitting: Ophthalmology

## 2024-06-11 ENCOUNTER — Encounter (HOSPITAL_COMMUNITY)
Admission: RE | Disposition: A | Discharge status: Home / Self Care | Payer: Self-pay | Source: Home / Self Care | Attending: Ophthalmology

## 2024-06-11 DIAGNOSIS — F17210 Nicotine dependence, cigarettes, uncomplicated: Secondary | ICD-10-CM | POA: Diagnosis not present

## 2024-06-11 DIAGNOSIS — H2511 Age-related nuclear cataract, right eye: Secondary | ICD-10-CM | POA: Insufficient documentation

## 2024-06-11 DIAGNOSIS — E1136 Type 2 diabetes mellitus with diabetic cataract: Secondary | ICD-10-CM | POA: Insufficient documentation

## 2024-06-11 DIAGNOSIS — I10 Essential (primary) hypertension: Secondary | ICD-10-CM | POA: Insufficient documentation

## 2024-06-11 DIAGNOSIS — F17200 Nicotine dependence, unspecified, uncomplicated: Secondary | ICD-10-CM | POA: Diagnosis not present

## 2024-06-11 DIAGNOSIS — I2510 Atherosclerotic heart disease of native coronary artery without angina pectoris: Secondary | ICD-10-CM | POA: Insufficient documentation

## 2024-06-11 DIAGNOSIS — I25119 Atherosclerotic heart disease of native coronary artery with unspecified angina pectoris: Secondary | ICD-10-CM | POA: Diagnosis not present

## 2024-06-11 DIAGNOSIS — Z9842 Cataract extraction status, left eye: Secondary | ICD-10-CM | POA: Diagnosis not present

## 2024-06-11 DIAGNOSIS — I252 Old myocardial infarction: Secondary | ICD-10-CM | POA: Insufficient documentation

## 2024-06-11 HISTORY — PX: CATARACT EXTRACTION W/PHACO: SHX586

## 2024-06-11 LAB — GLUCOSE, CAPILLARY: Glucose-Capillary: 83 mg/dL (ref 70–99)

## 2024-06-11 SURGERY — PHACOEMULSIFICATION, CATARACT, WITH IOL INSERTION
Anesthesia: Monitor Anesthesia Care | Site: Eye | Laterality: Right

## 2024-06-11 MED ORDER — LACTATED RINGERS IV SOLN: INTRAVENOUS | Status: DC

## 2024-06-11 MED ORDER — TROPICAMIDE 1 % OP SOLN
1.0000 [drp] | OPHTHALMIC | Status: AC | PRN
  Administered 2024-06-11 (×3): 1 [drp] via OPHTHALMIC

## 2024-06-11 MED ORDER — LIDOCAINE HCL (PF) 1 % IJ SOLN
INTRAMUSCULAR | Status: DC | PRN
Start: 2024-06-11 — End: 2024-06-11
  Administered 2024-06-11: 2 mL

## 2024-06-11 MED ORDER — SODIUM HYALURONATE 10 MG/ML IO SOLUTION
PREFILLED_SYRINGE | INTRAOCULAR | Status: DC | PRN
Start: 2024-06-11 — End: 2024-06-11
  Administered 2024-06-11: .85 mL via INTRAOCULAR

## 2024-06-11 MED ORDER — PHENYLEPHRINE-KETOROLAC 1-0.3 % IO SOLN
INTRAOCULAR | Status: DC | PRN
  Administered 2024-06-11: 500 mL via OPHTHALMIC

## 2024-06-11 MED ORDER — POVIDONE-IODINE 5 % OP SOLN
OPHTHALMIC | Status: DC | PRN
  Administered 2024-06-11: 1 via OPHTHALMIC

## 2024-06-11 MED ORDER — STERILE WATER FOR IRRIGATION IR SOLN
Status: DC | PRN
Start: 2024-06-11 — End: 2024-06-11
  Administered 2024-06-11: 250 mL

## 2024-06-11 MED ORDER — LIDOCAINE HCL 3.5 % OP GEL
1.0000 | Freq: Once | OPHTHALMIC | Status: AC
  Administered 2024-06-11: 1 via OPHTHALMIC

## 2024-06-11 MED ORDER — SODIUM HYALURONATE 23MG/ML IO SOSY
PREFILLED_SYRINGE | INTRAOCULAR | Status: DC | PRN
Start: 2024-06-11 — End: 2024-06-11
  Administered 2024-06-11: .6 mL via INTRAOCULAR

## 2024-06-11 MED ORDER — PHENYLEPHRINE HCL 2.5 % OP SOLN
1.0000 [drp] | OPHTHALMIC | Status: AC | PRN
  Administered 2024-06-11 (×3): 1 [drp] via OPHTHALMIC

## 2024-06-11 MED ORDER — BSS IO SOLN
INTRAOCULAR | Status: DC | PRN
  Administered 2024-06-11: 15 mL via INTRAOCULAR

## 2024-06-11 MED ORDER — MOXIFLOXACIN HCL 5 MG/ML IO SOLN
INTRAOCULAR | Status: DC | PRN
Start: 2024-06-11 — End: 2024-06-11
  Administered 2024-06-11: .2 mL via OPHTHALMIC

## 2024-06-11 MED ORDER — TETRACAINE HCL 0.5 % OP SOLN
1.0000 [drp] | OPHTHALMIC | Status: AC | PRN
  Administered 2024-06-11 (×3): 1 [drp] via OPHTHALMIC

## 2024-06-11 SURGICAL SUPPLY — 11 items
CLOTH BEACON ORANGE TIMEOUT ST (SAFETY) ×1 IMPLANT
EYE SHIELD UNIVERSAL CLEAR (GAUZE/BANDAGES/DRESSINGS) IMPLANT
FEE CATARACT SUITE SIGHTPATH (MISCELLANEOUS) ×1 IMPLANT
GLOVE BIOGEL PI IND STRL 7.0 (GLOVE) ×2 IMPLANT
LENS IOL TECNIS EYHANCE 13.0 (Intraocular Lens) IMPLANT
NDL HYPO 18GX1.5 BLUNT FILL (NEEDLE) ×1 IMPLANT
NEEDLE HYPO 18GX1.5 BLUNT FILL (NEEDLE) ×1 IMPLANT
PAD ARMBOARD POSITIONER FOAM (MISCELLANEOUS) ×1 IMPLANT
SYR TB 1ML LL NO SAFETY (SYRINGE) ×1 IMPLANT
TAPE SURG TRANSPORE 1 IN (GAUZE/BANDAGES/DRESSINGS) IMPLANT
WATER STERILE IRR 250ML POUR (IV SOLUTION) ×1 IMPLANT

## 2024-06-11 NOTE — Interval H&P Note (Signed)
 History and Physical Interval Note:  06/11/2024 7:55 AM  Michael Rivas  has presented today for surgery, with the diagnosis of nuclear sclerotic cataract, right eye.  The various methods of treatment have been discussed with the patient and family. After consideration of risks, benefits and other options for treatment, the patient has consented to  Procedure(s): PHACOEMULSIFICATION, CATARACT, WITH IOL INSERTION (Right) as a surgical intervention.  The patient's history has been reviewed, patient examined, no change in status, stable for surgery.  I have reviewed the patient's chart and labs.  Questions were answered to the patient's satisfaction.     HARRIE AGENT

## 2024-06-11 NOTE — Discharge Instructions (Signed)
 Please discharge patient when stable, will follow up today with Dr. June Leap at the [facility] office immediately following discharge.  Leave shield in place until visit.  All paperwork with discharge instructions will be given at the office.  [facility] Address:  [address]  [place], [place] [address]

## 2024-06-11 NOTE — Anesthesia Preprocedure Evaluation (Signed)
 Anesthesia Evaluation  Patient identified by MRN, date of birth, ID band Patient awake    Reviewed: Allergy & Precautions, H&P , NPO status , Patient's Chart, lab work & pertinent test results  Airway Mallampati: II  TM Distance: >3 FB Neck ROM: Full    Dental no notable dental hx.    Pulmonary Current Smoker and Patient abstained from smoking.   Pulmonary exam normal breath sounds clear to auscultation       Cardiovascular hypertension, + angina  + CAD and + Past MI  Normal cardiovascular exam Rhythm:Regular Rate:Normal     Neuro/Psych  Headaches PSYCHIATRIC DISORDERS  Depression     Neuromuscular disease    GI/Hepatic negative GI ROS, Neg liver ROS,,,  Endo/Other  diabetes    Renal/GU negative Renal ROS  negative genitourinary   Musculoskeletal negative musculoskeletal ROS (+)    Abdominal   Peds negative pediatric ROS (+)  Hematology negative hematology ROS (+)   Anesthesia Other Findings   Reproductive/Obstetrics negative OB ROS                              Anesthesia Physical Anesthesia Plan  ASA: 3  Anesthesia Plan: MAC   Post-op Pain Management:    Induction:   PONV Risk Score and Plan:   Airway Management Planned: Nasal Cannula  Additional Equipment:   Intra-op Plan:   Post-operative Plan:   Informed Consent: I have reviewed the patients History and Physical, chart, labs and discussed the procedure including the risks, benefits and alternatives for the proposed anesthesia with the patient or authorized representative who has indicated his/her understanding and acceptance.     Dental advisory given  Plan Discussed with: CRNA  Anesthesia Plan Comments:          Anesthesia Quick Evaluation

## 2024-06-11 NOTE — Op Note (Signed)
 Date of procedure: 06/11/24  Pre-operative diagnosis: Visually significant age-related nuclear cataract, Right Eye (H25.11)  Post-operative diagnosis: Visually significant age-related nuclear cataract, Right Eye  Procedure: Removal of cataract via phacoemulsification and insertion of intra-ocular lens Vicci and Johnson DIB00 +13.0D into the capsular bag of the Right Eye  Attending surgeon: Lynwood LABOR. Izea Livolsi, MD, MA  Anesthesia: MAC, Topical Akten  Complications: None  Estimated Blood Loss: <36mL (minimal)  Specimens: None  Implants: As above  Indications:  Visually significant age-related cataract, Right Eye  Procedure:  The patient was seen and identified in the pre-operative area. The operative eye was identified and dilated.  The operative eye was marked.  Topical anesthesia was administered to the operative eye.     The patient was then to the operative suite and placed in the supine position.  A timeout was performed confirming the patient, procedure to be performed, and all other relevant information.   The patient's face was prepped and draped in the usual fashion for intra-ocular surgery.  A lid speculum was placed into the operative eye and the surgical microscope moved into place and focused.  A superotemporal paracentesis was created using a 20 gauge paracentesis blade. Omidria was injected into the anterior chamber. Shugarcaine was injected into the anterior chamber.  Viscoelastic was injected into the anterior chamber.  A temporal clear-corneal main wound incision was created using a 2.58mm microkeratome.  A continuous curvilinear capsulorrhexis was initiated using an irrigating cystitome and completed using capsulorrhexis forceps.  Hydrodissection and hydrodeliniation were performed.  Viscoelastic was injected into the anterior chamber.  A phacoemulsification handpiece and a chopper as a second instrument were used to remove the nucleus and epinucleus. The irrigation/aspiration  handpiece was used to remove any remaining cortical material.   The capsular bag was reinflated with viscoelastic, checked, and found to be intact.  The intraocular lens was inserted into the capsular bag.  The irrigation/aspiration handpiece was used to remove any remaining viscoelastic.  The clear corneal wound and paracentesis wounds were then hydrated and checked with Weck-Cels to be watertight. 0.1mL of moxifloxacin was injected into the anterior chamber. The lid-speculum and drape was removed, and the patient's face was cleaned with a wet and dry 4x4.  A clear shield was taped over the eye. The patient was taken to the post-operative care unit in good condition, having tolerated the procedure well.  Post-Op Instructions: The patient will follow up at [facility] for a same day post-operative evaluation and will receive all other orders and instructions.

## 2024-06-11 NOTE — Transfer of Care (Signed)
 Immediate Anesthesia Transfer of Care Note  Patient: Michael Rivas  Procedure(s) Performed: PHACOEMULSIFICATION, CATARACT, WITH IOL INSERTION (Right: Eye)  Patient Location: PACU  Anesthesia Type:MAC  Level of Consciousness: awake and oriented  Airway & Oxygen Therapy: Patient Spontanous Breathing and Patient connected to nasal cannula oxygen  Post-op Assessment: Report given to RN and Post -op Vital signs reviewed and stable  Post vital signs: Reviewed and stable  Last Vitals:  Vitals Value Taken Time  BP    Temp    Pulse    Resp    SpO2      Last Pain:  Vitals:   06/11/24 0706  TempSrc: Oral  PainSc: 3       Patients Stated Pain Goal: 5 (06/11/24 0706)  Complications: No notable events documented.

## 2024-06-11 NOTE — Anesthesia Postprocedure Evaluation (Signed)
 Anesthesia Post Note  Patient: Michael Rivas  Procedure(s) Performed: PHACOEMULSIFICATION, CATARACT, WITH IOL INSERTION (Right: Eye)  Patient location during evaluation: Phase II Anesthesia Type: MAC Level of consciousness: awake Pain management: pain level controlled Vital Signs Assessment: post-procedure vital signs reviewed and stable Respiratory status: spontaneous breathing and respiratory function stable Cardiovascular status: blood pressure returned to baseline and stable Postop Assessment: no headache and no apparent nausea or vomiting Anesthetic complications: no Comments: Late entry   No notable events documented.   Last Vitals:  Vitals:   06/11/24 0706 06/11/24 0822  BP: 119/60 138/62  Pulse: (!) 50 (!) 49  Resp: 11 12  Temp: 36.4 C 36.6 C  SpO2: 100% 100%    Last Pain:  Vitals:   06/11/24 0822  TempSrc: Oral  PainSc: 0-No pain                 Yvonna JINNY Bosworth

## 2024-06-12 ENCOUNTER — Encounter (HOSPITAL_COMMUNITY): Payer: Self-pay | Admitting: Ophthalmology

## 2024-06-22 ENCOUNTER — Other Ambulatory Visit: Payer: Self-pay | Admitting: Cardiology

## 2024-06-22 DIAGNOSIS — I2510 Atherosclerotic heart disease of native coronary artery without angina pectoris: Secondary | ICD-10-CM

## 2024-06-27 ENCOUNTER — Encounter: Payer: Self-pay | Admitting: Cardiology

## 2024-06-27 ENCOUNTER — Ambulatory Visit: Attending: Cardiology | Admitting: Cardiology

## 2024-06-27 VITALS — BP 115/68 | HR 72 | Resp 17 | Ht 72.0 in | Wt 133.0 lb

## 2024-06-27 DIAGNOSIS — I2510 Atherosclerotic heart disease of native coronary artery without angina pectoris: Secondary | ICD-10-CM | POA: Diagnosis not present

## 2024-06-27 DIAGNOSIS — I10 Essential (primary) hypertension: Secondary | ICD-10-CM | POA: Diagnosis not present

## 2024-06-27 MED ORDER — ASPIRIN 81 MG PO TBEC: 81.0000 mg | DELAYED_RELEASE_TABLET | Freq: Every day | ORAL | 3 refills | Status: AC

## 2024-06-27 MED ORDER — AMLODIPINE BESYLATE 5 MG PO TABS: 5.0000 mg | ORAL_TABLET | Freq: Every day | ORAL | 3 refills | Status: AC

## 2024-06-27 MED ORDER — ATORVASTATIN CALCIUM 80 MG PO TABS: ORAL_TABLET | ORAL | 3 refills | Status: AC

## 2024-06-27 MED ORDER — METOPROLOL TARTRATE 25 MG PO TABS: 25.0000 mg | ORAL_TABLET | Freq: Two times a day (BID) | ORAL | 3 refills | Status: AC

## 2024-06-27 NOTE — Progress Notes (Signed)
 Cardiology Office Note:  .   Date:  06/27/2024  ID:  Michael Rivas, DOB [birth year], MRN [id number] PCP: Shona Norleen PEDLAR, MD  [facility] Providers Cardiologist:  Newman Lawrence, MD PCP: Shona Norleen PEDLAR, MD  Chief Complaint  Patient presents with   Coronary Artery Disease     Michael Rivas is a 68 y.o. male with hypertension, hyperlipidemia, CAD  Discussed the use of AI scribe software for clinical note transcription with the patient, who gave verbal consent to proceed.  History of Present Illness Michael Rivas is a 68 year old male with coronary artery disease who presents for a routine follow-up.  He reports no major health changes since his last visit over a year ago. He uses a cane and keeps walking to a minimum. He has no chest pain or shortness of breath.  He takes aspirin , a statin, amlodipine , lisinopril , and metoprolol . He ran out of these medications yesterday but had been taking them consistently before that.      Vitals:   06/27/24 1013  BP: 115/68  Pulse: 72  Resp: 17  SpO2: 100%      Review of Systems  Cardiovascular:  Negative for chest pain, dyspnea on exertion, leg swelling, palpitations and syncope.        Studies Reviewed: SABRA        EKG 06/27/2024: Normal sinus rhythm Biatrial enlargement Anteroseptal infarct , age undetermined When compared with ECG of 07-Jan-2020 11:33,  atrial enlargement is more pronounced    Echocardiogram 11/23/19:  1. Normal LV systolic function; grade 1 diastolic dysfunction.   2. Left ventricular ejection fraction, by estimation, is 60 to 65%. The  left ventricle has normal function. The left ventricle has no regional  wall motion abnormalities. Left ventricular diastolic parameters are  consistent with Grade I diastolic  dysfunction (impaired relaxation).   3. Right ventricular systolic function is normal. The right ventricular  size is normal.   4. The mitral valve is normal in structure. Trivial  mitral valve  regurgitation. No evidence of mitral stenosis.   5. The aortic valve is tricuspid. Aortic valve regurgitation is not  visualized. Mild aortic valve sclerosis is present, with no evidence of  aortic valve stenosis.   6. The inferior vena cava is normal in size with greater than 50%  respiratory variability, suggesting right atrial pressure of 3 mmHg.    Coronary angiogram/intervention 2019-11-23: LM: Distal 30%. dFR 1.0 LAD: Distal 30% stenosis Lcx: Severe tortuosity with moderate calcification.          Pros 60%. Mid 80-90% stenosis at LCx/OM         bifurcation ([facility] 1,1,0) Ostial OM 40% stenosis.  RCA: Long diffuse prox-mid 80% stenosis.          Distal RCA 40% stenosis          PDA diffuse 60% stenosis.    Complex procedure due to severity or tortuosity and calcification. Partially successful cutting balloon angioplasty prox-mid LCx Residual prox 40%, mid 40% stenosis at LCx/Om bifurcation, 60% calcific stenosis distal to bifurcation. This lesion remains inadequately prepared for stenting due to severe calcification.   Labs 05/2024: Chol 117, TG 47, HDL 52, LDL 54 HbA1C 6.5% Hb 13.3 Cr 0.8 TSH 2.2   Physical Exam Vitals and nursing note reviewed.  Constitutional:      General: He is not in acute distress. Neck:     Vascular: No JVD.  Cardiovascular:     Rate and Rhythm: Normal rate and  regular rhythm.     Heart sounds: Normal heart sounds. No murmur heard. Pulmonary:     Effort: Pulmonary effort is normal.     Breath sounds: Normal breath sounds. No wheezing or rales.  Musculoskeletal:     Right lower leg: No edema.     Left lower leg: No edema.      VISIT DIAGNOSES:   ICD-10-CM   1. Coronary artery disease involving native coronary artery of native heart without angina pectoris  I25.10 EKG 12-Lead    atorvastatin  (LIPITOR ) 80 MG tablet    metoprolol  tartrate (LOPRESSOR ) 25 MG tablet    2. Essential hypertension, benign  I10 EKG 12-Lead     amLODipine  (NORVASC ) 5 MG tablet       Michael Rivas is a 68 y.o. male with hypertension, hyperlipidemia, CAD Assessment & Plan CAD: No current anginal symptoms. Continue aspirin , statin.  Essential hypertension: Blood pressure controlled on amlodipine , losartan, and metoprolol . Medications obtained from [facility]. - Continue amlodipine , losartan, and metoprolol . - Refilled prescriptions for amlodipine , losartan, and metoprolol .         Meds ordered this encounter  Medications   amLODipine  (NORVASC ) 5 MG tablet    Sig: Take 1 tablet (5 mg total) by mouth daily.    Dispense:  90 tablet    Refill:  3   aspirin  EC 81 MG tablet    Sig: Take 1 tablet (81 mg total) by mouth daily. Swallow whole.    Dispense:  90 tablet    Refill:  3   atorvastatin  (LIPITOR ) 80 MG tablet    Sig: TAKE 1 TABLET BY MOUTH ONCE DAILY AT 6 PM.    Dispense:  90 tablet    Refill:  3   metoprolol  tartrate (LOPRESSOR ) 25 MG tablet    Sig: Take 1 tablet (25 mg total) by mouth 2 (two) times daily.    Dispense:  180 tablet    Refill:  3     F/u in 1 year  Signed, Newman JINNY Lawrence, MD

## 2024-06-27 NOTE — Patient Instructions (Signed)
 Follow-Up: At [facility], you and your health needs are our priority.  As part of our continuing mission to provide you with exceptional heart care, our providers are all part of one team.  This team includes your primary Cardiologist (physician) and Advanced Practice Providers or APPs (Physician Assistants and Nurse Practitioners) who all work together to provide you with the care you need, when you need it.  Your next appointment:   1 year(s)  Provider:   Cody Das, MD    We recommend signing up for the patient portal called "MyChart".  Sign up information is provided on this After Visit Summary.  MyChart is used to connect with patients for Virtual Visits (Telemedicine).  Patients are able to view lab/test results, encounter notes, upcoming appointments, etc.  Non-urgent messages can be sent to your provider as well.   To learn more about what you can do with MyChart, go to [email or web address].

## 2024-07-03 ENCOUNTER — Other Ambulatory Visit (HOSPITAL_COMMUNITY): Payer: Self-pay | Admitting: Nurse Practitioner

## 2024-07-03 DIAGNOSIS — Z122 Encounter for screening for malignant neoplasm of respiratory organs: Secondary | ICD-10-CM

## 2024-07-24 DIAGNOSIS — I2510 Atherosclerotic heart disease of native coronary artery without angina pectoris: Secondary | ICD-10-CM

## 2024-07-24 MED ORDER — LISINOPRIL 10 MG PO TABS: 10.0000 mg | ORAL_TABLET | Freq: Every evening | ORAL | 3 refills | Status: AC

## 2024-07-31 ENCOUNTER — Encounter (HOSPITAL_COMMUNITY): Payer: Self-pay

## 2024-07-31 ENCOUNTER — Ambulatory Visit (HOSPITAL_COMMUNITY): Admission: RE | Admit: 2024-07-31 | Source: Ambulatory Visit
